# Patient Record
Sex: Male | Born: 1960
Health system: Southern US, Community
[De-identification: ages and names within clinical notes are randomized; demographics above are authoritative.]

## PROBLEM LIST (undated history)

## (undated) DIAGNOSIS — R011 Cardiac murmur, unspecified: Secondary | ICD-10-CM

## (undated) DIAGNOSIS — M2550 Pain in unspecified joint: Secondary | ICD-10-CM

## (undated) DIAGNOSIS — K219 Gastro-esophageal reflux disease without esophagitis: Secondary | ICD-10-CM

## (undated) DIAGNOSIS — F329 Major depressive disorder, single episode, unspecified: Secondary | ICD-10-CM

## (undated) DIAGNOSIS — E785 Hyperlipidemia, unspecified: Secondary | ICD-10-CM

## (undated) DIAGNOSIS — E119 Type 2 diabetes mellitus without complications: Secondary | ICD-10-CM

## (undated) DIAGNOSIS — F32A Depression, unspecified: Secondary | ICD-10-CM

## (undated) DIAGNOSIS — T7840XA Allergy, unspecified, initial encounter: Secondary | ICD-10-CM

## (undated) DIAGNOSIS — I10 Essential (primary) hypertension: Secondary | ICD-10-CM

## (undated) DIAGNOSIS — G4730 Sleep apnea, unspecified: Secondary | ICD-10-CM

## (undated) HISTORY — DX: Hyperlipidemia, unspecified: E78.5

## (undated) HISTORY — PX: WISDOM TOOTH EXTRACTION: SHX21

## (undated) HISTORY — DX: Major depressive disorder, single episode, unspecified: F32.9

## (undated) HISTORY — DX: Pain in unspecified joint: M25.50

## (undated) HISTORY — DX: Gastro-esophageal reflux disease without esophagitis: K21.9

## (undated) HISTORY — DX: Cardiac murmur, unspecified: R01.1

## (undated) HISTORY — DX: Allergy, unspecified, initial encounter: T78.40XA

## (undated) HISTORY — DX: Depression, unspecified: F32.A

## (undated) HISTORY — PX: OTHER SURGICAL HISTORY: SHX169

## (undated) SURGERY — LEFT HEART CATH
Anesthesia: Moderate Sedation

---

## 2008-06-24 ENCOUNTER — Other Ambulatory Visit: Payer: Self-pay

## 2008-06-24 ENCOUNTER — Emergency Department: Payer: Self-pay | Admitting: Emergency Medicine

## 2014-06-25 ENCOUNTER — Ambulatory Visit: Payer: Self-pay | Admitting: General Practice

## 2015-06-18 ENCOUNTER — Encounter: Payer: Self-pay | Admitting: *Deleted

## 2015-06-21 ENCOUNTER — Encounter
Admission: RE | Disposition: A | Discharge status: Home / Self Care | Payer: Self-pay | Source: Ambulatory Visit | Attending: Gastroenterology

## 2015-06-21 ENCOUNTER — Ambulatory Visit: Payer: BLUE CROSS/BLUE SHIELD | Admitting: Anesthesiology

## 2015-06-21 ENCOUNTER — Ambulatory Visit
Admission: RE | Admit: 2015-06-21 | Discharge: 2015-06-21 | Disposition: A | Discharge status: Home / Self Care | Payer: BLUE CROSS/BLUE SHIELD | Source: Ambulatory Visit | Attending: Gastroenterology | Admitting: Gastroenterology

## 2015-06-21 ENCOUNTER — Encounter: Payer: Self-pay | Admitting: *Deleted

## 2015-06-21 DIAGNOSIS — E119 Type 2 diabetes mellitus without complications: Secondary | ICD-10-CM | POA: Diagnosis not present

## 2015-06-21 DIAGNOSIS — Z538 Procedure and treatment not carried out for other reasons: Secondary | ICD-10-CM | POA: Diagnosis not present

## 2015-06-21 DIAGNOSIS — Z8371 Family history of colonic polyps: Secondary | ICD-10-CM | POA: Insufficient documentation

## 2015-06-21 DIAGNOSIS — Z79899 Other long term (current) drug therapy: Secondary | ICD-10-CM | POA: Insufficient documentation

## 2015-06-21 DIAGNOSIS — Z1211 Encounter for screening for malignant neoplasm of colon: Secondary | ICD-10-CM | POA: Insufficient documentation

## 2015-06-21 DIAGNOSIS — G4730 Sleep apnea, unspecified: Secondary | ICD-10-CM | POA: Diagnosis not present

## 2015-06-21 HISTORY — PX: COLONOSCOPY WITH PROPOFOL: SHX5780

## 2015-06-21 HISTORY — DX: Sleep apnea, unspecified: G47.30

## 2015-06-21 HISTORY — DX: Type 2 diabetes mellitus without complications: E11.9

## 2015-06-21 LAB — GLUCOSE, CAPILLARY: Glucose-Capillary: 143 mg/dL — ABNORMAL HIGH (ref 65–99)

## 2015-06-21 SURGERY — COLONOSCOPY WITH PROPOFOL
Anesthesia: General

## 2015-06-21 MED ORDER — FENTANYL CITRATE (PF) 100 MCG/2ML IJ SOLN
INTRAMUSCULAR | Status: DC | PRN
  Administered 2015-06-21: 50 ug via INTRAVENOUS

## 2015-06-21 MED ORDER — SODIUM CHLORIDE 0.9 % IV SOLN
INTRAVENOUS | Status: DC
  Administered 2015-06-21: 08:00:00 via INTRAVENOUS
  Administered 2015-06-21: 1000 mL via INTRAVENOUS

## 2015-06-21 MED ORDER — SODIUM CHLORIDE 0.9 % IV SOLN: INTRAVENOUS | Status: DC

## 2015-06-21 MED ORDER — PROPOFOL 10 MG/ML IV BOLUS
INTRAVENOUS | Status: DC | PRN
  Administered 2015-06-21 (×2): 50 mg via INTRAVENOUS

## 2015-06-21 MED ORDER — PROPOFOL INFUSION 10 MG/ML OPTIME
INTRAVENOUS | Status: DC | PRN
  Administered 2015-06-21: 180 ug/kg/min via INTRAVENOUS

## 2015-06-21 MED ORDER — MIDAZOLAM HCL 5 MG/5ML IJ SOLN
INTRAMUSCULAR | Status: DC | PRN
  Administered 2015-06-21: 1 mg via INTRAVENOUS

## 2015-06-21 MED ORDER — LIDOCAINE HCL (CARDIAC) 20 MG/ML IV SOLN
INTRAVENOUS | Status: DC | PRN
  Administered 2015-06-21: 30 mg via INTRAVENOUS

## 2015-06-21 NOTE — Anesthesia Preprocedure Evaluation (Signed)
Anesthesia Evaluation  Patient identified by MRN, date of birth, ID band Patient awake    Reviewed: Allergy & Precautions, H&P , NPO status , Patient's Chart, lab work & pertinent test results, reviewed documented beta blocker date and time   Airway Mallampati: III  TM Distance: >3 FB Neck ROM: full    Dental no notable dental hx.    Pulmonary neg pulmonary ROS, sleep apnea ,  breath sounds clear to auscultation  Pulmonary exam normal       Cardiovascular Exercise Tolerance: Good negative cardio ROS  Rhythm:regular Rate:Normal     Neuro/Psych negative neurological ROS  negative psych ROS   GI/Hepatic negative GI ROS, Neg liver ROS,   Endo/Other  negative endocrine ROSdiabetes  Renal/GU negative Renal ROS  negative genitourinary   Musculoskeletal   Abdominal   Peds  Hematology negative hematology ROS (+)   Anesthesia Other Findings   Reproductive/Obstetrics negative OB ROS                             Anesthesia Physical Anesthesia Plan  ASA: III  Anesthesia Plan: General   Post-op Pain Management:    Induction:   Airway Management Planned:   Additional Equipment:   Intra-op Plan:   Post-operative Plan:   Informed Consent: I have reviewed the patients History and Physical, chart, labs and discussed the procedure including the risks, benefits and alternatives for the proposed anesthesia with the patient or authorized representative who has indicated his/her understanding and acceptance.   Dental Advisory Given  Plan Discussed with: CRNA  Anesthesia Plan Comments:         Anesthesia Quick Evaluation

## 2015-06-21 NOTE — Addendum Note (Signed)
Addendum  created 06/21/15 0934 by Charna Busman, CRNA   Modules edited: Anesthesia Attestations, Notes Section   Notes Section:  Pend: [id number]

## 2015-06-21 NOTE — Anesthesia Postprocedure Evaluation (Signed)
  Anesthesia Post-op Note  Patient: Kevin Reid  Procedure(s) Performed: Procedure(s): COLONOSCOPY WITH PROPOFOL (N/A)  Anesthesia type:General  Patient location: PACU  Post pain: Pain level controlled  Post assessment: Post-op Vital signs reviewed, Patient's Cardiovascular Status Stable, Respiratory Function Stable, Patent Airway and No signs of Nausea or vomiting  Post vital signs: Reviewed and stable  Last Vitals:  Filed Vitals:   06/21/15 0904  BP:   Pulse: 45  Temp: 35.8 C  Resp: 16    Level of consciousness: awake, alert  and patient cooperative  Complications: No apparent anesthesia complications

## 2015-06-21 NOTE — Op Note (Signed)
[facility] Gastroenterology Patient Name: Kevin Reid Procedure Date: 06/21/2015 7:48 AM MRN: [id number] Account #: [id number] Date of Birth: Oct 17, 1961 Admit Type: Outpatient Age: 54 Room: [facility] ENDO ROOM 2 Gender: Male Note Status: Finalized Procedure:         Colonoscopy Indications:       Family history of colonic polyps in a first-degree relative Providers:         Christena Deem, MD Medicines:         Monitored Anesthesia Care Complications:     No immediate complications. Procedure:         Pre-Anesthesia Assessment:                    - ASA Grade Assessment: III - A patient with severe                     systemic disease.                    After obtaining informed consent, the colonoscope was                     passed under direct vision. Throughout the procedure, the                     patient's blood pressure, pulse, and oxygen saturations                     were monitored continuously. The Olympus PCF-160AL                     colonoscope (S#. [id number]) was introduced through the anus                     with the intention of advancing to the cecum. The scope                     was advanced to the sigmoid colon before the procedure was                     aborted. Medications were given. The patient tolerated the                     procedure well. The quality of the bowel preparation was                     poor. Findings:      aborted due in mid to upper sigmoid to poor prep. Impression:        - Preparation of the colon was poor.                    - No specimens collected.                    - The procedure was aborted due to poor bowel prep with                     stool present. Recommendation:    - Discharge patient to home.                    - Put patient on a clear liquid diet starting today.                    - will need repeat prep  and rescheduling. Procedure Code(s): --- Professional ---                    [phone],  Sigmoidoscopy, flexible; diagnostic, including                     collection of specimen(s) by brushing or washing, when                     performed (separate procedure) Diagnosis Code(s): --- Professional ---                    V18.51, Family history of colonic polyps                    V64.3, Procedure not carried out for other reasons CPT copyright 2014 American [employer]. All rights reserved. The codes documented in this report are preliminary and upon coder review may  be revised to meet current compliance requirements. Christena Deem, MD 06/21/2015 8:52:34 AM This report has been signed electronically. Number of Addenda: 0 Note Initiated On: 06/21/2015 7:48 AM Total Procedure Duration: 0 hours 6 minutes 54 seconds       [facility]

## 2015-06-21 NOTE — Transfer of Care (Signed)
Immediate Anesthesia Transfer of Care Note  Patient: Kevin GreeningJeffrey Reid  Procedure(s) Performed: Procedure(s): COLONOSCOPY WITH PROPOFOL (N/A)  Patient Location: PACU and Endoscopy Unit  Anesthesia Type:General  Level of Consciousness: awake, oriented and patient cooperative  Airway & Oxygen Therapy: Patient Spontanous Breathing and Patient connected to nasal cannula oxygen  Post-op Assessment: Report given to RN  Post vital signs: Reviewed and stable  Last Vitals:  Filed Vitals:   06/21/15 0904  BP:   Pulse: 45  Temp: 35.8 C  Resp: 16    Complications: No apparent anesthesia complications

## 2015-06-21 NOTE — Anesthesia Postprocedure Evaluation (Signed)
  Anesthesia Post-op Note  Patient: Kevin Reid  Procedure(s) Performed: Procedure(s): COLONOSCOPY WITH PROPOFOL (N/A)  Anesthesia type:General  Patient location: PACU  Post pain: Pain level controlled  Post assessment: Post-op Vital signs reviewed, Patient's Cardiovascular Status Stable, Respiratory Function Stable, Patent Airway and No signs of Nausea or vomiting  Post vital signs: Reviewed and stable  Last Vitals:  Filed Vitals:   06/21/15 0910  BP: 138/88  Pulse: 44  Temp:   Resp: 11    Level of consciousness: awake, alert  and patient cooperative  Complications: No apparent anesthesia complications

## 2015-06-21 NOTE — H&P (Signed)
Outpatient short stay form Pre-procedure 06/21/2015 8:12 AM Christena Deem MD  Primary Physician: Dr. Jabier Mutton  Reason for visit:  Colonoscopy  History of present illness:  Patient is a 54 year old Caucasian male who is presenting today for a screening colonoscopy. This is his first colonoscopy. He does have a family history of colon polyps and a primary relative. He tolerated his prep well. He is not currently taking any aspirin or the coagulates.    Current facility-administered medications:  .  0.9 %  sodium chloride infusion, , Intravenous, Continuous, Christena Deem, MD, Last Rate: 50 mL/hr at 06/21/15 0753, 1,000 mL at 06/21/15 0753 .  0.9 %  sodium chloride infusion, , Intravenous, Continuous, Christena Deem, MD  Prescriptions prior to admission  Medication Sig Dispense Refill Last Dose  . amLODipine (NORVASC) 10 MG tablet Take 10 mg by mouth daily.  3   . bisoprolol-hydrochlorothiazide (ZIAC) 5-6.25 MG per tablet Take 2 tablets by mouth daily.  3   . fenofibrate 160 MG tablet Take 160 mg by mouth daily.  11   . metFORMIN (GLUCOPHAGE) 1000 MG tablet Take 1,000 mg by mouth 2 (two) times daily.  1   . ONGLYZA 5 MG TABS tablet Take 5 mg by mouth daily.  99   . PARoxetine (PAXIL) 20 MG tablet TAKE 3 TABLETS EVERY DAY  12      No Known Allergies   Past Medical History  Diagnosis Date  . Diabetes mellitus without complication   . Sleep apnea     Review of systems:      Physical Exam    Heart and lungs: Regular rate and rhythm without rub or gallop, lungs are bilaterally clear    HEENT: Normocephalic atraumatic eyes are anicteric    Other:     Pertinant exam for procedure: Soft nontender nondistended bowel sounds positive normoactive    Planned proceedures: Colonoscopy and indicated procedures I have discussed the risks benefits and complications of procedures to include not limited to bleeding, infection, perforation and the risk of sedation and the  patient wishes to proceed.    Christena Deem, MD Gastroenterology 06/21/2015  8:12 AM

## 2015-06-21 NOTE — Anesthesia Postprocedure Evaluation (Signed)
  Anesthesia Post-op Note  Patient: Kevin Reid  Procedure(s) Performed: Procedure(s): COLONOSCOPY WITH PROPOFOL (N/A)  Anesthesia type:General  Patient location: PACU  Post pain: Pain level controlled  Post assessment: Post-op Vital signs reviewed, Patient's Cardiovascular Status Stable, Respiratory Function Stable, Patent Airway and No signs of Nausea or vomiting  Post vital signs: Reviewed and stable  Last Vitals:  Filed Vitals:   06/21/15 0910  BP: 138/88  Pulse: 44  Temp:   Resp: 11    Level of consciousness: awake, alert  and patient cooperative  Complications: No apparent anesthesia complications  

## 2015-07-20 ENCOUNTER — Ambulatory Visit: Payer: BLUE CROSS/BLUE SHIELD | Admitting: Anesthesiology

## 2015-07-20 ENCOUNTER — Encounter
Admission: RE | Disposition: A | Discharge status: Home / Self Care | Payer: Self-pay | Source: Ambulatory Visit | Attending: Gastroenterology

## 2015-07-20 ENCOUNTER — Ambulatory Visit
Admission: RE | Admit: 2015-07-20 | Discharge: 2015-07-20 | Disposition: A | Discharge status: Home / Self Care | Payer: BLUE CROSS/BLUE SHIELD | Source: Ambulatory Visit | Attending: Gastroenterology | Admitting: Gastroenterology

## 2015-07-20 ENCOUNTER — Encounter: Payer: Self-pay | Admitting: *Deleted

## 2015-07-20 DIAGNOSIS — Z8371 Family history of colonic polyps: Secondary | ICD-10-CM | POA: Diagnosis not present

## 2015-07-20 DIAGNOSIS — G4730 Sleep apnea, unspecified: Secondary | ICD-10-CM | POA: Diagnosis not present

## 2015-07-20 DIAGNOSIS — Z1211 Encounter for screening for malignant neoplasm of colon: Secondary | ICD-10-CM | POA: Insufficient documentation

## 2015-07-20 DIAGNOSIS — E119 Type 2 diabetes mellitus without complications: Secondary | ICD-10-CM | POA: Diagnosis not present

## 2015-07-20 HISTORY — PX: COLONOSCOPY WITH PROPOFOL: SHX5780

## 2015-07-20 LAB — GLUCOSE, CAPILLARY: GLUCOSE-CAPILLARY: 142 mg/dL — AB (ref 65–99)

## 2015-07-20 SURGERY — COLONOSCOPY WITH PROPOFOL
Anesthesia: General

## 2015-07-20 MED ORDER — SODIUM CHLORIDE 0.9 % IV SOLN: INTRAVENOUS | Status: DC

## 2015-07-20 MED ORDER — GLYCOPYRROLATE 0.2 MG/ML IJ SOLN
INTRAMUSCULAR | Status: DC | PRN
  Administered 2015-07-20: 0.2 mg via INTRAVENOUS

## 2015-07-20 MED ORDER — LACTATED RINGERS IV SOLN
INTRAVENOUS | Status: DC | PRN
  Administered 2015-07-20: 15:00:00 via INTRAVENOUS

## 2015-07-20 MED ORDER — PROPOFOL 10 MG/ML IV BOLUS
INTRAVENOUS | Status: DC | PRN
  Administered 2015-07-20: 430 mg via INTRAVENOUS

## 2015-07-20 MED ORDER — SODIUM CHLORIDE 0.9 % IV SOLN
INTRAVENOUS | Status: DC
  Administered 2015-07-20: 14:00:00 via INTRAVENOUS

## 2015-07-20 NOTE — Anesthesia Preprocedure Evaluation (Addendum)
Anesthesia Evaluation  Patient identified by MRN, date of birth, ID band Patient awake    Reviewed: Allergy & Precautions, NPO status , Patient's Chart, lab work & pertinent test results, reviewed documented beta blocker date and time   Airway Mallampati: II  TM Distance: >3 FB     Dental  (+) Chipped   Pulmonary sleep apnea ,          Cardiovascular     Neuro/Psych    GI/Hepatic   Endo/Other  diabetes, Type 2  Renal/GU      Musculoskeletal   Abdominal   Peds  Hematology   Anesthesia Other Findings He will use CPAP tonite. There will be someone with him tonite.  Reproductive/Obstetrics                            Anesthesia Physical Anesthesia Plan  ASA: II  Anesthesia Plan: General   Post-op Pain Management:    Induction: Intravenous  Airway Management Planned: Nasal Cannula  Additional Equipment:   Intra-op Plan:   Post-operative Plan:   Informed Consent: I have reviewed the patients History and Physical, chart, labs and discussed the procedure including the risks, benefits and alternatives for the proposed anesthesia with the patient or authorized representative who has indicated his/her understanding and acceptance.     Plan Discussed with: CRNA  Anesthesia Plan Comments:         Anesthesia Quick Evaluation

## 2015-07-20 NOTE — Op Note (Signed)
[facility] Gastroenterology Patient Name: Kevin Reid Procedure Date: 07/20/2015 2:24 PM MRN: [id number] Account #: [id number] Date of Birth: 1961-12-24 Admit Type: Outpatient Age: 54 Room: [facility] ENDO ROOM 4 Gender: Male Note Status: Finalized Procedure:         Colonoscopy Indications:       Colon cancer screening in patient at increased risk:                     Family history of colon polyps Providers:         Christena Deem, MD Referring MD:      Jabier Mutton, MD (Referring MD) Medicines:         Monitored Anesthesia Care Complications:     No immediate complications. Procedure:         Pre-Anesthesia Assessment:                    - ASA Grade Assessment: III - A patient with severe                     systemic disease.                    After obtaining informed consent, the colonoscope was                     passed under direct vision. Throughout the procedure, the                     patient's blood pressure, pulse, and oxygen saturations                     were monitored continuously. The Colonoscope was                     introduced through the anus and advanced to the the cecum,                     identified by appendiceal orifice and ileocecal valve. The                     colonoscopy was performed without difficulty. The patient                     tolerated the procedure well. The quality of the bowel                     preparation was good. Findings:      The colon (entire examined portion) appeared normal. Very mild melanosis       coli throughout.      The retroflexed view of the distal rectum and anal verge was normal and       showed no anal or rectal abnormalities.      The digital rectal exam was normal. Impression:        - The entire examined colon is normal.                    - The distal rectum and anal verge are normal on                     retroflexion view.                    - No specimens  collected. Recommendation:    - Repeat  colonoscopy in 5 years for screening purposes. Procedure Code(s): --- Professional ---                    [phone], Colonoscopy, flexible; diagnostic, including                     collection of specimen(s) by brushing or washing, when                     performed (separate procedure) Diagnosis Code(s): --- Professional ---                    V18.51, Family history of colonic polyps CPT copyright 2014 American Medical Association. All rights reserved. The codes documented in this report are preliminary and upon coder review may  be revised to meet current compliance requirements. Christena Deem, MD 07/20/2015 3:06:42 PM This report has been signed electronically. Number of Addenda: 0 Note Initiated On: 07/20/2015 2:24 PM Scope Withdrawal Time: 0 hours 9 minutes 25 seconds  Total Procedure Duration: 0 hours 19 minutes 16 seconds       [facility]

## 2015-07-20 NOTE — Transfer of Care (Signed)
Immediate Anesthesia Transfer of Care Note  Patient: Kevin Reid  Procedure(s) Performed: Procedure(s): COLONOSCOPY WITH PROPOFOL (N/A)  Patient Location: PACU  Anesthesia Type:General  Level of Consciousness: awake, alert  and oriented  Airway & Oxygen Therapy: Patient Spontanous Breathing and Patient connected to nasal cannula oxygen  Post-op Assessment: Report given to RN and Post -op Vital signs reviewed and stable  Post vital signs: Reviewed and stable  Last Vitals:  Filed Vitals:   07/20/15 1338  BP: 138/86  Pulse: 45  Temp: 36.6 C  Resp: 18    Complications: No apparent anesthesia complications

## 2015-07-20 NOTE — H&P (Addendum)
Outpatient short stay form Pre-procedure 07/20/2015 2:34 PM Kevin Deem MD  Primary Physician: Dr. Jabier Mutton  Reason for visit:  Colonoscopy  History of present illness:  Patient is a 54 year old male who is presenting today for screening colonoscopy. This would be a high risk screening since his is a family history of colon polyps in his father. He tolerated his prep well. The last time he took his 81 mg aspirin was 2 days ago. He has not taken any recent things like Goody powders etc. He does not take any anticoagulates drugs.    Current facility-administered medications:  .  0.9 %  sodium chloride infusion, , Intravenous, Continuous, Kevin Deem, MD, Last Rate: 50 mL/hr at 07/20/15 1353 .  0.9 %  sodium chloride infusion, , Intravenous, Continuous, Kevin Deem, MD  Prescriptions prior to admission  Medication Sig Dispense Refill Last Dose  . amLODipine (NORVASC) 10 MG tablet Take 10 mg by mouth daily.  3   . bisoprolol-hydrochlorothiazide (ZIAC) 5-6.25 MG per tablet Take 2 tablets by mouth daily.  3   . fenofibrate 160 MG tablet Take 160 mg by mouth daily.  11   . metFORMIN (GLUCOPHAGE) 1000 MG tablet Take 1,000 mg by mouth 2 (two) times daily.  1   . ONGLYZA 5 MG TABS tablet Take 5 mg by mouth daily.  99   . PARoxetine (PAXIL) 20 MG tablet TAKE 3 TABLETS EVERY DAY  12      No Known Allergies   Past Medical History  Diagnosis Date  . Diabetes mellitus without complication   . Sleep apnea     Review of systems:      Physical Exam    Heart and lungs: Regular rate and rhythm without rub murmur gallop, lungs are bilaterally clear    HEENT: Norm cephalic atraumatic eyes are anicteric    Other:     Pertinant exam for procedure: Protuberant soft nontender nondistended bowel sounds positive normoactive    Planned proceedures: Colonoscopy and indicated procedures.    Kevin Deem, MD Gastroenterology 07/20/2015  2:34 PM     I have  discussed the risks benefits and complications of procedures to include not limited to bleeding, infection, perforation and the risk of sedation and the patient wishes to proceed.

## 2015-07-20 NOTE — Anesthesia Postprocedure Evaluation (Signed)
  Anesthesia Post-op Note  Patient: Kevin Reid  Procedure(s) Performed: Procedure(s): COLONOSCOPY WITH PROPOFOL (N/A)  Anesthesia type:General  Patient location: PACU  Post pain: Pain level controlled  Post assessment: Post-op Vital signs reviewed, Patient's Cardiovascular Status Stable, Respiratory Function Stable, Patent Airway and No signs of Nausea or vomiting  Post vital signs: Reviewed and stable  Last Vitals:  Filed Vitals:   07/20/15 1537  BP: 150/90  Pulse: 51  Temp:   Resp: 20    Level of consciousness: awake, alert  and patient cooperative  Complications: No apparent anesthesia complications

## 2015-07-23 ENCOUNTER — Encounter: Payer: Self-pay | Admitting: Gastroenterology

## 2015-08-17 ENCOUNTER — Other Ambulatory Visit: Payer: Self-pay | Admitting: Physician Assistant

## 2015-11-15 ENCOUNTER — Other Ambulatory Visit: Payer: Self-pay | Admitting: Physician Assistant

## 2016-02-24 ENCOUNTER — Other Ambulatory Visit: Payer: Self-pay | Admitting: Physician Assistant

## 2016-03-01 ENCOUNTER — Other Ambulatory Visit: Payer: Self-pay | Admitting: Physician Assistant

## 2016-10-04 ENCOUNTER — Other Ambulatory Visit: Payer: Self-pay | Admitting: Physician Assistant

## 2016-10-04 NOTE — Telephone Encounter (Signed)
May not be your patient.

## 2016-10-13 ENCOUNTER — Other Ambulatory Visit: Payer: Self-pay | Admitting: Physician Assistant

## 2016-10-18 NOTE — Addendum Note (Signed)
Addendum  created 10/18/16 1239 by Charna Busmanhomas Tavita Eastham, CRNA   Delete clinical note

## 2016-12-23 ENCOUNTER — Other Ambulatory Visit: Payer: Self-pay | Admitting: Physician Assistant

## 2017-08-14 DIAGNOSIS — E113393 Type 2 diabetes mellitus with moderate nonproliferative diabetic retinopathy without macular edema, bilateral: Secondary | ICD-10-CM | POA: Diagnosis not present

## 2017-11-23 DIAGNOSIS — F422 Mixed obsessional thoughts and acts: Secondary | ICD-10-CM | POA: Diagnosis not present

## 2018-03-04 DIAGNOSIS — F4322 Adjustment disorder with anxiety: Secondary | ICD-10-CM | POA: Diagnosis not present

## 2018-04-09 DIAGNOSIS — F4322 Adjustment disorder with anxiety: Secondary | ICD-10-CM | POA: Diagnosis not present

## 2018-05-09 LAB — BASIC METABOLIC PANEL
BUN: 18 (ref 4–21)
Potassium: 4.1 (ref 3.4–5.3)
SODIUM: 140 (ref 137–147)

## 2018-05-09 LAB — LIPID PANEL
Cholesterol: 161 (ref 0–200)
HDL: 28 — AB (ref 35–70)
LDL Cholesterol: 76
LDl/HDL Ratio: 5.8
Triglycerides: 286 — AB (ref 40–160)

## 2018-05-09 LAB — TSH: TSH: 1.7 (ref 0.41–5.90)

## 2018-05-09 LAB — HEMOGLOBIN A1C: Hemoglobin A1C: 6.5

## 2018-07-07 ENCOUNTER — Other Ambulatory Visit: Payer: Self-pay

## 2018-07-07 ENCOUNTER — Emergency Department
Admission: EM | Admit: 2018-07-07 | Discharge: 2018-07-07 | Disposition: A | Discharge status: Home / Self Care | Payer: BLUE CROSS/BLUE SHIELD | Attending: Emergency Medicine | Admitting: Emergency Medicine

## 2018-07-07 ENCOUNTER — Emergency Department: Payer: BLUE CROSS/BLUE SHIELD

## 2018-07-07 DIAGNOSIS — I10 Essential (primary) hypertension: Secondary | ICD-10-CM | POA: Diagnosis not present

## 2018-07-07 DIAGNOSIS — E119 Type 2 diabetes mellitus without complications: Secondary | ICD-10-CM | POA: Diagnosis not present

## 2018-07-07 DIAGNOSIS — Z79899 Other long term (current) drug therapy: Secondary | ICD-10-CM | POA: Insufficient documentation

## 2018-07-07 DIAGNOSIS — R079 Chest pain, unspecified: Secondary | ICD-10-CM | POA: Diagnosis present

## 2018-07-07 DIAGNOSIS — R1013 Epigastric pain: Secondary | ICD-10-CM | POA: Diagnosis not present

## 2018-07-07 DIAGNOSIS — R0602 Shortness of breath: Secondary | ICD-10-CM | POA: Diagnosis not present

## 2018-07-07 DIAGNOSIS — Z7984 Long term (current) use of oral hypoglycemic drugs: Secondary | ICD-10-CM | POA: Diagnosis not present

## 2018-07-07 DIAGNOSIS — R0789 Other chest pain: Secondary | ICD-10-CM | POA: Diagnosis not present

## 2018-07-07 HISTORY — DX: Essential (primary) hypertension: I10

## 2018-07-07 LAB — CBC
HEMATOCRIT: 43 % (ref 40.0–52.0)
Hemoglobin: 15.4 g/dL (ref 13.0–18.0)
MCH: 31.3 pg (ref 26.0–34.0)
MCHC: 35.8 g/dL (ref 32.0–36.0)
MCV: 87.3 fL (ref 80.0–100.0)
PLATELETS: 172 10*3/uL (ref 150–440)
RBC: 4.92 MIL/uL (ref 4.40–5.90)
RDW: 13.5 % (ref 11.5–14.5)
WBC: 11.9 10*3/uL — ABNORMAL HIGH (ref 3.8–10.6)

## 2018-07-07 LAB — BASIC METABOLIC PANEL
Anion gap: 11 (ref 5–15)
BUN: 22 mg/dL — AB (ref 6–20)
CO2: 27 mmol/L (ref 22–32)
CREATININE: 1.11 mg/dL (ref 0.61–1.24)
Calcium: 9.2 mg/dL (ref 8.9–10.3)
Chloride: 100 mmol/L (ref 98–111)
GLUCOSE: 166 mg/dL — AB (ref 70–99)
Potassium: 3.5 mmol/L (ref 3.5–5.1)
Sodium: 138 mmol/L (ref 135–145)

## 2018-07-07 LAB — HEPATIC FUNCTION PANEL
ALT: 28 U/L (ref 0–44)
AST: 23 U/L (ref 15–41)
Albumin: 4.4 g/dL (ref 3.5–5.0)
Alkaline Phosphatase: 43 U/L (ref 38–126)
BILIRUBIN TOTAL: 0.7 mg/dL (ref 0.3–1.2)
Bilirubin, Direct: <0.1 mg/dL (ref 0.0–0.2)
Total Protein: 7 g/dL (ref 6.5–8.1)

## 2018-07-07 LAB — LIPASE, BLOOD: LIPASE: 26 U/L (ref 11–51)

## 2018-07-07 LAB — TROPONIN I
Troponin I: <0.03 ng/mL (ref <0.03)
Troponin I: <0.03 ng/mL (ref <0.03)

## 2018-07-07 NOTE — ED Notes (Signed)
First Nurse Note: Pt c/o chest discomfort and fatigue. Pt is in NAD at this time.

## 2018-07-07 NOTE — ED Triage Notes (Signed)
Pt arrives to ED alert, oriented, ambulatory. States central CP since 3:30 this AM with diarrhea. Denies any other symptoms. Does not radiate.

## 2018-07-07 NOTE — ED Provider Notes (Addendum)
[facility] Emergency Department Provider Note  ____________________________________________   I have reviewed the triage vital signs and the nursing notes. Where available I have reviewed prior notes and, if possible and indicated, outside hospital notes.    HISTORY  Chief Complaint Chest Pain    HPI Kevin Reid is a 57 y.o. male with a history of diabetes mellitus hypertension, also drinks 10-12 beers on the weekend, and a time.  History of tobacco abuse, 1 pack/month approximately.  Presents today complaining of having had an epigastric and lower chest discomfort while he was sleeping last night.  He was sound asleep and he woke up felt this discomfort which is like a tightness, it lasted for a few minutes, felt he had to burp, then he had diarrhea.  Nonbloody nonbilious.  After the diarrhea he felt better went back to bed.  He did have several beers yesterday, and he had very spicy [place] food.  He denies any radiation of the discomfort he denies any actual chest pain he just felt a little bit tight.  He denies any exertional symptoms.  Patient goes up and down stairs regularly at work without any dyspnea or chest pain.  He states he had something similar like this means lying down several months ago in similar situation.  He denies any ongoing symptoms since that time.  This happened at 330 he is been without discomfort since then.  He does not have any recurrent epigastric or abdominal pain.  He has no food related pain.  He does not regularly take antiacid's.  He states that the food that he had was "very spicy" and he did wash down with "several beers" he has no personal or family history of PE, DVT, or ACS.  His parents are both alive and well and his father has prostate disorder. States he had a negative colonoscopy but not had an endoscopy. He had the same episode while sleeping several months ago, EMS came he had a normal EKG and he was not transported at  that time.  He has had no intervening symptoms.  Again he is able to exert himself with no difficulty  Past Medical History:  Diagnosis Date  . Diabetes mellitus without complication (HCC)   . Hypertension   . Sleep apnea     There are no active problems to display for this patient.   Past Surgical History:  Procedure Laterality Date  . carpel tunnel    . COLONOSCOPY WITH PROPOFOL N/A 06/21/2015   Procedure: COLONOSCOPY WITH PROPOFOL;  Surgeon: Christena Deem, MD;  Location: [facility] ENDOSCOPY;  Service: Endoscopy;  Laterality: N/A;  . COLONOSCOPY WITH PROPOFOL N/A 07/20/2015   Procedure: COLONOSCOPY WITH PROPOFOL;  Surgeon: Christena Deem, MD;  Location: [facility] ENDOSCOPY;  Service: Endoscopy;  Laterality: N/A;    Prior to Admission medications   Medication Sig Start Date End Date Taking? Authorizing Provider  amLODipine (NORVASC) 10 MG tablet Take 10 mg by mouth daily. 05/24/15   [provider]  bisoprolol-hydrochlorothiazide (ZIAC) 5-6.25 MG per tablet Take 2 tablets by mouth daily. 06/09/15   [provider]  fenofibrate 160 MG tablet Take 160 mg by mouth daily. 05/29/15   [provider]  metFORMIN (GLUCOPHAGE) 1000 MG tablet Take 1,000 mg by mouth 2 (two) times daily. 04/06/15   [provider]  ONGLYZA 5 MG TABS tablet Take 5 mg by mouth daily. 05/03/15   [provider]  PARoxetine (PAXIL) 20 MG tablet TAKE 3 TABLETS  EVERY DAY 03/11/15   [provider]    Allergies Patient has no known allergies.  History reviewed. No pertinent family history.  Social History Social History   Tobacco Use  . Smoking status: Never Smoker  Substance Use Topics  . Alcohol use: Yes  . Drug use: No    Review of Systems Constitutional: No fever/chills Eyes: No visual changes. ENT: No sore throat. No stiff neck no neck pain Cardiovascular: See HPI regarding chest pain. Respiratory: Denies shortness of breath. Gastrointestinal:   no  vomiting.  No diarrhea.  No constipation. Genitourinary: Negative for dysuria. Musculoskeletal: Negative lower extremity swelling Skin: Negative for rash. Neurological: Negative for severe headaches, focal weakness or numbness.   ____________________________________________   PHYSICAL EXAM:  VITAL SIGNS: ED Triage Vitals  Enc Vitals Group     BP 07/07/18 0921 125/67     Pulse Rate 07/07/18 0921 (!) 53     Resp 07/07/18 0921 16     Temp 07/07/18 0921 98.2 F (36.8 C)     Temp Source 07/07/18 0921 Oral     SpO2 07/07/18 0921 95 %     Weight 07/07/18 0915 240 lb (108.9 kg)     Height 07/07/18 0915 6' (1.829 m)     Head Circumference --      Peak Flow --      Pain Score 07/07/18 0915 1     Pain Loc --      Pain Edu? --      Excl. in GC? --     Constitutional: Alert and oriented. Well appearing and in no acute distress. Eyes: Conjunctivae are normal Head: Atraumatic HEENT: No congestion/rhinnorhea. Mucous membranes are moist.  Oropharynx non-erythematous Neck:   Nontender with no meningismus, no masses, no stridor Cardiovascular: Normal rate, regular rhythm. Grossly normal heart sounds.  Good peripheral circulation. Respiratory: Normal respiratory effort.  No retractions. Lungs CTAB. Abdominal: Soft and nontender. No distention. No guarding no rebound Back:  There is no focal tenderness or step off.  there is no midline tenderness there are no lesions noted. there is no CVA tenderness Musculoskeletal: No lower extremity tenderness, no upper extremity tenderness. No joint effusions, no DVT signs strong distal pulses no edema Neurologic:  Normal speech and language. No gross focal neurologic deficits are appreciated.  Skin:  Skin is warm, dry and intact. No rash noted. Psychiatric: Mood and affect are normal. Speech and behavior are normal.  ____________________________________________   LABS (all labs ordered are listed, but only abnormal results are displayed)  Labs  Reviewed  BASIC METABOLIC PANEL - Abnormal; Notable for the following components:      Result Value   Glucose, Bld 166 (*)    BUN 22 (*)    All other components within normal limits  CBC - Abnormal; Notable for the following components:   WBC 11.9 (*)    All other components within normal limits  TROPONIN I  HEPATIC FUNCTION PANEL  LIPASE, BLOOD  TROPONIN I    Pertinent labs  results that were available during my care of the patient were reviewed by me and considered in my medical decision making (see chart for details). ____________________________________________  EKG  I personally interpreted any EKGs ordered by me or triage Sinus bradycardia rate 56, no acute ST elevation or depression, LAD noted, no acute ischemia. ____________________________________________  RADIOLOGY  Pertinent labs & imaging results that were available during my care of the patient were reviewed by me and considered in my  medical decision making (see chart for details). If possible, patient and/or family made aware of any abnormal findings.  Dg Chest 2 View  Result Date: 07/07/2018 CLINICAL DATA:  57 year old male with history of central chest pressures since this morning. No associated shortness of breath. EXAM: CHEST - 2 VIEW COMPARISON:  Chest x-ray 06/24/2018. FINDINGS: Lung volumes are normal. No consolidative airspace disease. No pleural effusions. No pneumothorax. No pulmonary nodule or mass noted. Pulmonary vasculature and the cardiomediastinal silhouette are within normal limits. IMPRESSION: No radiographic evidence of acute cardiopulmonary disease. Electronically Signed   By: Trudie Reed M.D.   On: 07/07/2018 10:03   ____________________________________________    PROCEDURES  Procedure(s) performed: None  Procedures  Critical Care performed: None  ____________________________________________   INITIAL IMPRESSION / ASSESSMENT AND PLAN / ED COURSE  Pertinent labs & imaging results  that were available during my care of the patient were reviewed by me and considered in my medical decision making (see chart for details).  Patient here with very atypical story of epigastric/lower chest discomfort after spicy food and beer accompanied by single episode of nonbloody nonbilious diarrhea.  This seems to be mostly GI in character, we have sent a second troponin, liver function tests are also pending as well as a lipase.  Abdomen is completely benign.  Differential for this includes myocarditis endocarditis pericarditis pneumonia pneumothorax PE ACS dissection AAA among other issues including gallbladder issues primary GI issues ulcer etc.  However at this time, he is completely asymptomatic.  Given that he does have a history of diabetes and high blood pressure we will send a second troponin although with a negative troponin after 8 hours I am very reassured.  Patient and I discussed admission to the hospital he is adamant that he does not want to stay.  He understands that there is a limit to our ability to monitor him if he goes home and given his risk factors for ACS, possibly the safest thing to do would be admitted but he is adamant that he would like to go home.  All this was extensively explained to him and his wife.  Given that he refuses to stay, and that his blood work is thus far reassuring, if his second troponin is negative, and given this very atypical story of abdominal pain and diarrhea completely resolved after having had spicy skin food and beer with a completely normal abdominal exam and reassuring work-up, we will see about trying to get him into a cardiologist as a precaution as well as GI.  Patient is very much amenable to this plan and willing to stay.  Return precautions and follow-up of already been discussed in the event that he goes home as he plans very much to do  ----------------------------------------- 1:38 PM on  07/07/2018 -----------------------------------------  Remains asymptomatic here, again after having 6 or 7 beers spicy [place] food he had a discomforting sensation in his upper abdomen lower chest followed by diarrhea feels better now.  Refuses admission.  At this time, there does not appear to be clinical evidence to support the diagnosis of pulmonary embolus, dissection, myocarditis, endocarditis, pericarditis, pericardial tamponade, acute coronary syndrome, pneumothorax, pneumonia, or any other acute intrathoracic pathology that will require admission or acute intervention. Nor is there evidence of any significant intra-abdominal pathology causing this discomfort.  Considering the patient's symptoms, medical history, and physical examination today, I have low suspicion for cholecystitis or biliary pathology, pancreatitis, perforation or bowel obstruction, hernia, intra-abdominal abscess, AAA or dissection,  volvulus or intussusception, mesenteric ischemia, ischemic gut, pyelonephritis or appendicitis.  That he refuses admission, we will discharge with close outpatient follow-up.  ----------------------------------------- 1:41 PM on 07/07/2018 -----------------------------------------  5 minutes spent on smoking cessation discussion    ____________________________________________   FINAL CLINICAL IMPRESSION(S) / ED DIAGNOSES  Final diagnoses:  None      This chart was dictated using voice recognition software.  Despite best efforts to proofread,  errors can occur which can change meaning.      Jeanmarie PlantMcShane, James A, MD 07/07/18 1234    Jeanmarie PlantMcShane, James A, MD 07/07/18 1339    Jeanmarie PlantMcShane, James A, MD 07/07/18 1341

## 2018-07-07 NOTE — ED Notes (Signed)
Pt states that at 330am he started with chest tightness, shortness of breath and diarrhea (x1 loose stool) Denies pain at this time - reports feeling of "muscle spasms in middle of chest" - denies shortness of breath - denies radiation of pain - denies N/V

## 2018-07-07 NOTE — Discharge Instructions (Addendum)
At this time you  would prefer not to be admitted to the hospital, which is your decision but does limit our ability to continue to monitor you.  For this reason we asked to be very vigilant about your health.  If you have any chest pain, shortness of breath, nausea, vomiting, you feel weak when you walk around have significant abdominal pain, bleeding, uncontrolled diarrhea, black stool, or any other concerns please return to the emergency room.  Do not smoke tobacco anymore.  Follow closely with GI medicine and cardiology as well as PCP.

## 2018-10-28 DIAGNOSIS — D235 Other benign neoplasm of skin of trunk: Secondary | ICD-10-CM | POA: Diagnosis not present

## 2018-10-28 DIAGNOSIS — L814 Other melanin hyperpigmentation: Secondary | ICD-10-CM | POA: Diagnosis not present

## 2018-10-28 DIAGNOSIS — D225 Melanocytic nevi of trunk: Secondary | ICD-10-CM | POA: Diagnosis not present

## 2018-11-19 ENCOUNTER — Encounter: Payer: Self-pay | Admitting: Family Medicine

## 2018-11-19 ENCOUNTER — Ambulatory Visit (INDEPENDENT_AMBULATORY_CARE_PROVIDER_SITE_OTHER): Payer: BLUE CROSS/BLUE SHIELD | Admitting: Family Medicine

## 2018-11-19 VITALS — BP 128/84 | HR 54 | Temp 98.3°F | Resp 10 | Ht 70.5 in | Wt 240.2 lb

## 2018-11-19 DIAGNOSIS — G4730 Sleep apnea, unspecified: Secondary | ICD-10-CM

## 2018-11-19 DIAGNOSIS — E1169 Type 2 diabetes mellitus with other specified complication: Secondary | ICD-10-CM | POA: Diagnosis not present

## 2018-11-19 DIAGNOSIS — I10 Essential (primary) hypertension: Secondary | ICD-10-CM

## 2018-11-19 DIAGNOSIS — E785 Hyperlipidemia, unspecified: Secondary | ICD-10-CM | POA: Insufficient documentation

## 2018-11-19 DIAGNOSIS — E119 Type 2 diabetes mellitus without complications: Secondary | ICD-10-CM | POA: Insufficient documentation

## 2018-11-19 NOTE — Assessment & Plan Note (Signed)
Per pt did not tolerate one statin. On Fenofibrate. Will get outside records and repeat lipids as needed

## 2018-11-19 NOTE — Assessment & Plan Note (Signed)
Pt will let us know what supplies and company to send request to

## 2018-11-19 NOTE — Assessment & Plan Note (Signed)
Stable on current medications. Will get outside records

## 2018-11-19 NOTE — Progress Notes (Signed)
Subjective:     Kevin Reid is a 57 y.o. male presenting for Establish Care (previous PCP provider with city of [place]. No concerns today. Had physical for 2019 and will need one for 2020) and Sleep Apnea (patient is on CPAP and this was started by Dr Dorothey Baseman years ago. he is having hard time getting response back from sleep med and wants to switch to the company his wife uses but they need an order from PCP. )     HPI  #Diabetes - last hgb a1c was in May 6.5% - on oral medications - does not check CBG - tries to watch what he eats  #HTN -no sob, ha, vision changes - taking medication w/o issues - endorses some chest discomfort in the setting of drinking caffeine  #Sleep apnea - has a CPAP - doing well, cannot sleep without his CPAP - needs new head gear - used to get supplies from a supply center which stopped selling them - wife has a CPAP and he could get supplies from that company  #Depression - feels like he is in a good place currently - doesn't think of it as a sickness - came off for about 1 year at one point in time     Review of Systems  See HPI   Social History   Tobacco Use  Smoking Status Former Smoker  . Packs/day: 1.00  . Years: 8.00  . Pack years: 8.00  . Types: Cigarettes  . Last attempt to quit: 04/24/2018  . Years since quitting: 0.5  Smokeless Tobacco Former Neurosurgeon  . Types: Chew  . Quit date: 11/19/2013  Tobacco Comment   occasionally has a cigarette with a drink, last time was around May 2019        Objective:    BP Readings from Last 3 Encounters:  11/19/18 128/84  07/07/18 134/80  07/20/15 (!) 144/92   Wt Readings from Last 3 Encounters:  11/19/18 240 lb 4 oz (109 kg)  07/07/18 240 lb (108.9 kg)  07/20/15 245 lb (111.1 kg)    BP 128/84   Pulse (!) 54   Temp 98.3 F (36.8 C)   Resp 10   Ht 5' 10.5" (1.791 m)   Wt 240 lb 4 oz (109 kg)   SpO2 95%   BMI 33.99 kg/m    Physical Exam  Constitutional: He  appears well-developed and well-nourished. No distress.  HENT:  Right Ear: External ear normal.  Left Ear: External ear normal.  Eyes: Conjunctivae and EOM are normal.  Neck: Neck supple.  Cardiovascular: Normal rate, regular rhythm and normal heart sounds.  No murmur heard. Pulmonary/Chest: Effort normal and breath sounds normal. No stridor. No respiratory distress.  Neurological: He is alert.  Skin: Skin is warm and dry. Capillary refill takes less than 2 seconds. He is not diaphoretic.  Psychiatric: He has a normal mood and affect.          Assessment & Plan:   Problem List Items Addressed This Visit      Cardiovascular and Mediastinum   Hypertension    BP at goal today      Relevant Medications   aspirin EC 81 MG tablet     Respiratory   Sleep apnea - Primary    Pt will let us know what supplies and company to send request to        Endocrine   Diabetes mellitus, type 2 (HCC)    Stable on current medications. Will  get outside records      Relevant Medications   Semaglutide, 1 MG/DOSE, (OZEMPIC, 1 MG/DOSE,) 2 MG/1.5ML SOPN   aspirin EC 81 MG tablet     Other   Hyperlipemia    Per pt did not tolerate one statin. On Fenofibrate. Will get outside records and repeat lipids as needed      Relevant Medications   aspirin EC 81 MG tablet       Return in about 2 months (around 01/19/2019).  Lynnda ChildJessica R , MD

## 2018-11-19 NOTE — Assessment & Plan Note (Signed)
BP at goal today.

## 2018-11-19 NOTE — Patient Instructions (Signed)
--   Call back with the name of the company you want them sent to and the details of the supplies that you  -- If the company has a form for approval ask them to send that as well

## 2018-11-26 ENCOUNTER — Telehealth: Payer: Self-pay | Admitting: Family Medicine

## 2018-11-26 ENCOUNTER — Other Ambulatory Visit: Payer: Self-pay | Admitting: Family Medicine

## 2018-11-26 NOTE — Telephone Encounter (Signed)
Patient advised.

## 2018-11-26 NOTE — Telephone Encounter (Signed)
See other message. Refill request came through from the pharmacy earlier today already.

## 2018-11-26 NOTE — Telephone Encounter (Signed)
Pt need refill Metformin    Sent to [facility] [place]

## 2018-11-26 NOTE — Telephone Encounter (Signed)
Please see refill request. This message originally was sent to Dr Sharen HonesGutierrez by mistake somehow. Thank you.

## 2018-12-05 ENCOUNTER — Other Ambulatory Visit: Payer: Self-pay | Admitting: Family Medicine

## 2018-12-05 NOTE — Telephone Encounter (Signed)
Please review request. Thank you.

## 2018-12-16 DIAGNOSIS — R112 Nausea with vomiting, unspecified: Secondary | ICD-10-CM | POA: Diagnosis not present

## 2018-12-16 DIAGNOSIS — A049 Bacterial intestinal infection, unspecified: Secondary | ICD-10-CM | POA: Diagnosis not present

## 2019-01-13 ENCOUNTER — Other Ambulatory Visit: Payer: Self-pay | Admitting: Family Medicine

## 2019-01-13 DIAGNOSIS — E1169 Type 2 diabetes mellitus with other specified complication: Secondary | ICD-10-CM

## 2019-01-13 DIAGNOSIS — Z1159 Encounter for screening for other viral diseases: Secondary | ICD-10-CM

## 2019-01-13 DIAGNOSIS — E785 Hyperlipidemia, unspecified: Secondary | ICD-10-CM

## 2019-01-13 DIAGNOSIS — I10 Essential (primary) hypertension: Secondary | ICD-10-CM

## 2019-01-15 ENCOUNTER — Other Ambulatory Visit (INDEPENDENT_AMBULATORY_CARE_PROVIDER_SITE_OTHER): Payer: BLUE CROSS/BLUE SHIELD

## 2019-01-15 DIAGNOSIS — E785 Hyperlipidemia, unspecified: Secondary | ICD-10-CM

## 2019-01-15 DIAGNOSIS — Z1159 Encounter for screening for other viral diseases: Secondary | ICD-10-CM

## 2019-01-15 DIAGNOSIS — E1169 Type 2 diabetes mellitus with other specified complication: Secondary | ICD-10-CM | POA: Diagnosis not present

## 2019-01-15 DIAGNOSIS — I10 Essential (primary) hypertension: Secondary | ICD-10-CM | POA: Diagnosis not present

## 2019-01-15 LAB — COMPREHENSIVE METABOLIC PANEL
ALBUMIN: 4.6 g/dL (ref 3.5–5.2)
ALT: 35 U/L (ref 0–53)
AST: 22 U/L (ref 0–37)
Alkaline Phosphatase: 47 U/L (ref 39–117)
BUN: 18 mg/dL (ref 6–23)
CO2: 31 mEq/L (ref 19–32)
Calcium: 9.8 mg/dL (ref 8.4–10.5)
Chloride: 98 mEq/L (ref 96–112)
Creatinine, Ser: 1.04 mg/dL (ref 0.40–1.50)
GFR: 73.74 mL/min (ref >60.00)
Glucose, Bld: 155 mg/dL — ABNORMAL HIGH (ref 70–99)
Potassium: 3.8 mEq/L (ref 3.5–5.1)
Sodium: 136 mEq/L (ref 135–145)
Total Bilirubin: 0.6 mg/dL (ref 0.2–1.2)
Total Protein: 7 g/dL (ref 6.0–8.3)

## 2019-01-15 LAB — LDL CHOLESTEROL, DIRECT: LDL DIRECT: 96 mg/dL

## 2019-01-15 LAB — LIPID PANEL
Cholesterol: 167 mg/dL (ref 0–200)
HDL: 29.3 mg/dL — ABNORMAL LOW (ref >39.00)
NonHDL: 137.41
Total CHOL/HDL Ratio: 6
Triglycerides: 338 mg/dL — ABNORMAL HIGH (ref 0.0–149.0)
VLDL: 67.6 mg/dL — ABNORMAL HIGH (ref 0.0–40.0)

## 2019-01-15 LAB — HEMOGLOBIN A1C: HEMOGLOBIN A1C: 7.2 % — AB (ref 4.6–6.5)

## 2019-01-16 LAB — HEPATITIS C ANTIBODY
Hepatitis C Ab: NONREACTIVE
SIGNAL TO CUT-OFF: 0.02 (ref <1.00)

## 2019-01-22 ENCOUNTER — Encounter: Payer: BLUE CROSS/BLUE SHIELD | Admitting: Family Medicine

## 2019-01-22 DIAGNOSIS — Z0289 Encounter for other administrative examinations: Secondary | ICD-10-CM

## 2019-01-30 ENCOUNTER — Encounter: Payer: Self-pay | Admitting: Family Medicine

## 2019-01-30 ENCOUNTER — Ambulatory Visit (INDEPENDENT_AMBULATORY_CARE_PROVIDER_SITE_OTHER): Payer: BLUE CROSS/BLUE SHIELD | Admitting: Family Medicine

## 2019-01-30 VITALS — BP 140/92 | HR 52 | Temp 97.6°F | Ht 70.25 in | Wt 264.2 lb

## 2019-01-30 DIAGNOSIS — Z7982 Long term (current) use of aspirin: Secondary | ICD-10-CM | POA: Insufficient documentation

## 2019-01-30 DIAGNOSIS — I10 Essential (primary) hypertension: Secondary | ICD-10-CM

## 2019-01-30 DIAGNOSIS — Z23 Encounter for immunization: Secondary | ICD-10-CM

## 2019-01-30 DIAGNOSIS — E785 Hyperlipidemia, unspecified: Secondary | ICD-10-CM | POA: Diagnosis not present

## 2019-01-30 DIAGNOSIS — Z0000 Encounter for general adult medical examination without abnormal findings: Secondary | ICD-10-CM | POA: Diagnosis not present

## 2019-01-30 DIAGNOSIS — E1169 Type 2 diabetes mellitus with other specified complication: Secondary | ICD-10-CM

## 2019-01-30 MED ORDER — SIMVASTATIN 5 MG PO TABS: 5.0000 mg | ORAL_TABLET | Freq: Every day | ORAL | 1 refills | Status: DC

## 2019-01-30 NOTE — Progress Notes (Signed)
Annual Exam   Chief Complaint:  Chief Complaint  Patient presents with  . Annual Exam    History of Present Illness:  Annell GreeningJeffrey Borras is a 58 y.o. presents today for annual examination.     Nutrition/Lifestyle Diet: doing well, trying to low carbs/low sugar Exercise: walking treadmill, 45 min, 3 miles/hr He is single partner, contraception - none and partner with medical issues making pregnancy unlikely.   Social History   Tobacco Use  Smoking Status Former Smoker  . Packs/day: 1.00  . Years: 8.00  . Pack years: 8.00  . Types: Cigarettes  . Last attempt to quit: 04/24/2018  . Years since quitting: 0.7  Smokeless Tobacco Former NeurosurgeonUser  . Types: Chew  . Quit date: 11/19/2013  Tobacco Comment   occasionally has a cigarette with a drink, last time was around May 2019     Social History   Substance and Sexual Activity  Alcohol Use Yes   Comment: not regularly currently   Social History   Substance and Sexual Activity  Drug Use No     Safety The patient wears seatbelts: yes.     The patient feels safe at home and in their relationships: yes.  General Health Dentist in the last year: No - last time was 5-6 years Eye doctor: due to for appointment  Weight Wt Readings from Last 3 Encounters:  01/30/19 264 lb 4 oz (119.9 kg)  11/19/18 240 lb 4 oz (109 kg)  07/07/18 240 lb (108.9 kg)   Patient has high BMI  BMI Readings from Last 1 Encounters:  01/30/19 37.65 kg/m     Chronic disease screening Blood pressure monitoring:  BP Readings from Last 3 Encounters:  01/30/19 (!) 140/92  11/19/18 128/84  07/07/18 134/80    Lipid Monitoring: Indication for screening: age >35, obesity, diabetes, family hx, CV risk factors.  Lipid screening: Yes  Lab Results  Component Value Date   CHOL 167 01/15/2019   HDL 29.30 (L) 01/15/2019   LDLCALC 76 05/09/2018   LDLDIRECT 96.0 01/15/2019   TRIG 338.0 (H) 01/15/2019   CHOLHDL 6 01/15/2019     The 10-year ASCVD  risk score Denman George(Goff DC Jr., et al., 2013) is: 19.6%   Values used to calculate the score:     Age: [age] years     Sex: Male     Is Non-Hispanic African American: No     Diabetic: Yes     Tobacco smoker: No     Systolic Blood Pressure: 140 mmHg     Is BP treated: Yes     HDL Cholesterol: 29.3 mg/dL     Total Cholesterol: 167 mg/dL   Diabetes Screening: age 45>40, overweight, family hx, PCOS, hx of gestational diabetes, at risk ethnicity, elevated blood pressure >135/80.  Diabetes Screening screening: Yes  Lab Results  Component Value Date   HGBA1C 7.2 (H) 01/15/2019     Prostate Cancer Screening: Yes Age 57-69 yo Shared Decision Making Higher Risk: Older age, African American, Family Hx of Prostate Cancer - Yes Benefits: screening may prevent 1.3 deaths from prostate cancer over 13 years per 1000 men screened and prevent 3 metastatic cases per 1000 men screened. Not enough evidence to support more benefit for AA or FMH Harms: False Positive and psychological harms. 15% of men with false positive over a 2 to 4 year period > resulting in biopsy and complications such as pain, hematospermia, infections. Overdiagnosis - increases with age - found that 20-50% of prostate cancer  through screening may have never caused any issues. Harms of treatment include - erectile dysfunction, urinary incontinence, and bothersome bowel symptoms.   After discussion he does want to get a PSA checked today.   Inadequate evidence for screening <55 No mortality benefit for screening >70   Colon Cancer Screening Age 51-75 yo - benefits outweigh the risk. Adults 84-85 yo who have never been screened benefit.  Benefits: 134000 people in 2016 will be diagnosed and 49,000 will die - early detection helps Harms: Complications 2/2 to colonoscopy High Risk (Colonoscopy): genetic disorder (Lynch syndrome or familial adenomatous polyposis), personal hx of IBD, previous adenomatous polyp, or previous colorectal cancer,  FamHx start 10 years before the age at diagnosis, increased in males and black race  Options:  FIT - looks for hemoglobin (blood in the stool) - specific and fairly sensitive - must be done annually Cologuard - looks for DNA and blood - more sensitive - therefore can have more false positives, every 3 years Colonoscopy - every 10 years if normal - sedation, bowl prep, must have someone drive you  Shared decision making and the patient had decided to do -- already up to date on screening.    Immunization History  Administered Date(s) Administered  . Tdap 05/14/2018    Past Medical History:  Diagnosis Date  . Allergy   . Depression   . Diabetes mellitus without complication (HCC)   . Heart murmur   . Hyperlipidemia   . Hypertension   . Sleep apnea     Past Surgical History:  Procedure Laterality Date  . carpel tunnel    . COLONOSCOPY WITH PROPOFOL N/A 06/21/2015   Procedure: COLONOSCOPY WITH PROPOFOL;  Surgeon: Christena Deem, MD;  Location: [facility] ENDOSCOPY;  Service: Endoscopy;  Laterality: N/A;  . COLONOSCOPY WITH PROPOFOL N/A 07/20/2015   Procedure: COLONOSCOPY WITH PROPOFOL;  Surgeon: Christena Deem, MD;  Location: [facility] ENDOSCOPY;  Service: Endoscopy;  Laterality: N/A;  . WISDOM TOOTH EXTRACTION      Prior to Admission medications   Medication Sig Start Date End Date Taking? Authorizing Provider  amLODipine (NORVASC) 10 MG tablet Take 10 mg by mouth daily. 05/24/15   [provider]  aspirin EC 81 MG tablet Take 81 mg by mouth daily.    [provider]  bisoprolol-hydrochlorothiazide [facility]) 5-6.25 MG tablet TAKE TWO TABLETS BY MOUTH EVERY DAY 12/05/18   Lynnda Child, MD  fenofibrate 160 MG tablet Take 160 mg by mouth daily. 05/29/15   [provider]  levocetirizine (XYZAL) 5 MG tablet Take 5 mg by mouth every evening.    [provider]  metFORMIN (GLUCOPHAGE) 1000 MG tablet TAKE 1 TABLET BY MOUTH TWICE DAILY 11/26/18   Lynnda Child, MD  NON FORMULARY CPAP MACHINE USING EVERY NIGHT    [provider]  PARoxetine (PAXIL) 20 MG tablet TAKE 3 TABLETS BY MOUTH DAILY 12/05/18   Lynnda Child, MD  Semaglutide, 1 MG/DOSE, (OZEMPIC, 1 MG/DOSE,) 2 MG/1.5ML SOPN Inject 50 mg into the skin once a week.    [provider]    No Known Allergies   Social History   Socioeconomic History  . Marital status: Married    Spouse name: Sherri  . Number of children: Not on file  . Years of education: high school  . Highest education level: Not on file  Occupational History  . Not on file  Social Needs  . Financial resource strain: Not hard at all  .  Food insecurity:    Worry: Not on file    Inability: Not on file  . Transportation needs:    Medical: Not on file    Non-medical: Not on file  Tobacco Use  . Smoking status: Former Smoker    Packs/day: 1.00    Years: 8.00    Pack years: 8.00    Types: Cigarettes    Last attempt to quit: 04/24/2018    Years since quitting: 0.7  . Smokeless tobacco: Former Neurosurgeon    Types: Chew    Quit date: 11/19/2013  . Tobacco comment: occasionally has a cigarette with a drink, last time was around May 2019  Substance and Sexual Activity  . Alcohol use: Yes    Comment: not regularly currently  . Drug use: No  . Sexual activity: Yes    Birth control/protection: None  Lifestyle  . Physical activity:    Days per week: Not on file    Minutes per session: Not on file  . Stress: Not on file  Relationships  . Social connections:    Talks on phone: Not on file    Gets together: Not on file    Attends religious service: Not on file    Active member of club or organization: Not on file    Attends meetings of clubs or organizations: Not on file    Relationship status: Not on file  . Intimate partner violence:    Fear of current or ex partner: Not on file    Emotionally abused: Not on file    Physically abused: Not on file    Forced sexual activity: Not on file   Other Topics Concern  . Not on file  Social History Narrative   Married to [employer]   Enjoys - shooting for fun, does not hunt as much anymore   Exercise- nothing regularly, needs to exercise   Diet - trying to eat healthy, follow the diabetic diet (avoid carbs)   Support system: wife    Family History  Problem Relation Age of Onset  . Prostate cancer Father 37  . Cancer Maternal Grandmother   . Heart attack Maternal Grandfather   . Stroke Paternal Grandmother        old  . Stomach cancer Paternal Grandfather     Review of Systems  Constitutional: Negative for chills and fever.  HENT: Negative for congestion and sore throat.   Eyes: Negative for blurred vision.  Respiratory: Negative for cough and shortness of breath.   Cardiovascular: Negative for chest pain and leg swelling.  Gastrointestinal: Negative for abdominal pain, heartburn, nausea and vomiting.       Bloated feeling  Genitourinary: Negative.   Musculoskeletal: Negative.   Skin: Negative for rash.  Neurological: Negative.   Endo/Heme/Allergies: Negative.   Psychiatric/Behavioral: Negative.      Physical Exam BP (!) 140/92   Pulse (!) 52   Temp 97.6 F (36.4 C)   Ht 5' 10.25" (1.784 m)   Wt 264 lb 4 oz (119.9 kg)   SpO2 96%   BMI 37.65 kg/m    BP Readings from Last 3 Encounters:  01/30/19 (!) 140/92  11/19/18 128/84  07/07/18 134/80      Physical Exam Constitutional:      General: He is not in acute distress.    Appearance: He is well-developed. He is not diaphoretic.  HENT:     Head: Normocephalic and atraumatic.     Right Ear: Tympanic membrane and ear canal normal.  Left Ear: Tympanic membrane and ear canal normal.     Nose: Nose normal.     Mouth/Throat:     Pharynx: Uvula midline.  Eyes:     General: No scleral icterus.    Conjunctiva/sclera: Conjunctivae normal.     Pupils: Pupils are equal, round, and reactive to light.  Neck:     Musculoskeletal: Normal range of motion and  neck supple.  Cardiovascular:     Rate and Rhythm: Normal rate and regular rhythm.     Heart sounds: Normal heart sounds. No murmur.  Pulmonary:     Effort: Pulmonary effort is normal. No respiratory distress.     Breath sounds: Normal breath sounds. No wheezing.  Abdominal:     General: Bowel sounds are normal. There is no distension.     Palpations: Abdomen is soft. There is no mass.     Tenderness: There is no abdominal tenderness. There is no guarding.  Musculoskeletal: Normal range of motion.  Lymphadenopathy:     Cervical: No cervical adenopathy.  Skin:    General: Skin is warm and dry.     Capillary Refill: Capillary refill takes less than 2 seconds.  Neurological:     Mental Status: He is alert and oriented to person, place, and time.        Results: PHQ-9:    [facility]  PHQ-9 Total Score  6        Assessment: 58 y.o. male here for routine annual physical examination.  Plan: Problem List Items Addressed This Visit      Cardiovascular and Mediastinum   Hypertension    BP elevated today. Recheck in 3 months - has started watching diet and exercise      Relevant Medications   simvastatin (ZOCOR) 5 MG tablet     Endocrine   Diabetes mellitus, type 2 (HCC)   Relevant Medications   simvastatin (ZOCOR) 5 MG tablet   Other Relevant Orders   Ambulatory referral to Ophthalmology   Pneumococcal polysaccharide vaccine 23-valent greater than or equal to 2yo subcutaneous/IM     Other   Hyperlipemia   Relevant Medications   simvastatin (ZOCOR) 5 MG tablet   Long-term use of aspirin therapy    Other Visit Diagnoses    Encounter for annual health examination    -  Primary   Need for 23-polyvalent pneumococcal polysaccharide vaccine       Relevant Orders   Pneumococcal polysaccharide vaccine 23-valent greater than or equal to 2yo subcutaneous/IM      Screening: -- Blood pressure screen elevated:  continued to monitor. -- cholesterol screening: elevated and will try another statin medication -- Weight screening: obese: discussed management options, including lifestyle, dietary, and exercise. -- Diabetes Screening: already has diabetes -- Nutrition: normal  The 10-year ASCVD risk score Denman George(Goff DC Jr., et al., 2013) is: 19.6%   Values used to calculate the score:     Age: [age] years     Sex: Male     Is Non-Hispanic African American: No     Diabetic: Yes     Tobacco smoker: No     Systolic Blood Pressure: 140 mmHg     Is BP treated: Yes     HDL Cholesterol: 29.3 mg/dL     Total Cholesterol: 167 mg/dL  -- ASA 81 mg discussed if CVD risk >10% age 57-59 and willing to take for 10 years -- Statin therapy for Age 57-75 with CVD  risk >7.5%  Psych -- Depression screening (PHQ-9): mildly elevated, not hindering life.   Safety -- tobacco screening: not using -- alcohol screening:low risk -- no evidence of domestic violence or intimate partner violence. -- STD screening: gonorrhea/chlamydia NAAT not collected per patient request.  Cancer Screening -- Prostate requested, will check with next lab draw -- Colon up to date -- Lung not indicated   Immunizations -- flu vaccine up to date -- TDAP q10 years up to date -- PPSV-23 (19-64 with chronic disease or smoking) will give today   Lynnda Child

## 2019-01-30 NOTE — Patient Instructions (Addendum)
#  Diabetes - keep doing the exercise and following the diet - return in 3 months - get an eye exam - we will do a foot exam at your next visit  #Stomach issues - Start drinking more water 64 oz a day - Try adding Metamucil daily or Colace (stool softener   We can check blood pressure at that visit too  And try the statin medicine - let me know if you get those symptoms  Do not take fenofibrate while you are taking the statin medicine

## 2019-01-30 NOTE — Assessment & Plan Note (Signed)
BP elevated today. Recheck in 3 months - has started watching diet and exercise

## 2019-02-10 ENCOUNTER — Other Ambulatory Visit: Payer: Self-pay

## 2019-02-10 MED ORDER — SEMAGLUTIDE (1 MG/DOSE) 2 MG/1.5ML ~~LOC~~ SOPN: 0.5000 mg | PEN_INJECTOR | SUBCUTANEOUS | 3 refills | Status: DC

## 2019-02-10 NOTE — Telephone Encounter (Signed)
Refill request from [facility] pharmacy received for Ozempic. We have not filled this medication for the patient yet.

## 2019-02-10 NOTE — Telephone Encounter (Signed)
It is suppose to be 0.5 mg every week per fax. I think computer changed the dose up somehow. Sorry. Please review.

## 2019-02-10 NOTE — Telephone Encounter (Signed)
I am receiving an alert that the dose may be incorrect. Please confirm prior dose with pharmacy or with patient.   Guideline dosing is 1 mg/week. His dose is currently 50 mg/week.   Lynnda Child

## 2019-02-28 ENCOUNTER — Other Ambulatory Visit: Payer: Self-pay

## 2019-02-28 MED ORDER — METFORMIN HCL 1000 MG PO TABS: 1000.0000 mg | ORAL_TABLET | Freq: Two times a day (BID) | ORAL | 1 refills | Status: DC

## 2019-02-28 MED ORDER — AMLODIPINE BESYLATE 10 MG PO TABS: 10.0000 mg | ORAL_TABLET | Freq: Every day | ORAL | 1 refills | Status: DC

## 2019-03-06 ENCOUNTER — Other Ambulatory Visit: Payer: Self-pay

## 2019-03-06 DIAGNOSIS — E785 Hyperlipidemia, unspecified: Secondary | ICD-10-CM

## 2019-03-06 MED ORDER — SIMVASTATIN 5 MG PO TABS: 5.0000 mg | ORAL_TABLET | Freq: Every day | ORAL | 3 refills | Status: DC

## 2019-04-14 ENCOUNTER — Encounter: Payer: Self-pay | Admitting: Family Medicine

## 2019-04-14 DIAGNOSIS — G4730 Sleep apnea, unspecified: Secondary | ICD-10-CM

## 2019-04-16 NOTE — Telephone Encounter (Signed)
DME order signed. Will route to team to work on coordinating getting the supplies to patient

## 2019-04-24 ENCOUNTER — Encounter: Payer: Self-pay | Admitting: Family Medicine

## 2019-04-30 ENCOUNTER — Other Ambulatory Visit: Payer: Self-pay

## 2019-04-30 DIAGNOSIS — E1169 Type 2 diabetes mellitus with other specified complication: Secondary | ICD-10-CM

## 2019-04-30 MED ORDER — SEMAGLUTIDE (1 MG/DOSE) 2 MG/1.5ML ~~LOC~~ SOPN: 0.5000 mg | PEN_INJECTOR | SUBCUTANEOUS | 3 refills | Status: DC

## 2019-04-30 NOTE — Telephone Encounter (Signed)
He is due for BP follow-up as it was elevated at last visit.   Can see if he will do virtual visit.

## 2019-04-30 NOTE — Telephone Encounter (Signed)
Refill request sent in from [facility] pharmacy for Ozempic. Last office visit note stated patient needs 3 months follow up which would be about now. Dr. Selena Batten ok to set up virtual appointment and does he need to come for any labs?

## 2019-05-01 NOTE — Telephone Encounter (Signed)
Appointment scheduled.

## 2019-05-07 DIAGNOSIS — G4733 Obstructive sleep apnea (adult) (pediatric): Secondary | ICD-10-CM | POA: Diagnosis not present

## 2019-05-08 ENCOUNTER — Ambulatory Visit: Payer: BLUE CROSS/BLUE SHIELD | Admitting: Family Medicine

## 2019-05-15 ENCOUNTER — Encounter: Payer: Self-pay | Admitting: Family Medicine

## 2019-05-15 ENCOUNTER — Ambulatory Visit (INDEPENDENT_AMBULATORY_CARE_PROVIDER_SITE_OTHER): Payer: BLUE CROSS/BLUE SHIELD | Admitting: Family Medicine

## 2019-05-15 VITALS — BP 152/91 | HR 58 | Ht 70.25 in | Wt 245.0 lb

## 2019-05-15 DIAGNOSIS — F411 Generalized anxiety disorder: Secondary | ICD-10-CM | POA: Insufficient documentation

## 2019-05-15 DIAGNOSIS — I10 Essential (primary) hypertension: Secondary | ICD-10-CM

## 2019-05-15 DIAGNOSIS — G4730 Sleep apnea, unspecified: Secondary | ICD-10-CM

## 2019-05-15 DIAGNOSIS — E1169 Type 2 diabetes mellitus with other specified complication: Secondary | ICD-10-CM | POA: Diagnosis not present

## 2019-05-15 NOTE — Assessment & Plan Note (Signed)
BP still elevated. Pt not sure if home cuff is accurate. Taking 3 medications already. Discussed return for BP comparison and to continue home monitoring. Encouraged weight loss and he will start to walk daily. If elevated will plan to start lisinopril

## 2019-05-15 NOTE — Patient Instructions (Addendum)
   Your blood pressure high.   High blood pressure increases your risk for heart attack and stroke.    Please check your blood pressure 2-4 times a week.   To check your blood pressure 1) Sit in a quiet and relaxed place for 5 minutes 2) Make sure your feet are flat on the ground 3) Consider checking first thing in the morning   Normal blood pressure is less than 140/90 Ideally you blood pressure should be around 120/80  Other ways you can reduce your blood pressure:  1) Regular exercise -- Try to get 150 minutes (30 minutes, 5 days a week) of moderate to vigorous aerobic excercise -- Examples: brisk walking (2.5 miles per hour), water aerobics, dancing, gardening, tennis, biking slower than 10 miles per hour 2) DASH Diet - low fat meats, more fresh fruits and vegetables, whole grains, low salt 3) Loose 5-10% of your body weight  [email or web address]

## 2019-05-15 NOTE — Assessment & Plan Note (Signed)
Stable on paxil.

## 2019-05-15 NOTE — Progress Notes (Signed)
I connected with Kevin Reid on 05/15/19 at  2:40 PM EDT by video and verified that I am speaking with the correct person using two identifiers.   I discussed the limitations, risks, security and privacy concerns of performing an evaluation and management service by video and the availability of in person appointments. I also discussed with the patient that there may be a patient responsible charge related to this service. The patient expressed understanding and agreed to proceed.  Patient location: Home [facility] Participants: Kevin ChildJessica R Reid and Kevin GensJeffrey Reid   Subjective:     Kevin GreeningJeffrey Reid is a 58 y.o. male presenting for Hypertension (follow up. Taking his medications daily.)     HPI   #HTN - taking amlodipine and ziac daily - has not been checking regularly at home   #DM - would like to get his HgbA1c checked - doing pretty good with the diet, but not as good as before - the pandemic has impacted his routine - weight has been stable - around 240 lbs - exercise: doing a lot of outdoor activity - knows he needs to get into more a routine - yard work  #sleep apnea - got the supplies - using his CPAP every night   Review of Systems  Eyes: Negative for visual disturbance.  Respiratory: Negative for shortness of breath.   Cardiovascular: Negative for chest pain.  Neurological: Negative for headaches.    01/30/2019: Clinic - annual, bp elevated with plan to work on diet/exercise and follow-up  Social History   Tobacco Use  Smoking Status Former Smoker  . Packs/day: 1.00  . Years: 8.00  . Pack years: 8.00  . Types: Cigarettes  . Last attempt to quit: 04/24/2018  . Years since quitting: 1.0  Smokeless Tobacco Former NeurosurgeonUser  . Types: Chew  . Quit date: 11/19/2013        Objective:   BP Readings from Last 3 Encounters:  05/15/19 (!) 152/91  01/30/19 (!) 140/92  11/19/18 128/84   Wt Readings from Last 3 Encounters:   05/15/19 245 lb (111.1 kg)  01/30/19 264 lb 4 oz (119.9 kg)  11/19/18 240 lb 4 oz (109 kg)   BP (!) 152/91 Comment: per patient on 05/14/2019  Pulse (!) 58 Comment: per patient as of 05/14/2019  Ht 5' 10.25" (1.784 m)   Wt 245 lb (111.1 kg) Comment: per patient  BMI 34.90 kg/m    Telephone visit:  Speaking in complete sentences No respiratory distress Good mood        Assessment & Plan:   Problem List Items Addressed This Visit      Cardiovascular and Mediastinum   Hypertension - Primary    BP still elevated. Pt not sure if home cuff is accurate. Taking 3 medications already. Discussed return for BP comparison and to continue home monitoring. Encouraged weight loss and he will start to walk daily. If elevated will plan to start lisinopril        Respiratory   Sleep apnea    Continues to use CPAP.         Endocrine   Diabetes mellitus, type 2 (HCC)    Discussed diet. Taking metformin w/o issues. Will check HgbA1c to see how he is doing. Last was 7.2      Relevant Orders   Hemoglobin A1c     Other   Generalized anxiety disorder    Stable on paxil        Interactive  audio and video telecommunications were attempted between this provider and patient, however failed, due to patient having technical difficulties OR patient did not have access to video capability.  We continued and completed visit with audio only.     Return if symptoms worsen or fail to improve.  Kevin Child, MD

## 2019-05-15 NOTE — Assessment & Plan Note (Signed)
Continues to use CPAP. °

## 2019-05-15 NOTE — Assessment & Plan Note (Signed)
Discussed diet. Taking metformin w/o issues. Will check HgbA1c to see how he is doing. Last was 7.2

## 2019-05-16 ENCOUNTER — Telehealth: Payer: Self-pay

## 2019-05-16 NOTE — Telephone Encounter (Signed)
Left message for patient to call back, need to set up lab appointment and time for patient to come by for b/p check and compare his b/p cuff with ours.

## 2019-05-21 NOTE — Telephone Encounter (Signed)
Left message for patient to call back  

## 2019-05-26 ENCOUNTER — Encounter: Payer: Self-pay | Admitting: *Deleted

## 2019-05-26 NOTE — Telephone Encounter (Signed)
Appointments made

## 2019-05-26 NOTE — Telephone Encounter (Signed)
I also sent MyChart message asking patient to call office to schedule lab appointment and BP check to compare his meter to ours.

## 2019-06-05 ENCOUNTER — Other Ambulatory Visit (INDEPENDENT_AMBULATORY_CARE_PROVIDER_SITE_OTHER): Payer: BC Managed Care – PPO

## 2019-06-05 ENCOUNTER — Ambulatory Visit (INDEPENDENT_AMBULATORY_CARE_PROVIDER_SITE_OTHER): Payer: BC Managed Care – PPO | Admitting: *Deleted

## 2019-06-05 VITALS — BP 132/84 | HR 57 | Temp 96.7°F

## 2019-06-05 DIAGNOSIS — E1169 Type 2 diabetes mellitus with other specified complication: Secondary | ICD-10-CM | POA: Diagnosis not present

## 2019-06-05 DIAGNOSIS — I10 Essential (primary) hypertension: Secondary | ICD-10-CM | POA: Diagnosis not present

## 2019-06-05 NOTE — Progress Notes (Signed)
Per Dr. Einar Pheasant encounter order on 06/05/2019, patient presents today for a nurse visit blood pressure check for ongoing follow up and management.  Vital Sign Readings today:BP: 132/84, Pulse: 57, O2: 95, Temp: 96.7. Patient is to keep taking his BP meds since they are working per Dr. Einar Pheasant

## 2019-06-06 LAB — HEMOGLOBIN A1C: Hgb A1c MFr Bld: 6.3 % (ref 4.6–6.5)

## 2019-07-31 ENCOUNTER — Ambulatory Visit (INDEPENDENT_AMBULATORY_CARE_PROVIDER_SITE_OTHER): Payer: 59 | Admitting: Internal Medicine

## 2019-07-31 ENCOUNTER — Encounter: Payer: Self-pay | Admitting: Internal Medicine

## 2019-07-31 ENCOUNTER — Ambulatory Visit: Payer: Self-pay | Admitting: Family Medicine

## 2019-07-31 ENCOUNTER — Other Ambulatory Visit: Payer: Self-pay

## 2019-07-31 VITALS — BP 136/96 | HR 55 | Temp 98.0°F | Wt 237.0 lb

## 2019-07-31 DIAGNOSIS — R002 Palpitations: Secondary | ICD-10-CM

## 2019-07-31 DIAGNOSIS — R42 Dizziness and giddiness: Secondary | ICD-10-CM

## 2019-07-31 NOTE — Progress Notes (Signed)
Subjective:    Patient ID: Kevin Reid, male    DOB: [birth year], 58 y.o.   MRN: [id number]  HPI  Pt presents to the clinic today with c/o palpitations. This started about 1 week ago. He reports the palpitations only lasted a few minutes. He had some associated fatigue, dizziness, but denies sweating, visual changes, nausea, chest pain, SOB or weakness. He describes the dizziness as imbalance. He has a history of anxiety, managed on Paxil. He does feel like his symptoms are well controlled. He is taking Bisoprolol for HTN. He consumes 2 liters of caffeine daily.  Review of Systems  Past Medical History:  Diagnosis Date  . Allergy   . Depression   . Diabetes mellitus without complication (HCC)   . Heart murmur   . Hyperlipidemia   . Hypertension   . Sleep apnea     Current Outpatient Medications  Medication Sig Dispense Refill  . amLODipine (NORVASC) 10 MG tablet Take 1 tablet (10 mg total) by mouth daily. 90 tablet 1  . aspirin EC 81 MG tablet Take 81 mg by mouth daily.    . bisoprolol-hydrochlorothiazide (ZIAC) 5-6.25 MG tablet TAKE TWO TABLETS BY MOUTH EVERY DAY 60 tablet 11  . fenofibrate 160 MG tablet Take 160 mg by mouth daily.  11  . levocetirizine (XYZAL) 5 MG tablet Take 5 mg by mouth every evening.    . metFORMIN (GLUCOPHAGE) 1000 MG tablet Take 1 tablet (1,000 mg total) by mouth 2 (two) times daily. 180 tablet 1  . NON FORMULARY CPAP MACHINE USING EVERY NIGHT setting at 10    . PARoxetine (PAXIL) 20 MG tablet TAKE 3 TABLETS BY MOUTH DAILY 90 tablet 11  . Semaglutide, 1 MG/DOSE, (OZEMPIC, 1 MG/DOSE,) 2 MG/1.5ML SOPN Inject 0.5 mg into the skin once a week. 1.5 mL 3  . simvastatin (ZOCOR) 5 MG tablet Take 1 tablet (5 mg total) by mouth daily. 90 tablet 3   No current facility-administered medications for this visit.     No Known Allergies  Family History  Problem Relation Age of Onset  . Prostate cancer Father [id number]  . Cancer Maternal Grandmother   . Heart attack  Maternal Grandfather   . Stroke Paternal Grandmother        old  . Stomach cancer Paternal Grandfather     Social History   Socioeconomic History  . Marital status: Married    Spouse name: Sherri  . Number of children: Not on file  . Years of education: high school  . Highest education level: Not on file  Occupational History  . Not on file  Social Needs  . Financial resource strain: Not hard at all  . Food insecurity    Worry: Not on file    Inability: Not on file  . Transportation needs    Medical: Not on file    Non-medical: Not on file  Tobacco Use  . Smoking status: Former Smoker    Packs/day: 1.00    Years: 8.00    Pack years: 8.00    Types: Cigarettes    Quit date: 04/24/2018    Years since quitting: 1.2  . Smokeless tobacco: Former NeurosurgeonUser    Types: Chew    Quit date: 11/19/2013  Substance and Sexual Activity  . Alcohol use: Yes    Comment: not regularly currently  . Drug use: No  . Sexual activity: Yes    Birth control/protection: None  Lifestyle  . Physical activity    Days  per week: Not on file    Minutes per session: Not on file  . Stress: Not on file  Relationships  . Social Herbalist on phone: Not on file    Gets together: Not on file    Attends religious service: Not on file    Active member of club or organization: Not on file    Attends meetings of clubs or organizations: Not on file    Relationship status: Not on file  . Intimate partner violence    Fear of current or ex partner: Not on file    Emotionally abused: Not on file    Physically abused: Not on file    Forced sexual activity: Not on file  Other Topics Concern  . Not on file  Social History Narrative   Married to [employer]   Enjoys - shooting for fun, does not hunt as much anymore   Exercise- nothing regularly, needs to exercise   Diet - trying to eat healthy, follow the diabetic diet (avoid carbs)   Support system: wife     Constitutional: Pt reports fatigue. Denies  fever, malaise, headache or abrupt weight changes.  Respiratory: Denies difficulty breathing, shortness of breath, cough or sputum production.   Cardiovascular: Pt reports palpitations. Denies chest pain, chest tightness, or swelling in the hands or feet.  Neurological: Pt reports dizziness. Denies difficulty with memory, difficulty with speech or problems with balance and coordination.  Psych: Pt has a history of anxiety. Denies depression, SI/HI.  No other specific complaints in a complete review of systems (except as listed in HPI above).     Objective:   Physical Exam  BP (!) 136/96   Pulse (!) 55   Temp 98 F (36.7 C) (Temporal)   Wt 237 lb (107.5 kg)   SpO2 98%   BMI 33.76 kg/m  Wt Readings from Last 3 Encounters:  07/31/19 237 lb (107.5 kg)  05/15/19 245 lb (111.1 kg)  01/30/19 264 lb 4 oz (119.9 kg)    General: Appears his stated age, obese, in NAD. Neck:  Neck supple, trachea midline. No masses, lumps or thyromegaly present.  Cardiovascular: Bradycardic with normal rhythm. S1,S2 noted.  No murmur, rubs or gallops noted. No JVD or BLE edema. Pulmonary/Chest: Normal effort and positive vesicular breath sounds. No respiratory distress. No wheezes, rales or ronchi noted.  Neurological: Alert and oriented. Coordination normal.  Psychiatric: Mood and affect normal. Behavior is normal. Judgment and thought content normal.    BMET    Component Value Date/Time   NA 136 01/15/2019 0750   NA 140 05/09/2018   K 3.8 01/15/2019 0750   CL 98 01/15/2019 0750   CO2 31 01/15/2019 0750   GLUCOSE 155 (H) 01/15/2019 0750   BUN 18 01/15/2019 0750   BUN 18 05/09/2018   CREATININE 1.04 01/15/2019 0750   CALCIUM 9.8 01/15/2019 0750   GFRNONAA >60 07/07/2018 0917   GFRAA >60 07/07/2018 0917    Lipid Panel     Component Value Date/Time   CHOL 167 01/15/2019 0750   TRIG 338.0 (H) 01/15/2019 0750   HDL 29.30 (L) 01/15/2019 0750   CHOLHDL 6 01/15/2019 0750   VLDL 67.6 (H)  01/15/2019 0750   LDLCALC 76 05/09/2018    CBC    Component Value Date/Time   WBC 11.9 (H) 07/07/2018 0917   RBC 4.92 07/07/2018 0917   HGB 15.4 07/07/2018 0917   HCT 43.0 07/07/2018 0917   PLT 172 07/07/2018 0917  MCV 87.3 07/07/2018 0917   MCH 31.3 07/07/2018 0917   MCHC 35.8 07/07/2018 0917   RDW 13.5 07/07/2018 0917    Hgb A1C Lab Results  Component Value Date   HGBA1C 6.3 06/05/2019            Assessment & Plan:   Palpitations, Dizziness:  Indication for ECG: palpitations and dizziness Interpretation of ECG: sinus brady Comparison of ECG: 06/2018, unchanged Advised him to cut way back on caffeine intake Anxiety well managed with Paxil, continue Will check BMET, Mg, TSH Referral to cardiology for possible Holtor monitor, he would also like to discuss stress testing Continue Bisoprolol for now  Will follow up after xrays, return precautions discussed Nicki Reaperegina Alizon Schmeling, NP

## 2019-07-31 NOTE — Patient Instructions (Signed)
Palpitations Palpitations are feelings that your heartbeat is not normal. Your heartbeat may feel like it is:  Uneven.  Faster than normal.  Fluttering.  Skipping a beat. This is usually not a serious problem. In some cases, you may need tests to rule out any serious problems. Follow these instructions at home: Pay attention to any changes in your condition. Take these actions to help manage your symptoms: Eating and drinking  Avoid: ? Coffee, tea, soft drinks, and energy drinks. ? Chocolate. ? Alcohol. ? Diet pills. Lifestyle   Try to lower your stress. These things can help you relax: ? Yoga. ? Deep breathing and meditation. ? Exercise. ? Using words and images to create positive thoughts (guided imagery). ? Using your mind to control things in your body (biofeedback).  Do not use drugs.  Get plenty of rest and sleep. Keep a regular bed time. General instructions   Take over-the-counter and prescription medicines only as told by your doctor.  Do not use any products that contain nicotine or tobacco, such as cigarettes and e-cigarettes. If you need help quitting, ask your doctor.  Keep all follow-up visits as told by your doctor. This is important. You may need more tests if palpitations do not go away or get worse. Contact a doctor if:  Your symptoms last more than 24 hours.  Your symptoms occur more often. Get help right away if you:  Have chest pain.  Feel short of breath.  Have a very bad headache.  Feel dizzy.  Pass out (faint). Summary  Palpitations are feelings that your heartbeat is uneven or faster than normal. It may feel like your heart is fluttering or skipping a beat.  Avoid food and drinks that may cause palpitations. These include caffeine, chocolate, and alcohol.  Try to lower your stress. Do not smoke or use drugs.  Get help right away if you faint or have chest pain, shortness of breath, a severe headache, or dizziness. This  information is not intended to replace advice given to you by your health care provider. Make sure you discuss any questions you have with your health care provider. Document Released: 09/19/2008 Document Revised: 01/23/2018 Document Reviewed: 01/23/2018 Elsevier Patient Education  2020 Elsevier Inc.  

## 2019-07-31 NOTE — Telephone Encounter (Signed)
Per wife, who is with pt, pt. Had " some dizziness earlier this week and then last night had some palpitations that lasted a few minutes." Denies shortness of breath or chest pain. No dizziness at present. Denies any new medicines. Wife unable to check pt.'s pulse. Request a visit with PCP today. Please advise. Contact number [phone].  Reason for Disposition . Age > 60 years (Exception: brief heart beat symptoms that went away and now feels well)  Answer Assessment - Initial Assessment Questions 1. DESCRIPTION: "Please describe your heart rate or heart beat that you are having" (e.g., fast/slow, regular/irregular, skipped or extra beats, "palpitations")     Palpitations 2. ONSET: "When did it start?" (Minutes, hours or days)      Last night 3. DURATION: "How long does it last" (e.g., seconds, minutes, hours)     Lasted a few minutes 4. PATTERN "Does it come and go, or has it been constant since it started?"  "Does it get worse with exertion?"   "Are you feeling it now?"     Comes and goes 5. TAP: "Using your hand, can you tap out what you are feeling on a chair or table in front of you, so that I can hear?" (Note: not all patients can do this)       No 6. HEART RATE: "Can you tell me your heart rate?" "How many beats in 15 seconds?"  (Note: not all patients can do this)       Unsure 7. RECURRENT SYMPTOM: "Have you ever had this before?" If so, ask: "When was the last time?" and "What happened that time?"      No 8. CAUSE: "What do you think is causing the palpitations?"     No 9. CARDIAC HISTORY: "Do you have any history of heart disease?" (e.g., heart attack, angina, bypass surgery, angioplasty, arrhythmia)      No 10. OTHER SYMPTOMS: "Do you have any other symptoms?" (e.g., dizziness, chest pain, sweating, difficulty breathing)       Dizziness earlier in the week 11. PREGNANCY: "Is there any chance you are pregnant?" "When was your last menstrual period?"       n/a  Protocols  used: HEART RATE AND HEARTBEAT QUESTIONS-A-AH

## 2019-07-31 NOTE — Telephone Encounter (Signed)
Appt made

## 2019-08-01 LAB — BASIC METABOLIC PANEL
BUN: 17 mg/dL (ref 6–23)
CO2: 29 mEq/L (ref 19–32)
Calcium: 9.6 mg/dL (ref 8.4–10.5)
Chloride: 98 mEq/L (ref 96–112)
Creatinine, Ser: 0.79 mg/dL (ref 0.40–1.50)
GFR: 101.07 mL/min (ref >60.00)
Glucose, Bld: 123 mg/dL — ABNORMAL HIGH (ref 70–99)
Potassium: 3.6 mEq/L (ref 3.5–5.1)
Sodium: 136 mEq/L (ref 135–145)

## 2019-08-01 LAB — TSH: TSH: 2.12 u[IU]/mL (ref 0.35–4.50)

## 2019-08-01 LAB — MAGNESIUM: Magnesium: 2 mg/dL (ref 1.5–2.5)

## 2019-08-29 ENCOUNTER — Other Ambulatory Visit: Payer: Self-pay

## 2019-08-29 MED ORDER — AMLODIPINE BESYLATE 10 MG PO TABS: 10.0000 mg | ORAL_TABLET | Freq: Every day | ORAL | 1 refills | Status: DC

## 2019-09-03 ENCOUNTER — Ambulatory Visit: Payer: 59 | Admitting: Cardiovascular Disease

## 2019-09-18 ENCOUNTER — Other Ambulatory Visit: Payer: Self-pay

## 2019-09-18 DIAGNOSIS — E1169 Type 2 diabetes mellitus with other specified complication: Secondary | ICD-10-CM

## 2019-09-18 NOTE — Telephone Encounter (Signed)
Refill request from pharmacy. Last filled on 04/30/2019 #1.5 ml, with 3 refills.  LOV 07/31/2019 with Webb Silversmith, NP for dizziness and palpitations. No future appointments with Dr Einar Pheasant.

## 2019-09-19 MED ORDER — OZEMPIC (1 MG/DOSE) 2 MG/1.5ML ~~LOC~~ SOPN: 0.5000 mg | PEN_INJECTOR | SUBCUTANEOUS | 2 refills | Status: DC

## 2019-09-19 NOTE — Telephone Encounter (Signed)
Noted.  A1C from June 2020 under excellent control. Continue semagluitde weekly, refill sent to pharmacy.

## 2019-09-26 ENCOUNTER — Ambulatory Visit: Payer: Self-pay

## 2019-09-26 DIAGNOSIS — Z23 Encounter for immunization: Secondary | ICD-10-CM

## 2019-10-14 ENCOUNTER — Other Ambulatory Visit: Payer: Self-pay

## 2019-10-14 MED ORDER — METFORMIN HCL 1000 MG PO TABS: 1000.0000 mg | ORAL_TABLET | Freq: Two times a day (BID) | ORAL | 1 refills | Status: DC

## 2019-11-12 ENCOUNTER — Other Ambulatory Visit: Payer: Self-pay | Admitting: Primary Care

## 2019-11-12 DIAGNOSIS — E1169 Type 2 diabetes mellitus with other specified complication: Secondary | ICD-10-CM

## 2019-11-13 NOTE — Telephone Encounter (Signed)
Last prescribed on 09/19/2019 by Allie Bossier . Last appointment on 07/31/2019 with [facility] for acute. No future appointment scheduled.

## 2019-11-25 ENCOUNTER — Other Ambulatory Visit: Payer: Self-pay

## 2019-11-25 MED ORDER — PAROXETINE HCL 20 MG PO TABS: 60.0000 mg | ORAL_TABLET | Freq: Every day | ORAL | 2 refills | Status: DC

## 2019-11-25 NOTE — Telephone Encounter (Signed)
Refill coming through for patient. Patient has not future appointments. When does he need to follow up?

## 2019-11-25 NOTE — Telephone Encounter (Signed)
Pt advised and appointment made

## 2019-11-25 NOTE — Telephone Encounter (Signed)
Depression medication refilled  Pt is overdue for HTN follow-up  Please have him schedule as able.

## 2019-12-01 ENCOUNTER — Ambulatory Visit: Payer: 59 | Admitting: Family Medicine

## 2019-12-09 ENCOUNTER — Ambulatory Visit (INDEPENDENT_AMBULATORY_CARE_PROVIDER_SITE_OTHER): Payer: 59 | Admitting: Family Medicine

## 2019-12-09 ENCOUNTER — Other Ambulatory Visit: Payer: Self-pay

## 2019-12-09 ENCOUNTER — Encounter: Payer: Self-pay | Admitting: Family Medicine

## 2019-12-09 VITALS — BP 132/84 | HR 53 | Temp 98.3°F | Resp 20 | Ht 70.25 in | Wt 244.5 lb

## 2019-12-09 DIAGNOSIS — M25512 Pain in left shoulder: Secondary | ICD-10-CM | POA: Diagnosis not present

## 2019-12-09 DIAGNOSIS — G8929 Other chronic pain: Secondary | ICD-10-CM

## 2019-12-09 DIAGNOSIS — I10 Essential (primary) hypertension: Secondary | ICD-10-CM

## 2019-12-09 DIAGNOSIS — R600 Localized edema: Secondary | ICD-10-CM

## 2019-12-09 DIAGNOSIS — E119 Type 2 diabetes mellitus without complications: Secondary | ICD-10-CM

## 2019-12-09 LAB — COMPREHENSIVE METABOLIC PANEL
ALT: 33 U/L (ref 0–53)
AST: 21 U/L (ref 0–37)
Albumin: 4.4 g/dL (ref 3.5–5.2)
Alkaline Phosphatase: 60 U/L (ref 39–117)
BUN: 15 mg/dL (ref 6–23)
CO2: 26 mEq/L (ref 19–32)
Calcium: 9.2 mg/dL (ref 8.4–10.5)
Chloride: 99 mEq/L (ref 96–112)
Creatinine, Ser: 0.84 mg/dL (ref 0.40–1.50)
GFR: 94.04 mL/min (ref >60.00)
Glucose, Bld: 159 mg/dL — ABNORMAL HIGH (ref 70–99)
Potassium: 3.3 mEq/L — ABNORMAL LOW (ref 3.5–5.1)
Sodium: 135 mEq/L (ref 135–145)
Total Bilirubin: 0.5 mg/dL (ref 0.2–1.2)
Total Protein: 6.9 g/dL (ref 6.0–8.3)

## 2019-12-09 LAB — HEMOGLOBIN A1C: Hgb A1c MFr Bld: 6.7 % — ABNORMAL HIGH (ref 4.6–6.5)

## 2019-12-09 LAB — BRAIN NATRIURETIC PEPTIDE: Pro B Natriuretic peptide (BNP): 45 pg/mL (ref 0.0–100.0)

## 2019-12-09 MED ORDER — LANCETS MISC: 1.0000 | Freq: Every day | 1 refills | Status: AC | PRN

## 2019-12-09 MED ORDER — LISINOPRIL 10 MG PO TABS: 10.0000 mg | ORAL_TABLET | Freq: Every day | ORAL | 3 refills | Status: DC

## 2019-12-09 NOTE — Assessment & Plan Note (Signed)
Reports not doing as well with diet. Will check given new onset swelling. Encouraged diet adherence.

## 2019-12-09 NOTE — Assessment & Plan Note (Signed)
Exam w/o clear etiology. Notes hx of overuse and long term pain. Will refer to PT for diagnosis and treatment.

## 2019-12-09 NOTE — Progress Notes (Signed)
Subjective:     Kevin Reid is a 58 y.o. male presenting for Leg Swelling (noticed some swelling and numbness sensation in the legs. Working more lately and different shifts from his normal.)     HPI   #Leg swelling - worse at night - improves after he goes to bed and elevates his legs - working nights currently - will get swollen towards the end of the shift - will get a numbness feeling - tight skin - tried to wear tight socks -- no compression stockings - swelling to the knee - Left worse than right  #Left shoulder pian - constant - chronic - numbness - pain x years   Review of Systems  Constitutional: Negative for chills and fever.  Respiratory: Negative for cough and shortness of breath.   Cardiovascular: Positive for leg swelling. Negative for chest pain and palpitations.  Neurological: Negative for headaches.     Social History   Tobacco Use  Smoking Status Former Smoker  . Packs/day: 1.00  . Years: 8.00  . Pack years: 8.00  . Types: Cigarettes  . Quit date: 04/24/2018  . Years since quitting: 1.6  Smokeless Tobacco Former Systems developer  . Types: Chew  . Quit date: 11/19/2013        Objective:    BP Readings from Last 3 Encounters:  12/09/19 132/84  07/31/19 (!) 136/96  06/05/19 132/84   Wt Readings from Last 3 Encounters:  12/09/19 244 lb 8 oz (110.9 kg)  07/31/19 237 lb (107.5 kg)  05/15/19 245 lb (111.1 kg)    BP 132/84   Pulse (!) 53   Temp 98.3 F (36.8 C)   Resp 20   Ht 5' 10.25" (1.784 m)   Wt 244 lb 8 oz (110.9 kg)   SpO2 98%   BMI 34.83 kg/m    Physical Exam Constitutional:      Appearance: Normal appearance. He is not ill-appearing or diaphoretic.  HENT:     Right Ear: External ear normal.     Left Ear: External ear normal.     Nose:     Comments: Wearing mask Eyes:     General: No scleral icterus.    Extraocular Movements: Extraocular movements intact.     Conjunctiva/sclera: Conjunctivae normal.  Cardiovascular:       Rate and Rhythm: Normal rate and regular rhythm.     Heart sounds: No murmur.  Pulmonary:     Effort: Pulmonary effort is normal. No respiratory distress.     Breath sounds: Normal breath sounds. No wheezing.  Musculoskeletal:     Cervical back: Neck supple.     Right lower leg: Edema (1+ to knee, 2+ ankle) present.     Left lower leg: Edema (1+ to knee, 2+ ankle) present.     Comments: Left Shoulder:  Inspection: no deformity Palpation: Anterior TTP, otherwise no posterior TTP ROM: normal, but with some discomfort with abduction at 160-180 degrees Strength: normal rotator cuff but with some pain Negative impingement testing  Skin:    General: Skin is warm and dry.  Neurological:     Mental Status: He is alert. Mental status is at baseline.  Psychiatric:        Mood and Affect: Mood normal.           Assessment & Plan:   Problem List Items Addressed This Visit      Cardiovascular and Mediastinum   Hypertension    BP at goal, but wonder if swelling is  side effect of amlodipine. With diabetes would benefit from ACE - switch to lisinopril. Recheck 4 weeks      Relevant Medications   lisinopril (ZESTRIL) 10 MG tablet   Other Relevant Orders   Comprehensive metabolic panel     Endocrine   Diabetes mellitus, type 2 (HCC) - Primary    Reports not doing as well with diet. Will check given new onset swelling. Encouraged diet adherence.       Relevant Medications   Lancets MISC   lisinopril (ZESTRIL) 10 MG tablet   Other Relevant Orders   Hemoglobin A1c     Other   Bilateral lower extremity edema    Etiology unclear - will stop amlodipine and screen for HF. Suspect positional due to resolution at night > compression stockings      Relevant Orders   Comprehensive metabolic panel   Brain natriuretic peptide   Chronic left shoulder pain    Exam w/o clear etiology. Notes hx of overuse and long term pain. Will refer to PT for diagnosis and treatment.        Relevant Orders   Ambulatory referral to Physical Therapy       Return in about 4 weeks (around 01/06/2020).  Lynnda Child, MD

## 2019-12-09 NOTE — Patient Instructions (Addendum)
#   Leg edema - Buy some compression socks to wear while working - try to elevate your legs as much as possible - try to walk at least 1 time every hour - elevate when home and resting  #Shoulder pain - referral to physical therapy  Blood work today - results in [place]  #Hypertension - stop amlodipine - start lisinopril - let me know if blood pressure regularly >150/95 - can increase lisinopril

## 2019-12-09 NOTE — Assessment & Plan Note (Signed)
Etiology unclear - will stop amlodipine and screen for HF. Suspect positional due to resolution at night > compression stockings

## 2019-12-09 NOTE — Assessment & Plan Note (Signed)
BP at goal, but wonder if swelling is side effect of amlodipine. With diabetes would benefit from ACE - switch to lisinopril. Recheck 4 weeks

## 2019-12-20 ENCOUNTER — Other Ambulatory Visit: Payer: Self-pay | Admitting: Family Medicine

## 2019-12-22 MED ORDER — BISOPROLOL-HYDROCHLOROTHIAZIDE 5-6.25 MG PO TABS: 2.0000 | ORAL_TABLET | Freq: Every day | ORAL | 0 refills | Status: DC

## 2019-12-22 NOTE — Addendum Note (Signed)
Addended by: Kris Mouton on: 12/22/2019 10:14 AM   Modules accepted: Orders

## 2020-01-06 DIAGNOSIS — G4731 Primary central sleep apnea: Secondary | ICD-10-CM | POA: Diagnosis not present

## 2020-01-06 DIAGNOSIS — G4733 Obstructive sleep apnea (adult) (pediatric): Secondary | ICD-10-CM | POA: Diagnosis not present

## 2020-01-07 ENCOUNTER — Ambulatory Visit: Payer: Managed Care, Other (non HMO) | Admitting: Family Medicine

## 2020-01-07 ENCOUNTER — Other Ambulatory Visit: Payer: Self-pay

## 2020-01-21 ENCOUNTER — Telehealth: Payer: Self-pay

## 2020-01-21 NOTE — Telephone Encounter (Signed)
Patient advised. Patient is working this week, symptoms are better so appointment was scheduled for 01/26/20. Patient aware to call us back sooner if any concerns come up before then

## 2020-01-21 NOTE — Telephone Encounter (Signed)
Pt left v/m; ptsaw Dr Selena Batten for CPX 01/30/19 and was given statin simvastin which did ok for awhile and now is bothering pt; starting 1 month ago pt said numbness & heaviness in chest on and off and h/a on and off;;no muscle pains. Pt had same symptoms as previous cholesterol med. And pt request to go back on previous cholesterol med which did not bother pt but was not bringing the cholesterol levels down. Pt request cb. [facility].no future appt scheduled. No CP or chest heaviness now. Pt stopped taking simvastatin 3 days and not having chest heaviness as often; no chest heaviness now.Please advise.  UC & ED precautions given and pt voiced understanding.

## 2020-01-21 NOTE — Telephone Encounter (Signed)
Would recommend office visit to discuss symptoms. Those are not typical side effects of simvastatin.   Ok that he stopped the medication.   Recommend follow-up appointment to discuss symptoms and medication alternatives in more detail. As well as recheck blood work  This should be an acute visit and not an annual.

## 2020-01-26 ENCOUNTER — Encounter: Payer: Self-pay | Admitting: Family Medicine

## 2020-01-26 ENCOUNTER — Other Ambulatory Visit: Payer: Self-pay

## 2020-01-26 ENCOUNTER — Ambulatory Visit (INDEPENDENT_AMBULATORY_CARE_PROVIDER_SITE_OTHER): Payer: Managed Care, Other (non HMO) | Admitting: Family Medicine

## 2020-01-26 VITALS — BP 152/100 | HR 61 | Temp 97.3°F | Ht 70.25 in | Wt 244.0 lb

## 2020-01-26 DIAGNOSIS — I10 Essential (primary) hypertension: Secondary | ICD-10-CM

## 2020-01-26 DIAGNOSIS — E119 Type 2 diabetes mellitus without complications: Secondary | ICD-10-CM

## 2020-01-26 DIAGNOSIS — R0789 Other chest pain: Secondary | ICD-10-CM

## 2020-01-26 DIAGNOSIS — E785 Hyperlipidemia, unspecified: Secondary | ICD-10-CM | POA: Diagnosis not present

## 2020-01-26 MED ORDER — FENOFIBRATE 145 MG PO TABS: 145.0000 mg | ORAL_TABLET | Freq: Every day | ORAL | 3 refills | Status: AC

## 2020-01-26 MED ORDER — LISINOPRIL 10 MG PO TABS: 10.0000 mg | ORAL_TABLET | Freq: Every day | ORAL | 3 refills | Status: DC

## 2020-01-26 NOTE — Progress Notes (Signed)
Subjective:     Kevin Reid is a 59 y.o. male presenting for Hyperlipidemia (stopped Simvastatin)     HPI   #HTN - still taking the ziac combination pill - not sure if he stopped the lisinopril or not - has been having headaches - is able to check bp at home - no change to diet or exercise - doing more walking  #Chest heaviness - feels like a chest cold - dull pressure like symptoms - no change with deep breath - comes and goes - symptoms will last up to 1 hour - will improve with relaxing and sitting a certain way - Triggers: breathing cold air - can be triggered by activity - typically improved with rest - symptoms occurring over the last 1-2 months - happening less often when he stopped a certain medication (thinks simvastatin but not sure)    Review of Systems  01/21/2020: Phone - numbness and chest heaviness on and off, improved with stopping simvastatin  Social History   Tobacco Use  Smoking Status Former Smoker  . Packs/day: 1.00  . Years: 8.00  . Pack years: 8.00  . Types: Cigarettes  . Quit date: 04/24/2018  . Years since quitting: 1.7  Smokeless Tobacco Former Neurosurgeon  . Types: Chew  . Quit date: 11/19/2013        Objective:    BP Readings from Last 3 Encounters:  01/26/20 (!) 152/100  12/09/19 132/84  07/31/19 (!) 136/96   Wt Readings from Last 3 Encounters:  01/26/20 244 lb (110.7 kg)  12/09/19 244 lb 8 oz (110.9 kg)  07/31/19 237 lb (107.5 kg)    BP (!) 152/100   Pulse 61   Temp (!) 97.3 F (36.3 C)   Ht 5' 10.25" (1.784 m)   Wt 244 lb (110.7 kg)   SpO2 97%   BMI 34.76 kg/m    Physical Exam Constitutional:      Appearance: Normal appearance. He is not ill-appearing or diaphoretic.  HENT:     Right Ear: External ear normal.     Left Ear: External ear normal.     Nose: Nose normal.  Eyes:     General: No scleral icterus.    Extraocular Movements: Extraocular movements intact.     Conjunctiva/sclera: Conjunctivae  normal.  Cardiovascular:     Rate and Rhythm: Normal rate and regular rhythm.     Heart sounds: No murmur.  Pulmonary:     Effort: Pulmonary effort is normal. No respiratory distress.     Breath sounds: Normal breath sounds. No wheezing.  Musculoskeletal:     Cervical back: Neck supple.  Skin:    General: Skin is warm and dry.  Neurological:     Mental Status: He is alert. Mental status is at baseline.  Psychiatric:        Mood and Affect: Mood normal.           Assessment & Plan:   Problem List Items Addressed This Visit      Cardiovascular and Mediastinum   Hypertension    BP elevated in setting of stopping lisinopril. Instructed to restart. If still elevated will plan to increase.       Relevant Medications   fenofibrate (TRICOR) 145 MG tablet   Other Relevant Orders   Ambulatory referral to Cardiology     Endocrine   Diabetes mellitus, type 2 (HCC) - Primary    Good control        Other   Hyperlipemia  Pt thought he stopped statin, but did not. Plan to continue and monitor symptoms.       Relevant Medications   fenofibrate (TRICOR) 145 MG tablet   Other Relevant Orders   Lipid panel   Ambulatory referral to Cardiology   Chest pressure    Endorses intermittent chest pressure. Advised cardiology referral. Given HTN, DM, HLD will make urgent for further work-up.           Return in about 4 weeks (around 02/23/2020).  Lesleigh Noe, MD

## 2020-01-26 NOTE — Assessment & Plan Note (Signed)
Good control

## 2020-01-26 NOTE — Assessment & Plan Note (Signed)
BP elevated in setting of stopping lisinopril. Instructed to restart. If still elevated will plan to increase.

## 2020-01-26 NOTE — Assessment & Plan Note (Signed)
Endorses intermittent chest pressure. Advised cardiology referral. Given HTN, DM, HLD will make urgent for further work-up.

## 2020-01-26 NOTE — Assessment & Plan Note (Signed)
Pt thought he stopped statin, but did not. Plan to continue and monitor symptoms.

## 2020-01-26 NOTE — Patient Instructions (Addendum)
#  Cholesterol - Ok to continue to stop Simvastatin - let me know if you want to restart Fenofibrate  #Hypertension - If you stopped lisinopril > restart - Check blood pressure 2-3 times a week and call back next week with numbers  If you did not stop lisinopril > let me know and I will send in a higher dose - Check blood pressure 2-3 times a week and call back

## 2020-01-27 ENCOUNTER — Encounter: Payer: Self-pay | Admitting: Family Medicine

## 2020-01-27 DIAGNOSIS — E785 Hyperlipidemia, unspecified: Secondary | ICD-10-CM

## 2020-01-27 LAB — LIPID PANEL
Cholesterol: 161 mg/dL (ref 0–200)
HDL: 29.3 mg/dL — ABNORMAL LOW (ref >39.00)
Total CHOL/HDL Ratio: 5
Triglycerides: 562 mg/dL — ABNORMAL HIGH (ref 0.0–149.0)

## 2020-01-27 LAB — LDL CHOLESTEROL, DIRECT: Direct LDL: 69 mg/dL

## 2020-01-28 MED ORDER — SIMVASTATIN 20 MG PO TABS: 20.0000 mg | ORAL_TABLET | Freq: Every day | ORAL | 3 refills | Status: DC

## 2020-01-29 ENCOUNTER — Ambulatory Visit: Payer: Managed Care, Other (non HMO) | Admitting: Cardiology

## 2020-02-03 DIAGNOSIS — Z20828 Contact with and (suspected) exposure to other viral communicable diseases: Secondary | ICD-10-CM | POA: Diagnosis not present

## 2020-02-04 ENCOUNTER — Telehealth: Payer: Self-pay | Admitting: *Deleted

## 2020-02-04 DIAGNOSIS — E119 Type 2 diabetes mellitus without complications: Secondary | ICD-10-CM

## 2020-02-04 NOTE — Telephone Encounter (Signed)
Patient called wanting to let Dr. Selena Batten know that he can not take the statin drug that he was recently put on. Patient stated that the statin drugs have always caused him to have chest pressure. Patient denies any chest pain. Patient stated that he would like to go back on Tricor. Patient stated he was to call in with the name of his test strips which is Contour Next test strips. Patient stated that he needs a refill on hte strips. ER precautions were given to patient and he verbalized understanding. [facility]

## 2020-02-05 MED ORDER — CONTOUR NEXT TEST VI STRP: ORAL_STRIP | 12 refills | Status: AC

## 2020-02-05 MED ORDER — CONTOUR NEXT TEST VI STRP: ORAL_STRIP | 12 refills | Status: DC

## 2020-02-05 NOTE — Telephone Encounter (Signed)
Removed Simvastatin from his list.   Previously ordered Tricor on 01/26/2020 > should be at pharmacy  Refill of test strips.   Has he been able to reschedule his Cardiology visit? Given his chest pressure I want him to be seen as soon as possible.

## 2020-02-05 NOTE — Addendum Note (Signed)
Addended by: Consuella Lose on: 02/05/2020 11:45 AM   Modules accepted: Orders

## 2020-02-05 NOTE — Telephone Encounter (Signed)
Patient advised. Also re sent test strips due to it needs to have directions on the RX.  Patient did not reschedule cardiology appointment due to feeling of chest pressure went away after stopping statin. Advised patient Dr Selena Batten wanted him to still get a check up to make sure nothing else was contributing to his symptoms. Patient verbalized understanding and will call to schedule.

## 2020-02-17 ENCOUNTER — Ambulatory Visit (INDEPENDENT_AMBULATORY_CARE_PROVIDER_SITE_OTHER): Payer: 59 | Admitting: Family Medicine

## 2020-02-17 ENCOUNTER — Encounter (INDEPENDENT_AMBULATORY_CARE_PROVIDER_SITE_OTHER): Payer: Self-pay | Admitting: Family Medicine

## 2020-02-17 ENCOUNTER — Other Ambulatory Visit: Payer: Self-pay

## 2020-02-17 VITALS — BP 173/93 | HR 47 | Temp 98.5°F | Ht 70.0 in | Wt 237.0 lb

## 2020-02-17 DIAGNOSIS — E669 Obesity, unspecified: Secondary | ICD-10-CM

## 2020-02-17 DIAGNOSIS — E876 Hypokalemia: Secondary | ICD-10-CM

## 2020-02-17 DIAGNOSIS — Z794 Long term (current) use of insulin: Secondary | ICD-10-CM | POA: Diagnosis not present

## 2020-02-17 DIAGNOSIS — Z1331 Encounter for screening for depression: Secondary | ICD-10-CM

## 2020-02-17 DIAGNOSIS — Z9189 Other specified personal risk factors, not elsewhere classified: Secondary | ICD-10-CM

## 2020-02-17 DIAGNOSIS — I10 Essential (primary) hypertension: Secondary | ICD-10-CM | POA: Diagnosis not present

## 2020-02-17 DIAGNOSIS — R5383 Other fatigue: Secondary | ICD-10-CM

## 2020-02-17 DIAGNOSIS — Z6834 Body mass index (BMI) 34.0-34.9, adult: Secondary | ICD-10-CM

## 2020-02-17 DIAGNOSIS — E7849 Other hyperlipidemia: Secondary | ICD-10-CM

## 2020-02-17 DIAGNOSIS — E119 Type 2 diabetes mellitus without complications: Secondary | ICD-10-CM | POA: Diagnosis not present

## 2020-02-17 DIAGNOSIS — R0602 Shortness of breath: Secondary | ICD-10-CM

## 2020-02-17 DIAGNOSIS — Z0289 Encounter for other administrative examinations: Secondary | ICD-10-CM

## 2020-02-17 NOTE — Progress Notes (Signed)
Chief Complaint:   OBESITY Kevin Reid (MR# [id number]) is a 59 y.o. male who presents for evaluation and treatment of obesity and related comorbidities. Current BMI is Body mass index is 34.01 kg/m. [name] has been struggling with his weight for many years and has been unsuccessful in either losing weight, maintaining weight loss, or reaching his healthy weight goal.  Kevin Reid is in the program. He lost approximately 25 lbs last year with her.  Kevin Reid is currently in the action stage of change and ready to dedicate time achieving and maintaining a healthier weight. Kevin Reid is interested in becoming our patient and working on intensive lifestyle modifications including (but not limited to) diet and exercise for weight loss.  Kevin Reid's habits were reviewed today and are as follows: His family eats meals together, he thinks his family will eat healthier with him, his desired weight loss is 37 lbs, he started gaining weight in 2002, his heaviest weight ever was 260 pounds, he has significant food cravings issues, he snacks frequently in the evenings, he wakes up frequently in the middle of the night to eat, he is frequently drinking liquids with calories, he frequently makes poor food choices, he frequently eats larger portions than normal and he struggles with emotional eating.  Depression Screen [name]'s Food and Mood (modified PHQ-9) score was 3.  Depression screen [facility] 2/9 02/17/2020  Decreased Interest 1  Down, Depressed, Hopeless 0  PHQ - 2 Score 1  Altered sleeping 0  Tired, decreased energy 2  Change in appetite 0  Feeling bad or failure about yourself  0  Trouble concentrating 0  Moving slowly or fidgety/restless 0  Suicidal thoughts 0  PHQ-9 Score 3  Difficult doing work/chores Not difficult at all   Subjective:   1. Other fatigue Kevin Reid admits to daytime somnolence and denies waking up still tired. Patent has a history of symptoms of daytime fatigue. Kevin Reid  generally gets 8 hours of sleep per night, and states that he has nightime awakenings. Snoring is not present (CPAP). Apneic episodes are not present (CPAP). Epworth Sleepiness Score is 14.  2. Shortness of breath on exertion Kevin Reid notes increasing shortness of breath with exercising and seems to be worsening over time with weight gain. He notes getting out of breath sooner with activity than he used to. This has not gotten worse recently. Kevin Reid denies shortness of breath at rest or orthopnea.  3. Type 2 diabetes mellitus without complication, with long-term current use of insulin (HCC) Kevin Reid is on Ozempic and metformin. His most recent A1c in Epic was 6.7.  4. Other hyperlipidemia Kevin Reid is on Tricor, and he would like to improve with diet.  5. Essential hypertension Kevin Reid's blood pressure is very elevated today. He is on multiple medications, and he is attempting to improve with diet. He denies chest pain.  6. Hypokalemia Kevin Reid's last K+ was low at 3.3. he has no recent lab recheck noted in Epic. He has a normal GFR, but he is hydrochlorothiazide.  7. At risk for heart disease Kevin Reid is at a higher than average risk for cardiovascular disease due to elevated blood pressure. Reviewed: no chest pain on exertion, no dyspnea on exertion, and no swelling of ankles.  Assessment/Plan:   1. Other fatigue Kevin Reid does feel that his weight is causing his energy to be lower than it should be. Fatigue may be related to obesity, depression or many other causes. Labs will be ordered, and in the meanwhile, Kevin Reid will  focus on self care including making healthy food choices, increasing physical activity and focusing on stress reduction.  - EKG 12-Lead - CBC with Differential/Platelet - Folate - T3 - T4, free - TSH - Vitamin B12 - VITAMIN D 25 Hydroxy (Vit-D Deficiency, Fractures)  2. Shortness of breath on exertion Kevin Reid does feel that he gets out of breath more easily that he  used to when he exercises. Kevin Reid's shortness of breath appears to be obesity related and exercise induced. He has agreed to work on weight loss and gradually increase exercise to treat his exercise induced shortness of breath. Will continue to monitor closely.  3. Type 2 diabetes mellitus without complication, with long-term current use of insulin (HCC) Good blood sugar control is important to decrease the likelihood of diabetic complications such as nephropathy, neuropathy, limb loss, blindness, coronary artery disease, and death. Intensive lifestyle modification including diet, exercise and weight loss are the first line of treatment for diabetes. We will check labs today. Kevin Reid will start his diet prescription and will follow up as directed.  - Hemoglobin A1c - Insulin, random  4. Other hyperlipidemia Cardiovascular risk and specific lipid/LDL goals reviewed. We discussed several lifestyle modifications today. Kevin Reid agreed to continue Tricor, he will start his diet, and will continue to work on exercise and weight loss efforts. We will check labs today. Orders and follow up as documented in patient record.   - Lipid Panel With LDL/HDL Ratio  5. Essential hypertension Kevin Reid will start his diet, and will work on healthy weight loss and exercise to improve blood pressure control. We will watch for signs of hypotension as he continues his lifestyle modifications. We will check labs today, and we will recheck his blood pressure in 2 weeks.  - Comprehensive metabolic panel  6. Hypokalemia We will check BMP today, and we will follow up.  - Microalbumin / creatinine urine ratio  7. Depression screening Kevin Reid had a negative depression screening. Depression is commonly associated with obesity and often results in emotional eating behaviors. We will monitor this closely and work on CBT to help improve the non-hunger eating patterns. Referral to Psychology may be required if no improvement  is seen as he continues in our clinic.  8. At risk for heart disease Kevin Reid was given approximately 30 minutes of coronary artery disease prevention counseling today. He is 59 y.o. male and has risk factors for heart disease including obesity and elevated blood pressure. We discussed intensive lifestyle modifications today with an emphasis on specific weight loss instructions and strategies.   Repetitive spaced learning was employed today to elicit superior memory formation and behavioral change.  9. Class 1 obesity with serious comorbidity and body mass index (BMI) of 34.0 to 34.9 in adult, unspecified obesity type Audiel is currently in the action stage of change and his goal is to continue with weight loss efforts. I recommend Avanish begin the structured treatment plan as follows:  He has agreed to the Category 4 Plan.  Exercise goals: No exercise has been prescribed for now, while we concentrate on nutritional changes.   Behavioral modification strategies: meal planning and cooking strategies.  He was informed of the importance of frequent follow-up visits to maximize his success with intensive lifestyle modifications for his multiple health conditions. He was informed we would discuss his lab results at his next visit unless there is a critical issue that needs to be addressed sooner. Anchor agreed to keep his next visit at the agreed upon time to  discuss these results.  Objective:   Blood pressure (!) 173/93, pulse (!) 47, temperature 98.5 F (36.9 C), temperature source Oral, height 5\' 10"  (1.778 m), weight 237 lb (107.5 kg), SpO2 97 %. Body mass index is 34.01 kg/m.  EKG: Sinus bradycardia, rate 52 BPM.  Indirect Calorimeter completed today shows a VO2 of 358 and a REE of 2491.  His calculated basal metabolic rate is thus his basal metabolic rate is better than expected.  General: Cooperative, alert, well developed, in no acute distress. HEENT: Conjunctivae and lids  unremarkable. Cardiovascular: Regular rhythm.  Lungs: Normal work of breathing. Neurologic: No focal deficits.   Lab Results  Component Value Date   CREATININE 0.84 12/09/2019   BUN 15 12/09/2019   NA 135 12/09/2019   K 3.3 (L) 12/09/2019   CL 99 12/09/2019   CO2 26 12/09/2019   Lab Results  Component Value Date   ALT 33 12/09/2019   AST 21 12/09/2019   ALKPHOS 60 12/09/2019   BILITOT 0.5 12/09/2019   Lab Results  Component Value Date   HGBA1C 6.7 (H) 12/09/2019   HGBA1C 6.3 06/05/2019   HGBA1C 7.2 (H) 01/15/2019   HGBA1C 6.5 05/09/2018   No results found for: INSULIN Lab Results  Component Value Date   TSH 2.12 07/31/2019   Lab Results  Component Value Date   CHOL 161 01/26/2020   HDL 29.30 (L) 01/26/2020   LDLCALC 76 05/09/2018   LDLDIRECT 69.0 01/26/2020   TRIG (H) 01/26/2020    562.0 Triglyceride is over 400; calculations on Lipids are invalid.   CHOLHDL 5 01/26/2020   Lab Results  Component Value Date   WBC 11.9 (H) 07/07/2018   HGB 15.4 07/07/2018   HCT 43.0 07/07/2018   MCV 87.3 07/07/2018   PLT 172 07/07/2018   No results found for: IRON, TIBC, FERRITIN  Attestation Statements:   Reviewed by clinician on day of visit: allergies, medications, problem list, medical history, surgical history, family history, social history, and previous encounter notes.   I, 07/09/2018, am acting as transcriptionist for Burt Knack, MD.  I have reviewed the above documentation for accuracy and completeness, and I agree with the above. - Quillian Quince, MD

## 2020-02-18 LAB — LIPID PANEL WITH LDL/HDL RATIO
Cholesterol, Total: 172 mg/dL (ref 100–199)
HDL: 29 mg/dL — ABNORMAL LOW (ref >39)
LDL Chol Calc (NIH): 83 mg/dL (ref 0–99)
LDL/HDL Ratio: 2.9 ratio (ref 0.0–3.6)
Triglycerides: 367 mg/dL — ABNORMAL HIGH (ref 0–149)
VLDL Cholesterol Cal: 60 mg/dL — ABNORMAL HIGH (ref 5–40)

## 2020-02-18 LAB — CBC WITH DIFFERENTIAL/PLATELET
Basophils Absolute: 0.1 10*3/uL (ref 0.0–0.2)
Basos: 1 %
EOS (ABSOLUTE): 0.1 10*3/uL (ref 0.0–0.4)
Eos: 2 %
Hematocrit: 45.5 % (ref 37.5–51.0)
Hemoglobin: 16.3 g/dL (ref 13.0–17.7)
Immature Grans (Abs): 0 10*3/uL (ref 0.0–0.1)
Immature Granulocytes: 1 %
Lymphocytes Absolute: 1.2 10*3/uL (ref 0.7–3.1)
Lymphs: 20 %
MCH: 31 pg (ref 26.6–33.0)
MCHC: 35.8 g/dL — ABNORMAL HIGH (ref 31.5–35.7)
MCV: 87 fL (ref 79–97)
Monocytes Absolute: 0.5 10*3/uL (ref 0.1–0.9)
Monocytes: 8 %
Neutrophils Absolute: 4.1 10*3/uL (ref 1.4–7.0)
Neutrophils: 68 %
Platelets: 181 10*3/uL (ref 150–450)
RBC: 5.25 x10E6/uL (ref 4.14–5.80)
RDW: 12.9 % (ref 11.6–15.4)
WBC: 5.9 10*3/uL (ref 3.4–10.8)

## 2020-02-18 LAB — VITAMIN D 25 HYDROXY (VIT D DEFICIENCY, FRACTURES): Vit D, 25-Hydroxy: 18.9 ng/mL — ABNORMAL LOW (ref 30.0–100.0)

## 2020-02-18 LAB — COMPREHENSIVE METABOLIC PANEL
ALT: 30 IU/L (ref 0–44)
AST: 22 IU/L (ref 0–40)
Albumin/Globulin Ratio: 2 (ref 1.2–2.2)
Albumin: 4.3 g/dL (ref 3.8–4.9)
Alkaline Phosphatase: 50 IU/L (ref 39–117)
BUN/Creatinine Ratio: 15 (ref 9–20)
BUN: 15 mg/dL (ref 6–24)
Bilirubin Total: 0.4 mg/dL (ref 0.0–1.2)
CO2: 24 mmol/L (ref 20–29)
Calcium: 9.3 mg/dL (ref 8.7–10.2)
Chloride: 100 mmol/L (ref 96–106)
Creatinine, Ser: 1.03 mg/dL (ref 0.76–1.27)
GFR calc Af Amer: 93 mL/min/{1.73_m2} (ref >59)
GFR calc non Af Amer: 80 mL/min/{1.73_m2} (ref >59)
Globulin, Total: 2.2 g/dL (ref 1.5–4.5)
Glucose: 131 mg/dL — ABNORMAL HIGH (ref 65–99)
Potassium: 3.8 mmol/L (ref 3.5–5.2)
Sodium: 140 mmol/L (ref 134–144)
Total Protein: 6.5 g/dL (ref 6.0–8.5)

## 2020-02-18 LAB — T3: T3, Total: 106 ng/dL (ref 71–180)

## 2020-02-18 LAB — HEMOGLOBIN A1C
Est. average glucose Bld gHb Est-mCnc: 154 mg/dL
Hgb A1c MFr Bld: 7 % — ABNORMAL HIGH (ref 4.8–5.6)

## 2020-02-18 LAB — TSH: TSH: 1.12 u[IU]/mL (ref 0.450–4.500)

## 2020-02-18 LAB — MICROALBUMIN / CREATININE URINE RATIO
Creatinine, Urine: 168.4 mg/dL
Microalb/Creat Ratio: 58 mg/g creat — ABNORMAL HIGH (ref 0–29)
Microalbumin, Urine: 98.4 ug/mL

## 2020-02-18 LAB — T4, FREE: Free T4: 1.04 ng/dL (ref 0.82–1.77)

## 2020-02-18 LAB — VITAMIN B12: Vitamin B-12: 210 pg/mL — ABNORMAL LOW (ref 232–1245)

## 2020-02-18 LAB — INSULIN, RANDOM: INSULIN: 13.1 u[IU]/mL (ref 2.6–24.9)

## 2020-02-18 LAB — FOLATE: Folate: 16.1 ng/mL (ref >3.0)

## 2020-02-27 ENCOUNTER — Other Ambulatory Visit: Payer: Self-pay

## 2020-02-27 MED ORDER — BISOPROLOL-HYDROCHLOROTHIAZIDE 5-6.25 MG PO TABS: 2.0000 | ORAL_TABLET | Freq: Every day | ORAL | 2 refills | Status: DC

## 2020-02-27 NOTE — Telephone Encounter (Signed)
Spoke with patient today to follow up on his b/p readings. Patient has not been checking his b/p but had it checked at     Dr. Francena Hanly office 2 weeks ago and it was elevated (notes in epic). He has follow up with Dr Dalbert Garnet on 03/02/20 and will see how his b/p then, if it is still elevated he will call us back to follow up on this.

## 2020-02-27 NOTE — Telephone Encounter (Signed)
BP was 173/93 at Dr. Francena Hanly office. This is too high. Kevin Reid does have a scheduled office visit with Dr. Dalbert Garnet on 03/02/20, plan is to recheck BP at that time.

## 2020-03-02 ENCOUNTER — Ambulatory Visit (INDEPENDENT_AMBULATORY_CARE_PROVIDER_SITE_OTHER): Payer: 59 | Admitting: Family Medicine

## 2020-03-02 ENCOUNTER — Encounter (INDEPENDENT_AMBULATORY_CARE_PROVIDER_SITE_OTHER): Payer: Self-pay | Admitting: Family Medicine

## 2020-03-02 ENCOUNTER — Other Ambulatory Visit: Payer: Self-pay

## 2020-03-02 VITALS — BP 173/91 | HR 52 | Temp 98.5°F | Ht 70.0 in | Wt 229.0 lb

## 2020-03-02 DIAGNOSIS — I10 Essential (primary) hypertension: Secondary | ICD-10-CM

## 2020-03-02 DIAGNOSIS — Z6832 Body mass index (BMI) 32.0-32.9, adult: Secondary | ICD-10-CM

## 2020-03-02 DIAGNOSIS — E119 Type 2 diabetes mellitus without complications: Secondary | ICD-10-CM | POA: Diagnosis not present

## 2020-03-02 DIAGNOSIS — Z9189 Other specified personal risk factors, not elsewhere classified: Secondary | ICD-10-CM | POA: Diagnosis not present

## 2020-03-02 DIAGNOSIS — E559 Vitamin D deficiency, unspecified: Secondary | ICD-10-CM | POA: Diagnosis not present

## 2020-03-02 DIAGNOSIS — E538 Deficiency of other specified B group vitamins: Secondary | ICD-10-CM | POA: Diagnosis not present

## 2020-03-02 DIAGNOSIS — Z794 Long term (current) use of insulin: Secondary | ICD-10-CM

## 2020-03-02 DIAGNOSIS — E7849 Other hyperlipidemia: Secondary | ICD-10-CM | POA: Diagnosis not present

## 2020-03-02 DIAGNOSIS — E669 Obesity, unspecified: Secondary | ICD-10-CM

## 2020-03-02 MED ORDER — VITAMIN D (ERGOCALCIFEROL) 1.25 MG (50000 UNIT) PO CAPS: 50000.0000 [IU] | ORAL_CAPSULE | ORAL | 0 refills | Status: DC

## 2020-03-02 MED ORDER — LISINOPRIL 10 MG PO TABS: 20.0000 mg | ORAL_TABLET | Freq: Every day | ORAL | 0 refills | Status: DC

## 2020-03-02 NOTE — Telephone Encounter (Signed)
Agree, if bp remains elevated will recommend follow-up visit to discuss treatment.

## 2020-03-03 NOTE — Progress Notes (Signed)
Chief Complaint:   OBESITY Kevin Reid is here to discuss his progress with his obesity treatment plan along with follow-up of his obesity related diagnoses. Kevin Reid is on the Category 4 Plan and states he is following his eating plan approximately 100% of the time. Kevin Reid states he is doing 0 minutes 0 times per week.  Today's visit was #: 2 Starting weight: 237 lbs Starting date: 02/17/2020 Today's weight: 229 lbs Today's date: 03/02/2020 Total lbs lost to date: 8 Total lbs lost since last in-office visit: 8  Interim History: Kevin Reid did very well with Category and weight loss. His hunger was mostly controlled and he was overall happy with his food choices, but he would like more lunch options.  Subjective:   1. Other hyperlipidemia Kevin Reid's triglycerides are elevated but improved from his last visit. He is on Tricor and he is working on his diet. I discussed labs with the patient today.  2. Vitamin D deficiency Kevin Reid's Vit D is low and he notes fatigue. He is not on Vit D. I discussed labs with the patient today.  3. B12 deficiency Kevin Reid's B12 is low, he is not a vegetarian and he denies intestinal surgery. He denies a history of anemia, and he is on metformin. I discussed labs with the patient today.  4. Type 2 diabetes mellitus without complication, with long-term current use of insulin (HCC) Kevin Reid is not checking his blood sugars at home, and has had 1-2 episodes of feeling hypoglycemic. His recent A1c was 7.0. I discussed labs with the patient today.  5. Essential hypertension Kevin Reid blood pressure is elevated 2 times in a row. He denies chest pain, and he is on BB diuretic and ACE. I discussed labs with the patient today.  6. At risk for hypoglycemia Kevin Reid is at increased risk for hypoglycemia due to changes in diet and weight loss. Kevin Reid is currently taking insulin.   Assessment/Plan:   1. Other hyperlipidemia Cardiovascular risk and specific lipid/LDL  goals reviewed. We discussed several lifestyle modifications today. [name] will continue to work on diet, exercise and weight loss efforts, may need to consider statin in the near future to help with cardio protection. Orders and follow up as documented in patient record.   2. Vitamin D deficiency Low Vitamin D level contributes to fatigue and are associated with obesity, breast, and colon cancer. Kevin Reid agreed to start prescription Vitamin D 50,000 IU every week with no refills. He will follow-up for routine testing of Vitamin D, at least 2-3 times per year to avoid over-replacement.  - Vitamin D, Ergocalciferol, (DRISDOL) 1.25 MG (50000 UNIT) CAPS capsule; Take 1 capsule (50,000 Units total) by mouth every 7 (seven) days.  Dispense: 4 capsule; Refill: 0  3. B12 deficiency The diagnosis was reviewed with the patient. He is to continue with a B12 rich diet, may need supplementation if no improvement. We will continue to monitor. We will recheck labs in 3 months. Orders and follow up as documented in patient record.  4. Type 2 diabetes mellitus without complication, with long-term current use of insulin (HCC) Good blood sugar control is important to decrease the likelihood of diabetic complications such as nephropathy, neuropathy, limb loss, blindness, coronary artery disease, and death. Intensive lifestyle modification including diet, exercise and weight loss are the first line of treatment for diabetes. Hypoglycemia handout was given to the patient today, and he is to carry glucose tablets.  5. Essential hypertension Kevin Reid is working on healthy weight loss and exercise  to improve blood pressure control. We will watch for signs of hypotension as he continues his lifestyle modifications. Kevin Reid agreed to increase lisinopril to 20 mg daily with no refills. We will recheck his blood pressure in 2 weeks.  - lisinopril (ZESTRIL) 10 MG tablet; Take 2 tablets (20 mg total) by mouth daily.  Dispense: 30  tablet; Refill: 0  6. At risk for hypoglycemia Kevin Reid was given approximately 30 minutes of counseling today regarding prevention of hypoglycemia. He was advised of symptoms of hypoglycemia. Kevin Reid was instructed to avoid skipping meals, eat regular protein rich meals and schedule low calorie snacks as needed.   Repetitive spaced learning was employed today to elicit superior memory formation and behavioral change  7. Class 1 obesity with serious comorbidity and body mass index (BMI) of 32.0 to 32.9 in adult, unspecified obesity type Kevin Reid is currently in the action stage of change. As such, his goal is to continue with weight loss efforts. He has agreed to the Category 4 Plan with lunch options.   Behavioral modification strategies: increasing lean protein intake.  Kevin Reid has agreed to follow-up with our clinic in 2 weeks. He was informed of the importance of frequent follow-up visits to maximize his success with intensive lifestyle modifications for his multiple health conditions.   Objective:   Blood pressure (!) 173/91, pulse (!) 52, temperature 98.5 F (36.9 C), temperature source Oral, height 5\' 10"  (1.778 m), weight 229 lb (103.9 kg), SpO2 97 %. Body mass index is 32.86 kg/m.  General: Cooperative, alert, well developed, in no acute distress. HEENT: Conjunctivae and lids unremarkable. Cardiovascular: Regular rhythm.  Lungs: Normal work of breathing. Neurologic: No focal deficits.   Lab Results  Component Value Date   CREATININE 1.03 02/17/2020   BUN 15 02/17/2020   NA 140 02/17/2020   K 3.8 02/17/2020   CL 100 02/17/2020   CO2 24 02/17/2020   Lab Results  Component Value Date   ALT 30 02/17/2020   AST 22 02/17/2020   ALKPHOS 50 02/17/2020   BILITOT 0.4 02/17/2020   Lab Results  Component Value Date   HGBA1C 7.0 (H) 02/17/2020   HGBA1C 6.7 (H) 12/09/2019   HGBA1C 6.3 06/05/2019   HGBA1C 7.2 (H) 01/15/2019   HGBA1C 6.5 05/09/2018   Lab Results  Component  Value Date   INSULIN 13.1 02/17/2020   Lab Results  Component Value Date   TSH 1.120 02/17/2020   Lab Results  Component Value Date   CHOL 172 02/17/2020   HDL 29 (L) 02/17/2020   LDLCALC 83 02/17/2020   LDLDIRECT 69.0 01/26/2020   TRIG 367 (H) 02/17/2020   CHOLHDL 5 01/26/2020   Lab Results  Component Value Date   WBC 5.9 02/17/2020   HGB 16.3 02/17/2020   HCT 45.5 02/17/2020   MCV 87 02/17/2020   PLT 181 02/17/2020   No results found for: IRON, TIBC, FERRITIN  Attestation Statements:   Reviewed by clinician on day of visit: allergies, medications, problem list, medical history, surgical history, family history, social history, and previous encounter notes.   I, 02/19/2020, am acting as transcriptionist for Burt Knack, MD.  I have reviewed the above documentation for accuracy and completeness, and I agree with the above. -  Quillian Quince, MD

## 2020-03-15 ENCOUNTER — Other Ambulatory Visit: Payer: Self-pay

## 2020-03-15 ENCOUNTER — Telehealth: Payer: Self-pay

## 2020-03-15 ENCOUNTER — Ambulatory Visit (INDEPENDENT_AMBULATORY_CARE_PROVIDER_SITE_OTHER): Payer: 59 | Admitting: Family Medicine

## 2020-03-15 ENCOUNTER — Encounter (INDEPENDENT_AMBULATORY_CARE_PROVIDER_SITE_OTHER): Payer: Self-pay | Admitting: Family Medicine

## 2020-03-15 VITALS — BP 168/87 | HR 56 | Temp 98.2°F | Ht 70.0 in | Wt 231.0 lb

## 2020-03-15 DIAGNOSIS — Z9189 Other specified personal risk factors, not elsewhere classified: Secondary | ICD-10-CM

## 2020-03-15 DIAGNOSIS — Z6833 Body mass index (BMI) 33.0-33.9, adult: Secondary | ICD-10-CM

## 2020-03-15 DIAGNOSIS — I10 Essential (primary) hypertension: Secondary | ICD-10-CM

## 2020-03-15 DIAGNOSIS — E669 Obesity, unspecified: Secondary | ICD-10-CM

## 2020-03-15 DIAGNOSIS — E1165 Type 2 diabetes mellitus with hyperglycemia: Secondary | ICD-10-CM

## 2020-03-15 DIAGNOSIS — F3289 Other specified depressive episodes: Secondary | ICD-10-CM

## 2020-03-15 MED ORDER — LISINOPRIL 30 MG PO TABS: 30.0000 mg | ORAL_TABLET | Freq: Every day | ORAL | 0 refills | Status: DC

## 2020-03-15 NOTE — Progress Notes (Signed)
Chief Complaint:   OBESITY Kevin Reid is here to discuss his progress with his obesity treatment plan along with follow-up of his obesity related diagnoses. Kevin Reid is on the Category 4 Plan with lunch options and states he is following his eating plan approximately 60% of the time. Kevin Reid states he is walking 2 miles 5 times per week.  Today's visit was #: 3 Starting weight: 237 lbs Starting date: 02/17/2020 Today's weight: 231 lbs Today's date: 03/15/2020 Total lbs lost to date: 6 Total lbs lost since last in-office visit: 0  Interim History: Kevin Reid's wife is out of town and he found it difficult to stay on the plan and prep his food. He did have a party 2 days ago and indulged in drinking beer. He would like to stick to the Category 4 plan the next two weeks. He denies obstacles in the next 2 weeks.  Subjective:   1. Essential hypertension Kevin Reid's blood pressure is elevated again today. He started on lisinopril 10 mg but his blood pressure is still elevated. He denies chest pain, chest pressure, or headaches.  2. Type 2 diabetes mellitus with hyperglycemia, without long-term current use of insulin (HCC) Infant denies feelings of hypoglycemia. He is on Ozempic and metformin, and he denies side effects of either medications.  3. Other depression, with emotional eating Kevin Reid is on Paxil 60 mg daily. He denies suicidal ideas or homicidal ideas. He voices he has previously been on Prozac and Lexapro, but he couldn't tolerate them.  4. At risk for heart disease Kevin Reid is at a higher than average risk for cardiovascular disease due to obesity. Reviewed: no chest pain on exertion, no dyspnea on exertion, and no swelling of ankles.  Assessment/Plan:   1. Essential hypertension Tallen is working on healthy weight loss and exercise to improve blood pressure control. We will watch for signs of hypotension as he continues his lifestyle modifications. Romin agreed to increase  lisinopril to 30 mg daily with no refills (increased to 20 mg at his last appointment).  - lisinopril (ZESTRIL) 30 MG tablet; Take 1 tablet (30 mg total) by mouth daily.  Dispense: 30 tablet; Refill: 0  2. Type 2 diabetes mellitus with hyperglycemia, without long-term current use of insulin (HCC) Good blood sugar control is important to decrease the likelihood of diabetic complications such as nephropathy, neuropathy, limb loss, blindness, coronary artery disease, and death. Intensive lifestyle modification including diet, exercise and weight loss are the first line of treatment for diabetes. Kevin Reid is to check his blood sugars 3 times per week, fasting.  3. Other depression, with emotional eating Behavior modification techniques were discussed today to help Kevin Reid deal with his emotional/non-hunger eating behaviors. We will follow up at his next appointment. Orders and follow up as documented in patient record.   4. At risk for heart disease Kevin Reid was given approximately 15 minutes of coronary artery disease prevention counseling today. He is 59 y.o. male and has risk factors for heart disease including obesity. We discussed intensive lifestyle modifications today with an emphasis on specific weight loss instructions and strategies.   Repetitive spaced learning was employed today to elicit superior memory formation and behavioral change.  5. Class 1 obesity with serious comorbidity and body mass index (BMI) of 33.0 to 33.9 in adult, unspecified obesity type Kevin Reid is currently in the action stage of change. As such, his goal is to continue with weight loss efforts. He has agreed to the Category 4 Plan.   Exercise  goals: No exercise has been prescribed at this time.  Behavioral modification strategies: increasing lean protein intake, increasing vegetables, meal planning and cooking strategies and keeping healthy foods in the home.  Kevin Reid has agreed to follow-up with our clinic in 2  weeks. He was informed of the importance of frequent follow-up visits to maximize his success with intensive lifestyle modifications for his multiple health conditions.   Objective:   Blood pressure (!) 168/87, pulse (!) 56, temperature 98.2 F (36.8 C), temperature source Oral, height 5\' 10"  (1.778 m), weight 231 lb (104.8 kg), SpO2 97 %. Body mass index is 33.15 kg/m.  General: Cooperative, alert, well developed, in no acute distress. HEENT: Conjunctivae and lids unremarkable. Cardiovascular: Regular rhythm.  Lungs: Normal work of breathing. Neurologic: No focal deficits.   Lab Results  Component Value Date   CREATININE 1.03 02/17/2020   BUN 15 02/17/2020   NA 140 02/17/2020   K 3.8 02/17/2020   CL 100 02/17/2020   CO2 24 02/17/2020   Lab Results  Component Value Date   ALT 30 02/17/2020   AST 22 02/17/2020   ALKPHOS 50 02/17/2020   BILITOT 0.4 02/17/2020   Lab Results  Component Value Date   HGBA1C 7.0 (H) 02/17/2020   HGBA1C 6.7 (H) 12/09/2019   HGBA1C 6.3 06/05/2019   HGBA1C 7.2 (H) 01/15/2019   HGBA1C 6.5 05/09/2018   Lab Results  Component Value Date   INSULIN 13.1 02/17/2020   Lab Results  Component Value Date   TSH 1.120 02/17/2020   Lab Results  Component Value Date   CHOL 172 02/17/2020   HDL 29 (L) 02/17/2020   LDLCALC 83 02/17/2020   LDLDIRECT 69.0 01/26/2020   TRIG 367 (H) 02/17/2020   CHOLHDL 5 01/26/2020   Lab Results  Component Value Date   WBC 5.9 02/17/2020   HGB 16.3 02/17/2020   HCT 45.5 02/17/2020   MCV 87 02/17/2020   PLT 181 02/17/2020   No results found for: IRON, TIBC, FERRITIN  Attestation Statements:   Reviewed by clinician on day of visit: allergies, medications, problem list, medical history, surgical history, family history, social history, and previous encounter notes.   I, Trixie Dredge, am acting as transcriptionist for Ilene Qua, MD.  I have reviewed the above documentation for accuracy and  completeness, and I agree with the above. - Ilene Qua, MD

## 2020-03-15 NOTE — Telephone Encounter (Signed)
12/09/19 Physical Therapy Referral closed.  See Referral Notes/hx.  Pt has not responded to phone calls or Unable to Contact letter.  

## 2020-03-29 ENCOUNTER — Ambulatory Visit (INDEPENDENT_AMBULATORY_CARE_PROVIDER_SITE_OTHER): Payer: 59 | Admitting: Physician Assistant

## 2020-04-05 DIAGNOSIS — M25511 Pain in right shoulder: Secondary | ICD-10-CM | POA: Diagnosis not present

## 2020-04-05 DIAGNOSIS — M75101 Unspecified rotator cuff tear or rupture of right shoulder, not specified as traumatic: Secondary | ICD-10-CM | POA: Diagnosis not present

## 2020-04-06 ENCOUNTER — Encounter (INDEPENDENT_AMBULATORY_CARE_PROVIDER_SITE_OTHER): Payer: Self-pay | Admitting: Family Medicine

## 2020-04-06 ENCOUNTER — Other Ambulatory Visit: Payer: Self-pay

## 2020-04-06 ENCOUNTER — Ambulatory Visit (INDEPENDENT_AMBULATORY_CARE_PROVIDER_SITE_OTHER): Payer: 59 | Admitting: Family Medicine

## 2020-04-06 VITALS — BP 168/88 | HR 51 | Temp 97.9°F | Ht 70.0 in | Wt 229.0 lb

## 2020-04-06 DIAGNOSIS — Z9189 Other specified personal risk factors, not elsewhere classified: Secondary | ICD-10-CM | POA: Diagnosis not present

## 2020-04-06 DIAGNOSIS — E559 Vitamin D deficiency, unspecified: Secondary | ICD-10-CM | POA: Diagnosis not present

## 2020-04-06 DIAGNOSIS — E1165 Type 2 diabetes mellitus with hyperglycemia: Secondary | ICD-10-CM | POA: Diagnosis not present

## 2020-04-06 DIAGNOSIS — E669 Obesity, unspecified: Secondary | ICD-10-CM

## 2020-04-06 DIAGNOSIS — Z6833 Body mass index (BMI) 33.0-33.9, adult: Secondary | ICD-10-CM

## 2020-04-06 DIAGNOSIS — I10 Essential (primary) hypertension: Secondary | ICD-10-CM | POA: Diagnosis not present

## 2020-04-06 MED ORDER — VITAMIN D (ERGOCALCIFEROL) 1.25 MG (50000 UNIT) PO CAPS: 50000.0000 [IU] | ORAL_CAPSULE | ORAL | 0 refills | Status: DC

## 2020-04-06 MED ORDER — BLOOD GLUCOSE MONITOR SYSTEM W/DEVICE KIT: PACK | 0 refills | Status: AC

## 2020-04-07 NOTE — Progress Notes (Signed)
Chief Complaint:   OBESITY Kevin Reid is here to discuss his progress with his obesity treatment plan along with follow-up of his obesity related diagnoses. Kevin Reid is on the Category 4 Plan and states he is following his eating plan approximately 60% of the time. Kevin Reid states he is walking 10,000 steps 7 times per week.  Today's visit was #: 4 Starting weight: 237 lbs Starting date: 02/17/2020 Today's weight: 229 lbs Today's date: 04/06/2020 Total lbs lost to date: 8 Total lbs lost since last in-office visit: 2  Interim History: Kevin Reid notes the last few weeks he has been focused on the meal plan consistently the first week, but has struggled to adhere to the plan certain days. He does voices he feels he makes healthier choices. He eats out but tried to get a good amount of protein in when ordering. He is not always getting breakfast in.  Subjective:   1. Vitamin D deficiency Fairley denies nausea, vomiting, or muscle weakness, but he notes fatigue. He is on prescription Vit D. Last Vit D level was 18.9.  2. Essential hypertension Kevin Reid's blood pressure is elevated today. He denies chest pain, chest pressure, or headaches.  3. Type 2 diabetes mellitus with hyperglycemia, without long-term current use of insulin ([place]) Kevin Reid voices he cannot find a glucometer. He denies feelings of hypoglycemia. He is on Ozempic and metformin.  4. At risk for osteoporosis Kevin Reid is at higher risk of osteopenia and osteoporosis due to Vitamin D deficiency.   Assessment/Plan:   1. Vitamin D deficiency Low Vitamin D level contributes to fatigue and are associated with obesity, breast, and colon cancer. We will refill prescription Vitamin D for 1 month. Afnan will follow-up for routine testing of Vitamin D, at least 2-3 times per year to avoid over-replacement.  - Vitamin D, Ergocalciferol, (DRISDOL) 1.25 MG (50000 UNIT) CAPS capsule; Take 1 capsule (50,000 Units total) by mouth every 7  (seven) days.  Dispense: 4 capsule; Refill: 0  2. Essential hypertension Hansel is working on healthy weight loss and exercise to improve blood pressure control. We will watch for signs of hypotension as he continues his lifestyle modifications. We will follow up on his blood pressure at his next appointment, if still elevated then will consider increase in medications.  3. Type 2 diabetes mellitus with hyperglycemia, without long-term current use of insulin (HCC) Good blood sugar control is important to decrease the likelihood of diabetic complications such as nephropathy, neuropathy, limb loss, blindness, coronary artery disease, and death. Intensive lifestyle modification including diet, exercise and weight loss are the first line of treatment for diabetes. We will send glucometer kit script to the pharmacy.  - Blood Glucose Monitoring Suppl (BLOOD GLUCOSE MONITOR SYSTEM) w/Device KIT; Use to test blood sugar once daily  Dispense: 1 kit; Refill: 0  4. At risk for osteoporosis Kevin Reid was given approximately 15 minutes of osteoporosis prevention counseling today. Kevin Reid is at risk for osteopenia and osteoporosis due to his Vitamin D deficiency. He was encouraged to take his Vitamin D and follow his higher calcium diet and increase strengthening exercise to help strengthen his bones and decrease his risk of osteopenia and osteoporosis.  Repetitive spaced learning was employed today to elicit superior memory formation and behavioral change.  5. Class 1 obesity with serious comorbidity and body mass index (BMI) of 33.0 to 33.9 in adult, unspecified obesity type Kevin Reid is currently in the action stage of change. As such, his goal is to continue with weight  loss efforts. He has agreed to the Category 4 Plan.   Exercise goals: As is.  Behavioral modification strategies: increasing lean protein intake, increasing vegetables, meal planning and cooking strategies, keeping healthy foods in the home  and planning for success.  Kevin Reid has agreed to follow-up with our clinic in 2 weeks. He was informed of the importance of frequent follow-up visits to maximize his success with intensive lifestyle modifications for his multiple health conditions.   Objective:   Blood pressure (!) 168/88, pulse (!) 51, temperature 97.9 F (36.6 C), temperature source Oral, height '5\' 10"'  (1.778 m), weight 229 lb (103.9 kg), SpO2 98 %. Body mass index is 32.86 kg/m.  General: Cooperative, alert, well developed, in no acute distress. HEENT: Conjunctivae and lids unremarkable. Cardiovascular: Regular rhythm.  Lungs: Normal work of breathing. Neurologic: No focal deficits.   Lab Results  Component Value Date   CREATININE 1.03 02/17/2020   BUN 15 02/17/2020   NA 140 02/17/2020   K 3.8 02/17/2020   CL 100 02/17/2020   CO2 24 02/17/2020   Lab Results  Component Value Date   ALT 30 02/17/2020   AST 22 02/17/2020   ALKPHOS 50 02/17/2020   BILITOT 0.4 02/17/2020   Lab Results  Component Value Date   HGBA1C 7.0 (H) 02/17/2020   HGBA1C 6.7 (H) 12/09/2019   HGBA1C 6.3 06/05/2019   HGBA1C 7.2 (H) 01/15/2019   HGBA1C 6.5 05/09/2018   Lab Results  Component Value Date   INSULIN 13.1 02/17/2020   Lab Results  Component Value Date   TSH 1.120 02/17/2020   Lab Results  Component Value Date   CHOL 172 02/17/2020   HDL 29 (L) 02/17/2020   LDLCALC 83 02/17/2020   LDLDIRECT 69.0 01/26/2020   TRIG 367 (H) 02/17/2020   CHOLHDL 5 01/26/2020   Lab Results  Component Value Date   WBC 5.9 02/17/2020   HGB 16.3 02/17/2020   HCT 45.5 02/17/2020   MCV 87 02/17/2020   PLT 181 02/17/2020   No results found for: IRON, TIBC, FERRITIN  Attestation Statements:   Reviewed by clinician on day of visit: allergies, medications, problem list, medical history, surgical history, family history, social history, and previous encounter notes.   I, Trixie Dredge, am acting as transcriptionist for Kevin Manson, MD.  I have reviewed the above documentation for accuracy and completeness, and I agree with the above. - Ilene Qua, MD

## 2020-04-12 DIAGNOSIS — M25511 Pain in right shoulder: Secondary | ICD-10-CM | POA: Diagnosis not present

## 2020-04-12 DIAGNOSIS — M75101 Unspecified rotator cuff tear or rupture of right shoulder, not specified as traumatic: Secondary | ICD-10-CM | POA: Diagnosis not present

## 2020-04-15 ENCOUNTER — Encounter (INDEPENDENT_AMBULATORY_CARE_PROVIDER_SITE_OTHER): Payer: Self-pay | Admitting: Family Medicine

## 2020-04-16 ENCOUNTER — Other Ambulatory Visit (INDEPENDENT_AMBULATORY_CARE_PROVIDER_SITE_OTHER): Payer: Self-pay | Admitting: Family Medicine

## 2020-04-16 DIAGNOSIS — I10 Essential (primary) hypertension: Secondary | ICD-10-CM

## 2020-04-19 DIAGNOSIS — M25511 Pain in right shoulder: Secondary | ICD-10-CM | POA: Diagnosis not present

## 2020-04-19 DIAGNOSIS — M75101 Unspecified rotator cuff tear or rupture of right shoulder, not specified as traumatic: Secondary | ICD-10-CM | POA: Diagnosis not present

## 2020-04-21 ENCOUNTER — Other Ambulatory Visit: Payer: Self-pay

## 2020-04-21 ENCOUNTER — Encounter: Payer: Self-pay | Admitting: Family Medicine

## 2020-04-21 ENCOUNTER — Ambulatory Visit (INDEPENDENT_AMBULATORY_CARE_PROVIDER_SITE_OTHER): Payer: 59 | Admitting: Family Medicine

## 2020-04-21 VITALS — BP 158/86 | HR 56 | Temp 96.4°F | Ht 70.0 in | Wt 229.0 lb

## 2020-04-21 DIAGNOSIS — E119 Type 2 diabetes mellitus without complications: Secondary | ICD-10-CM

## 2020-04-21 DIAGNOSIS — I10 Essential (primary) hypertension: Secondary | ICD-10-CM | POA: Diagnosis not present

## 2020-04-21 LAB — BASIC METABOLIC PANEL
BUN: 23 mg/dL (ref 6–23)
CO2: 31 mEq/L (ref 19–32)
Calcium: 9.4 mg/dL (ref 8.4–10.5)
Chloride: 98 mEq/L (ref 96–112)
Creatinine, Ser: 1.12 mg/dL (ref 0.40–1.50)
GFR: 67.39 mL/min (ref >60.00)
Glucose, Bld: 87 mg/dL (ref 70–99)
Potassium: 3.9 mEq/L (ref 3.5–5.1)
Sodium: 136 mEq/L (ref 135–145)

## 2020-04-21 MED ORDER — AMLODIPINE BESYLATE 5 MG PO TABS: 5.0000 mg | ORAL_TABLET | Freq: Every day | ORAL | 3 refills | Status: DC

## 2020-04-21 NOTE — Assessment & Plan Note (Signed)
Doing weight watchers and working on weight loss. Cont metformin, not due to HgbA1c

## 2020-04-21 NOTE — Patient Instructions (Signed)
#  Blood pressure - continue lisinopril and Ziac - Start Amlodipine 5 mg daily - if after 2 weeks blood pressure is still high - Increase Amlodipine to 10 mg daily  - Return in 4 weeks   Your blood pressure high.   High blood pressure increases your risk for heart attack and stroke.    Please check your blood pressure 2-4 times a week.   To check your blood pressure 1) Sit in a quiet and relaxed place for 5 minutes 2) Make sure your feet are flat on the ground 3) Consider checking first thing in the morning   Normal blood pressure is less than 140/90 Ideally you blood pressure should be around 120/80  Other ways you can reduce your blood pressure:  1) Regular exercise -- Try to get 150 minutes (30 minutes, 5 days a week) of moderate to vigorous aerobic excercise -- Examples: brisk walking (2.5 miles per hour), water aerobics, dancing, gardening, tennis, biking slower than 10 miles per hour 2) DASH Diet - low fat meats, more fresh fruits and vegetables, whole grains, low salt 3) Quit smoking if you smoke 4) Loose 5-10% of your body weight

## 2020-04-21 NOTE — Assessment & Plan Note (Addendum)
BP remains elevated. Taking lisinopril and ziac. Discussed adding amlodipine as higher doses of lisinopril have not significantly lower BP. Return in 4 weeks. If BP still elevated on 4th agent will likely plan for renal US and consider cards consult. Or add spironolactone.

## 2020-04-21 NOTE — Progress Notes (Signed)
   Subjective:     Kevin Reid is a 59 y.o. male presenting for Hypertension (still having elevated b/p)     HPI  #HTN - has been doing weight watchers - is up to 30 mg of lisinopril daily  - continues to take bisoprolol-HCTZ - occasionally feeling tired - no cp, sob  #diabetes - doing better on diet - does not check his sugars as he doesn't have his meter anymore - interested in the libra meter - taking medication w/o issue  Review of Systems   Social History   Tobacco Use  Smoking Status Former Smoker  . Packs/day: 1.00  . Years: 8.00  . Pack years: 8.00  . Types: Cigarettes  . Quit date: 04/24/2018  . Years since quitting: 1.9  Smokeless Tobacco Former Neurosurgeon  . Types: Chew  . Quit date: 11/19/2013        Objective:    BP Readings from Last 3 Encounters:  04/21/20 (!) 158/86  04/06/20 (!) 168/88  03/15/20 (!) 168/87   Wt Readings from Last 3 Encounters:  04/21/20 229 lb (103.9 kg)  04/06/20 229 lb (103.9 kg)  03/15/20 231 lb (104.8 kg)    BP (!) 158/86   Pulse (!) 56   Temp (!) 96.4 F (35.8 C)   Ht 5\' 10"  (1.778 m)   Wt 229 lb (103.9 kg)   BMI 32.86 kg/m    Physical Exam Constitutional:      Appearance: Normal appearance. He is not ill-appearing or diaphoretic.  HENT:     Right Ear: External ear normal.     Left Ear: External ear normal.  Eyes:     General: No scleral icterus.    Extraocular Movements: Extraocular movements intact.     Conjunctiva/sclera: Conjunctivae normal.  Cardiovascular:     Rate and Rhythm: Normal rate and regular rhythm.     Heart sounds: No murmur.  Pulmonary:     Effort: Pulmonary effort is normal. No respiratory distress.     Breath sounds: Normal breath sounds. No wheezing.  Musculoskeletal:     Cervical back: Neck supple.  Skin:    General: Skin is warm and dry.  Neurological:     Mental Status: He is alert. Mental status is at baseline.  Psychiatric:        Mood and Affect: Mood normal.            Assessment & Plan:   Problem List Items Addressed This Visit      Cardiovascular and Mediastinum   Hypertension - Primary    BP remains elevated. Taking lisinopril and ziac. Discussed adding amlodipine as higher doses of lisinopril have not significantly lower BP. Return in 4 weeks. If BP still elevated on 4th agent will likely plan for renal and consider cards consult. Or add spironolactone.       Relevant Medications   amLODipine (NORVASC) 5 MG tablet   Other Relevant Orders   Basic metabolic panel     Endocrine   Diabetes mellitus, type 2 (HCC)    Doing weight watchers and working on weight loss. Cont metformin, not due to HgbA1c          Return in about 4 weeks (around 05/19/2020).  05/21/2020, MD

## 2020-04-22 ENCOUNTER — Ambulatory Visit (INDEPENDENT_AMBULATORY_CARE_PROVIDER_SITE_OTHER): Payer: 59 | Admitting: Family Medicine

## 2020-05-12 ENCOUNTER — Other Ambulatory Visit (INDEPENDENT_AMBULATORY_CARE_PROVIDER_SITE_OTHER): Payer: Self-pay | Admitting: Family Medicine

## 2020-05-12 DIAGNOSIS — E559 Vitamin D deficiency, unspecified: Secondary | ICD-10-CM

## 2020-05-14 ENCOUNTER — Other Ambulatory Visit: Payer: Self-pay | Admitting: Family Medicine

## 2020-05-27 DIAGNOSIS — M75101 Unspecified rotator cuff tear or rupture of right shoulder, not specified as traumatic: Secondary | ICD-10-CM | POA: Diagnosis not present

## 2020-05-27 DIAGNOSIS — Z01818 Encounter for other preprocedural examination: Secondary | ICD-10-CM | POA: Diagnosis not present

## 2020-05-27 DIAGNOSIS — M25511 Pain in right shoulder: Secondary | ICD-10-CM | POA: Diagnosis not present

## 2020-05-31 ENCOUNTER — Encounter: Payer: Self-pay | Admitting: Family Medicine

## 2020-05-31 ENCOUNTER — Other Ambulatory Visit: Payer: Self-pay | Admitting: Family Medicine

## 2020-05-31 DIAGNOSIS — I10 Essential (primary) hypertension: Secondary | ICD-10-CM

## 2020-05-31 DIAGNOSIS — E1169 Type 2 diabetes mellitus with other specified complication: Secondary | ICD-10-CM

## 2020-06-01 MED ORDER — LISINOPRIL 30 MG PO TABS: 30.0000 mg | ORAL_TABLET | Freq: Every day | ORAL | 1 refills | Status: DC

## 2020-06-01 NOTE — Telephone Encounter (Signed)
Please have patient schedule f/u appointment. 1 month refill provided

## 2020-06-01 NOTE — Telephone Encounter (Signed)
Last office visit 04/21/2020 for HTN.  AVS states to return in 4 weeks but no future appointment are scheduled.  Refill or schedule office visit.

## 2020-06-03 DIAGNOSIS — I10 Essential (primary) hypertension: Secondary | ICD-10-CM | POA: Diagnosis not present

## 2020-06-03 DIAGNOSIS — E782 Mixed hyperlipidemia: Secondary | ICD-10-CM | POA: Diagnosis not present

## 2020-06-03 DIAGNOSIS — R0602 Shortness of breath: Secondary | ICD-10-CM | POA: Diagnosis not present

## 2020-06-03 DIAGNOSIS — I208 Other forms of angina pectoris: Secondary | ICD-10-CM | POA: Diagnosis not present

## 2020-06-07 MED ORDER — OZEMPIC (0.25 OR 0.5 MG/DOSE) 2 MG/1.5ML ~~LOC~~ SOPN: PEN_INJECTOR | SUBCUTANEOUS | 0 refills | Status: DC

## 2020-06-07 NOTE — Telephone Encounter (Signed)
Ms Kevin Reid left v/m that Dr Kevin Reid had sent in ED fill to [facility]. When went to pick up ozempic the ins would not approve and cost to pt was going to be $600.00. pt is on another diabetic pill and wants to know if can miss a dose of ozempic so will not have to pay $600.00 when pt has med at home. Ms Kevin Reid request cb.

## 2020-06-07 NOTE — Telephone Encounter (Signed)
Should be OK to miss 1 week.   Would recommend focusing on diabetes diet while on vacation to make up for missing medication.

## 2020-06-07 NOTE — Addendum Note (Signed)
Addended by: Damita Lack on: 06/07/2020 11:17 AM   Modules accepted: Orders

## 2020-06-15 DIAGNOSIS — Z01812 Encounter for preprocedural laboratory examination: Secondary | ICD-10-CM | POA: Diagnosis not present

## 2020-06-15 DIAGNOSIS — Z20822 Contact with and (suspected) exposure to covid-19: Secondary | ICD-10-CM | POA: Diagnosis not present

## 2020-06-17 DIAGNOSIS — E7800 Pure hypercholesterolemia, unspecified: Secondary | ICD-10-CM | POA: Diagnosis not present

## 2020-06-17 DIAGNOSIS — R69 Illness, unspecified: Secondary | ICD-10-CM | POA: Diagnosis not present

## 2020-06-17 DIAGNOSIS — M75101 Unspecified rotator cuff tear or rupture of right shoulder, not specified as traumatic: Secondary | ICD-10-CM | POA: Diagnosis not present

## 2020-06-17 DIAGNOSIS — S46111A Strain of muscle, fascia and tendon of long head of biceps, right arm, initial encounter: Secondary | ICD-10-CM | POA: Diagnosis not present

## 2020-06-17 DIAGNOSIS — M24111 Other articular cartilage disorders, right shoulder: Secondary | ICD-10-CM | POA: Diagnosis not present

## 2020-06-17 DIAGNOSIS — M7521 Bicipital tendinitis, right shoulder: Secondary | ICD-10-CM | POA: Diagnosis not present

## 2020-06-17 DIAGNOSIS — G8918 Other acute postprocedural pain: Secondary | ICD-10-CM | POA: Diagnosis not present

## 2020-06-17 DIAGNOSIS — M7551 Bursitis of right shoulder: Secondary | ICD-10-CM | POA: Diagnosis not present

## 2020-06-17 DIAGNOSIS — M75111 Incomplete rotator cuff tear or rupture of right shoulder, not specified as traumatic: Secondary | ICD-10-CM | POA: Diagnosis not present

## 2020-06-17 DIAGNOSIS — G4733 Obstructive sleep apnea (adult) (pediatric): Secondary | ICD-10-CM | POA: Diagnosis not present

## 2020-06-17 DIAGNOSIS — M25511 Pain in right shoulder: Secondary | ICD-10-CM | POA: Diagnosis not present

## 2020-06-17 DIAGNOSIS — M19011 Primary osteoarthritis, right shoulder: Secondary | ICD-10-CM | POA: Diagnosis not present

## 2020-06-17 DIAGNOSIS — R600 Localized edema: Secondary | ICD-10-CM | POA: Diagnosis not present

## 2020-06-17 DIAGNOSIS — M75121 Complete rotator cuff tear or rupture of right shoulder, not specified as traumatic: Secondary | ICD-10-CM | POA: Diagnosis not present

## 2020-06-17 DIAGNOSIS — M7541 Impingement syndrome of right shoulder: Secondary | ICD-10-CM | POA: Diagnosis not present

## 2020-06-17 DIAGNOSIS — M7501 Adhesive capsulitis of right shoulder: Secondary | ICD-10-CM | POA: Diagnosis not present

## 2020-06-17 DIAGNOSIS — E119 Type 2 diabetes mellitus without complications: Secondary | ICD-10-CM | POA: Diagnosis not present

## 2020-06-29 ENCOUNTER — Telehealth: Payer: Self-pay | Admitting: Family Medicine

## 2020-06-29 DIAGNOSIS — I10 Essential (primary) hypertension: Secondary | ICD-10-CM

## 2020-06-29 MED ORDER — AMLODIPINE BESYLATE 10 MG PO TABS: 10.0000 mg | ORAL_TABLET | Freq: Every day | ORAL | 0 refills | Status: AC

## 2020-06-29 NOTE — Telephone Encounter (Signed)
Spoke to pt and advised of Dr. Elmyra Ricks comments. Pt is transferring care due to insurance change and declined follow up appt.

## 2020-06-29 NOTE — Addendum Note (Signed)
Addended by: Gweneth Dimitri R on: 06/29/2020 12:41 PM   Modules accepted: Orders

## 2020-06-29 NOTE — Telephone Encounter (Signed)
Pt is overdue for follow-up visit.   1 month supply of Amlodipine 10 mg will be sent to pharmacy. Will need to take 1 pill daily. However, BP was elevated at last visit and recommended 1 month f/u in April

## 2020-06-29 NOTE — Telephone Encounter (Signed)
Pt is requesting that his prescription say take two pills instead of 1 of the the amlodipine prescription. The pharmacy is requesting a call back if prescription can be changed or just send in new prescription.

## 2020-07-07 ENCOUNTER — Other Ambulatory Visit: Payer: Self-pay

## 2020-07-07 MED ORDER — BISOPROLOL-HYDROCHLOROTHIAZIDE 5-6.25 MG PO TABS: ORAL_TABLET | ORAL | 1 refills | Status: DC

## 2020-07-08 ENCOUNTER — Other Ambulatory Visit: Payer: Self-pay | Admitting: Family Medicine

## 2020-07-16 DIAGNOSIS — M75101 Unspecified rotator cuff tear or rupture of right shoulder, not specified as traumatic: Secondary | ICD-10-CM | POA: Diagnosis not present

## 2020-07-19 DIAGNOSIS — M75101 Unspecified rotator cuff tear or rupture of right shoulder, not specified as traumatic: Secondary | ICD-10-CM | POA: Diagnosis not present

## 2020-07-27 DIAGNOSIS — M75101 Unspecified rotator cuff tear or rupture of right shoulder, not specified as traumatic: Secondary | ICD-10-CM | POA: Diagnosis not present

## 2020-07-29 DIAGNOSIS — I208 Other forms of angina pectoris: Secondary | ICD-10-CM | POA: Diagnosis not present

## 2020-07-29 DIAGNOSIS — R0602 Shortness of breath: Secondary | ICD-10-CM | POA: Diagnosis not present

## 2020-08-03 DIAGNOSIS — I10 Essential (primary) hypertension: Secondary | ICD-10-CM | POA: Diagnosis not present

## 2020-08-03 DIAGNOSIS — I208 Other forms of angina pectoris: Secondary | ICD-10-CM | POA: Diagnosis not present

## 2020-08-03 DIAGNOSIS — R001 Bradycardia, unspecified: Secondary | ICD-10-CM | POA: Diagnosis not present

## 2020-08-03 DIAGNOSIS — R0602 Shortness of breath: Secondary | ICD-10-CM | POA: Diagnosis not present

## 2020-08-05 DIAGNOSIS — M75101 Unspecified rotator cuff tear or rupture of right shoulder, not specified as traumatic: Secondary | ICD-10-CM | POA: Diagnosis not present

## 2020-08-09 DIAGNOSIS — M75101 Unspecified rotator cuff tear or rupture of right shoulder, not specified as traumatic: Secondary | ICD-10-CM | POA: Diagnosis not present

## 2020-08-12 DIAGNOSIS — M75101 Unspecified rotator cuff tear or rupture of right shoulder, not specified as traumatic: Secondary | ICD-10-CM | POA: Diagnosis not present

## 2020-08-16 DIAGNOSIS — M75101 Unspecified rotator cuff tear or rupture of right shoulder, not specified as traumatic: Secondary | ICD-10-CM | POA: Diagnosis not present

## 2020-08-19 DIAGNOSIS — M75101 Unspecified rotator cuff tear or rupture of right shoulder, not specified as traumatic: Secondary | ICD-10-CM | POA: Diagnosis not present

## 2020-08-26 DIAGNOSIS — M75101 Unspecified rotator cuff tear or rupture of right shoulder, not specified as traumatic: Secondary | ICD-10-CM | POA: Diagnosis not present

## 2020-08-31 ENCOUNTER — Other Ambulatory Visit: Payer: Self-pay | Admitting: Family Medicine

## 2020-08-31 DIAGNOSIS — I10 Essential (primary) hypertension: Secondary | ICD-10-CM

## 2020-09-02 DIAGNOSIS — M75101 Unspecified rotator cuff tear or rupture of right shoulder, not specified as traumatic: Secondary | ICD-10-CM | POA: Diagnosis not present

## 2020-09-14 DIAGNOSIS — M75101 Unspecified rotator cuff tear or rupture of right shoulder, not specified as traumatic: Secondary | ICD-10-CM | POA: Diagnosis not present

## 2020-09-17 DIAGNOSIS — M75101 Unspecified rotator cuff tear or rupture of right shoulder, not specified as traumatic: Secondary | ICD-10-CM | POA: Diagnosis not present

## 2020-09-20 DIAGNOSIS — M75101 Unspecified rotator cuff tear or rupture of right shoulder, not specified as traumatic: Secondary | ICD-10-CM | POA: Diagnosis not present

## 2020-09-21 DIAGNOSIS — E7800 Pure hypercholesterolemia, unspecified: Secondary | ICD-10-CM | POA: Diagnosis not present

## 2020-09-21 DIAGNOSIS — E119 Type 2 diabetes mellitus without complications: Secondary | ICD-10-CM | POA: Diagnosis not present

## 2020-09-21 DIAGNOSIS — R69 Illness, unspecified: Secondary | ICD-10-CM | POA: Diagnosis not present

## 2020-09-21 DIAGNOSIS — I10 Essential (primary) hypertension: Secondary | ICD-10-CM | POA: Diagnosis not present

## 2020-09-27 DIAGNOSIS — M75101 Unspecified rotator cuff tear or rupture of right shoulder, not specified as traumatic: Secondary | ICD-10-CM | POA: Diagnosis not present

## 2020-10-06 ENCOUNTER — Encounter: Payer: Self-pay | Admitting: *Deleted

## 2020-10-06 ENCOUNTER — Other Ambulatory Visit: Payer: Self-pay | Admitting: *Deleted

## 2020-10-06 NOTE — Patient Outreach (Signed)
[facility]) Care Management  10/06/2020  Kevin Reid 1961-09-26 [id number]   Referral Received: 09/28/20 Initial Outreach: 10/06/20 HTN Initiative  Telephone Assessment-Unsuccessful  RN attempted outreach call unsuccessful. RN able to leave a HIPAA approved voice message requested a call back.  Will rescheduled another outreach call next week for pending [facility] services.  [name], RN Care Management Coordinator [facility] Main Office [phone]

## 2020-10-11 DIAGNOSIS — Z20822 Contact with and (suspected) exposure to covid-19: Secondary | ICD-10-CM | POA: Diagnosis not present

## 2020-10-12 ENCOUNTER — Other Ambulatory Visit: Payer: Self-pay | Admitting: *Deleted

## 2020-10-12 NOTE — Patient Outreach (Signed)
[facility]) Care Management  10/12/2020  Kevin Reid 12/26/1960 [id number]   Telephone Assessment-Unsuccessful  Outreach#2 Unsuccessful with today's outreach. RN able to leave a HIPAA approved voice message requesting a call back.  Will rescheduled another outreach call over the next week.  [name], RN Care Management Coordinator [facility] Main Office [phone]

## 2020-10-18 ENCOUNTER — Other Ambulatory Visit: Payer: Self-pay | Admitting: *Deleted

## 2020-10-18 NOTE — Patient Outreach (Signed)
[facility]) Care Management  10/18/2020  Kevin Reid 02-15-1961 [id number]   Telephone Assessment-Enrollment-HTN  Spoke with pt today and explained [facility] services and the purpose for today's call (pt receptive).  Pt reports and new PCP Dr. Derinda Late [facility]). Reports his blood pressures is a lot better then before with a new medication on board but he does not remember the exact name but will obtain and provide later. RN has offer to assist pt further with his HTN enrolling him into the [facility] disease management program for complex case management (pt receptive). Telephone Assessment completed today and available medications were reviewed. Pt educated on HTN and possible encounters with signs and symptoms however pt aware there maybe no symptoms during a crises with HTN. Explained the progress of enrollment and frequency of outreach call along wit educational information that will be mailed to his home address.   Plan of care generated  And discussed in detail all goals and interventions as pt able to comply with all discussed today. Pt states he has recent changed his diet to no salt, cut back on caffeinated drinks and no pork with low carbohydrate and high protein food items. Pt states he continues to try and be more active but not with good tolerance. Reports last blood pressures reading 130/84 since starting the new medication. Pt has committed to taking his blood pressure daily and has a home device. Pt also has diabetes better controlled and indicated upon screening that he will continue his eye doctor for an annual appointment and does not need assistance with this task.   Will notify his new provider of pt's enrollment, send outreach letter and the welcome letter. Will also send HTN packet with available emmi to further assist pt with managing his condition.  Goals Addressed            This Visit's Progress   . THN-Lifestyle Change       Follow Up Date 11/23/2020     - agree to work together to make changes - ask questions to understand - learn about high blood pressure    Why is this important?   The changes that you are asked to make may be hard to do.  This is especially true when the changes are life-long.  Knowing why it is important to you is the first step.  Working on the change with your family or support person helps you not feel alone.  Reward yourself and family or support person when goals are met. This can be an activity you choose like bowling, hiking, biking, swimming or shooting hoops.     Notes:     . THN-Stop or Cut Down Having Caffeine       Follow Up Date 11/23/2020   - change drinks with caffeine to those without caffeine - reduce caffeine intake a little at a time    Why is this important?   If you want to decrease the amount of caffeine there are steps you can take to do it.  First, learn to read labels to see if a food or drink contains caffeine. Then, take steps to cut back or stop.    Notes:     . THN-Track and Manage My Blood Pressure       Follow Up Date 01/25/2020    - check blood pressure daily - choose a place to take my blood pressure (home, clinic or office, retail store) - write blood pressure results in a log or  diary    Why is this important?   You won't feel high blood pressure, but it can still hurt your blood vessels.  High blood pressure can cause heart or kidney problems. It can also cause a stroke.  Making lifestyle changes like losing a little weight or eating less salt will help.  Checking your blood pressure at home and at different times of the day can help to control blood pressure.  If the doctor prescribes medicine remember to take it the way the doctor ordered.  Call the office if you cannot afford the medicine or if there are questions about it.     Notes:        Raina Mina, RN Care Management Coordinator [place] Office [phone]

## 2020-11-02 ENCOUNTER — Other Ambulatory Visit: Payer: Self-pay | Admitting: *Deleted

## 2020-11-02 NOTE — Patient Outreach (Signed)
[facility]) Care Management  11/02/2020  Kevin Reid 1961-08-22 [id number]   Telephone Assessment  Unsuccessful 2 week follow up outreach. RN left a HIPAA voice message requesting a call back.   Will rescheduled another outreach call accordingly over the next week.  [name], RN Care Management Coordinator [facility] Main Office [phone]

## 2020-11-05 ENCOUNTER — Other Ambulatory Visit: Payer: Self-pay | Admitting: *Deleted

## 2020-11-05 NOTE — Patient Outreach (Signed)
[facility]) Care Management  11/05/2020  Kevin Reid 09-20-1961 [id number]  Telephone Assessment-Successful (2week follow up)  RN spoke with pt today and received an update on he ongoing management of care. Pt reports he is taking his blood pressures daily with readings at 150-147/95-85. Pt has incorporated daily walks up to 10,000 steps and changes in his diet with an increase in his water intake. Pt moving this month and al ittle stress with his blood pressures slightly higher then his normal. RN stress to work in moderation and take rest periods along with taking his medications as prescribed.   Reviewed and discuss the current plan of care in place and noted all changes as pt encouraged to continue his ongoing adherence based upon the goals and interventions discussed today. No request or additional needs at this time.  Will follow up next month with pt's ongoing management of care.  Goals Addressed            This Visit's Progress   . THN-Lifestyle Change   On track    Follow Up Date 12/23/2020    - agree to work together to make changes - ask questions to understand - learn about high blood pressure    Why is this important?   The changes that you are asked to make may be hard to do.  This is especially true when the changes are life-long.  Knowing why it is important to you is the first step.  Working on the change with your family or support person helps you not feel alone.  Reward yourself and family or support person when goals are met. This can be an activity you choose like bowling, hiking, biking, swimming or shooting hoops.     Notes: monitoring blood pressure daily and incorporating walking daily to 10,000 steps    . THN-Stop or Cut Down Having Caffeine   On track    Follow Up Date 12/23/2020   - change drinks with caffeine to those without caffeine - reduce caffeine intake a little at a time    Why is this important?   If you want to  decrease the amount of caffeine there are steps you can take to do it.  First, learn to read labels to see if a food or drink contains caffeine. Then, take steps to cut back or stop.    Notes: Increasing water intake and cutting back on unhealthy    . THN-Track and Manage My Blood Pressure   On track    Follow Up Date 01/25/2020    - check blood pressure daily - choose a place to take my blood pressure (home, clinic or office, retail store) - write blood pressure results in a log or diary    Why is this important?   You won't feel high blood pressure, but it can still hurt your blood vessels.  High blood pressure can cause heart or kidney problems. It can also cause a stroke.  Making lifestyle changes like losing a little weight or eating less salt will help.  Checking your blood pressure at home and at different times of the day can help to control blood pressure.  If the doctor prescribes medicine remember to take it the way the doctor ordered.  Call the office if you cannot afford the medicine or if there are questions about it.     Notes: Reports daily blood pressures. Encouraged pt to document all reading in the [facility] calendar for providers to see.  Raina Mina, RN Care Management Coordinator [place] Office [phone]

## 2020-11-29 ENCOUNTER — Other Ambulatory Visit: Payer: Self-pay

## 2020-11-29 MED ORDER — METFORMIN HCL 1000 MG PO TABS: 1000.0000 mg | ORAL_TABLET | Freq: Two times a day (BID) | ORAL | 1 refills | Status: AC

## 2020-12-16 ENCOUNTER — Other Ambulatory Visit: Payer: Self-pay | Admitting: *Deleted

## 2020-12-16 NOTE — Patient Outreach (Signed)
[facility]) Care Management  12/16/2020  Kevin Reid 02-15-1961 [id number]   Telephone Assessment-Unsuccessful  RN attempted outreach call however unsuccessful. Able to leave a HIPAA approved voice message requesting a call back.   Will attempted another outreach over the next week for ongoing [facility] services.   [name], RN Care Management Coordinator [facility] Main Office [phone]

## 2020-12-22 ENCOUNTER — Other Ambulatory Visit: Payer: 59

## 2020-12-22 DIAGNOSIS — Z20822 Contact with and (suspected) exposure to covid-19: Secondary | ICD-10-CM | POA: Diagnosis not present

## 2020-12-22 DIAGNOSIS — U071 COVID-19: Secondary | ICD-10-CM | POA: Diagnosis not present

## 2020-12-23 ENCOUNTER — Other Ambulatory Visit: Payer: Self-pay | Admitting: *Deleted

## 2020-12-23 NOTE — Patient Outreach (Signed)
[facility]) Care Management  12/23/2020  Calden Dorsey Sep 08, 1961 [id number]  Telephone Assessment-Successful-HTN  RN spoke with the pt's spouse today Venida Jarvis) who provided an update on pt's ongoing management of care. Reports pt has made good progress in managing his care with adjusting his dietary habits and monitoring his blood pressures. Verified pt has been adherent with all his medications as reviewed today and medical appointments with none missed. No acute issues have arise as pt remains adherent to the current plan of care.  Plan of care reviewed and discussed with noted updates as pt remains on track. Discussed all goals and interventions and continue to offer [facility] services for ongoing case management . Will follow up once again next month on pt's ongoing progress related to managing his ongoing HTN.    Goals Addressed            This Visit's Progress   . THN-Lifestyle Change   On track    Follow Up Date 01/24/2021 Timeframe:  Long-Range Goal Priority:  Medium Start Date:    12/23/2020                         Expected End Date:  03/24/2021                  - agree to work together to make changes - ask questions to understand - learn about high blood pressure    Why is this important?   The changes that you are asked to make may be hard to do.  This is especially true when the changes are life-long.  Knowing why it is important to you is the first step.  Working on the change with your family or support person helps you not feel alone.  Reward yourself and family or support person when goals are met. This can be an activity you choose like bowling, hiking, biking, swimming or shooting hoops.     Notes: Spouse reports pt continue to monitor his blood pressures documenting readings on his phone. Pt continues to improve in his habits in managing his ongoing HTN. Will continue to encouraged ongoing exercises to improve his overall health. Will extend to  allow ongoing adherence.    . THN-Stop or Cut Down Having Caffeine   On track    Follow Up Date 01/24/2021 Timeframe:  Short-Term Goal Priority:  Medium Start Date:        12/23/2020                     Expected End Date:     01/24/2021                    - change drinks with caffeine to those without caffeine - reduce caffeine intake a little at a time    Why is this important?   If you want to decrease the amount of caffeine there are steps you can take to do it.  First, learn to read labels to see if a food or drink contains caffeine. Then, take steps to cut back or stop.    Notes: Spouse continue to encouraged pt to Increase his water intake. Confirm pt has improved with his dietary intake related t his blood pressure.    Baker Pierini and Manage My Blood Pressure   On track    Follow Up Date 01/24/2021 Timeframe:  Long-Range Goal Priority:  Medium Start Date:  12/23/2020                    Expected End Date:  01/24/2021                    - check blood pressure daily - choose a place to take my blood pressure (home, clinic or office, retail store) - write blood pressure results in a log or diary    Why is this important?   You won't feel high blood pressure, but it can still hurt your blood vessels.  High blood pressure can cause heart or kidney problems. It can also cause a stroke.  Making lifestyle changes like losing a little weight or eating less salt will help.  Checking your blood pressure at home and at different times of the day can help to control blood pressure.  If the doctor prescribes medicine remember to take it the way the doctor ordered.  Call the office if you cannot afford the medicine or if there are questions about it.     Notes: Reports daily blood pressures. Encouraged pt to document all reading in the [facility] calendar for providers to see.       Raina Mina, RN Care Management Coordinator [place] Office [phone]

## 2020-12-27 DIAGNOSIS — U071 COVID-19: Secondary | ICD-10-CM | POA: Diagnosis not present

## 2020-12-27 DIAGNOSIS — J208 Acute bronchitis due to other specified organisms: Secondary | ICD-10-CM | POA: Diagnosis not present

## 2020-12-28 ENCOUNTER — Emergency Department (HOSPITAL_COMMUNITY): Payer: 59

## 2020-12-28 ENCOUNTER — Inpatient Hospital Stay (HOSPITAL_COMMUNITY)
Admission: EM | Admit: 2020-12-28 | Discharge: 2021-01-02 | DRG: 177 | Disposition: A | Discharge status: Home / Self Care | Payer: 59 | Attending: Family Medicine | Admitting: Family Medicine

## 2020-12-28 ENCOUNTER — Other Ambulatory Visit: Payer: Self-pay

## 2020-12-28 DIAGNOSIS — I10 Essential (primary) hypertension: Secondary | ICD-10-CM | POA: Diagnosis present

## 2020-12-28 DIAGNOSIS — E1169 Type 2 diabetes mellitus with other specified complication: Secondary | ICD-10-CM

## 2020-12-28 DIAGNOSIS — E876 Hypokalemia: Secondary | ICD-10-CM | POA: Diagnosis present

## 2020-12-28 DIAGNOSIS — F32A Depression, unspecified: Secondary | ICD-10-CM | POA: Diagnosis present

## 2020-12-28 DIAGNOSIS — Z8249 Family history of ischemic heart disease and other diseases of the circulatory system: Secondary | ICD-10-CM

## 2020-12-28 DIAGNOSIS — U071 COVID-19: Secondary | ICD-10-CM | POA: Diagnosis not present

## 2020-12-28 DIAGNOSIS — R0602 Shortness of breath: Secondary | ICD-10-CM | POA: Diagnosis not present

## 2020-12-28 DIAGNOSIS — J9601 Acute respiratory failure with hypoxia: Secondary | ICD-10-CM | POA: Diagnosis present

## 2020-12-28 DIAGNOSIS — Z888 Allergy status to other drugs, medicaments and biological substances status: Secondary | ICD-10-CM

## 2020-12-28 DIAGNOSIS — J1282 Pneumonia due to coronavirus disease 2019: Secondary | ICD-10-CM | POA: Diagnosis present

## 2020-12-28 DIAGNOSIS — Z823 Family history of stroke: Secondary | ICD-10-CM

## 2020-12-28 DIAGNOSIS — Z87891 Personal history of nicotine dependence: Secondary | ICD-10-CM

## 2020-12-28 DIAGNOSIS — Z7982 Long term (current) use of aspirin: Secondary | ICD-10-CM

## 2020-12-28 DIAGNOSIS — R059 Cough, unspecified: Secondary | ICD-10-CM | POA: Diagnosis not present

## 2020-12-28 DIAGNOSIS — E119 Type 2 diabetes mellitus without complications: Secondary | ICD-10-CM

## 2020-12-28 DIAGNOSIS — Z8042 Family history of malignant neoplasm of prostate: Secondary | ICD-10-CM

## 2020-12-28 DIAGNOSIS — Z83438 Family history of other disorder of lipoprotein metabolism and other lipidemia: Secondary | ICD-10-CM

## 2020-12-28 DIAGNOSIS — Z79899 Other long term (current) drug therapy: Secondary | ICD-10-CM

## 2020-12-28 DIAGNOSIS — E1165 Type 2 diabetes mellitus with hyperglycemia: Secondary | ICD-10-CM | POA: Diagnosis present

## 2020-12-28 DIAGNOSIS — E669 Obesity, unspecified: Secondary | ICD-10-CM | POA: Diagnosis present

## 2020-12-28 DIAGNOSIS — E871 Hypo-osmolality and hyponatremia: Secondary | ICD-10-CM | POA: Diagnosis present

## 2020-12-28 DIAGNOSIS — G4730 Sleep apnea, unspecified: Secondary | ICD-10-CM | POA: Diagnosis present

## 2020-12-28 DIAGNOSIS — Z885 Allergy status to narcotic agent status: Secondary | ICD-10-CM

## 2020-12-28 DIAGNOSIS — E785 Hyperlipidemia, unspecified: Secondary | ICD-10-CM | POA: Diagnosis present

## 2020-12-28 DIAGNOSIS — Z6834 Body mass index (BMI) 34.0-34.9, adult: Secondary | ICD-10-CM

## 2020-12-28 DIAGNOSIS — Z800 Family history of malignant neoplasm of digestive organs: Secondary | ICD-10-CM

## 2020-12-28 DIAGNOSIS — Z7984 Long term (current) use of oral hypoglycemic drugs: Secondary | ICD-10-CM

## 2020-12-28 DIAGNOSIS — G4733 Obstructive sleep apnea (adult) (pediatric): Secondary | ICD-10-CM | POA: Diagnosis present

## 2020-12-28 NOTE — ED Triage Notes (Addendum)
Pt states that he was dx with COVID on last Wednesday via [facility]. He says that his cough is worse and he is worried about his oxygen because he is unsure if his oximeter is reading correct. It was reading 89%. Low grade temp. Shortness of breath with exertion.     Pt ambulated from lobby area to triage room, immediate assessment of pulse ox on room air on arrival to room after ambulation 94%

## 2020-12-29 DIAGNOSIS — J1282 Pneumonia due to coronavirus disease 2019: Secondary | ICD-10-CM | POA: Diagnosis not present

## 2020-12-29 DIAGNOSIS — J9601 Acute respiratory failure with hypoxia: Secondary | ICD-10-CM | POA: Diagnosis not present

## 2020-12-29 DIAGNOSIS — Z7982 Long term (current) use of aspirin: Secondary | ICD-10-CM | POA: Diagnosis not present

## 2020-12-29 DIAGNOSIS — E871 Hypo-osmolality and hyponatremia: Secondary | ICD-10-CM | POA: Diagnosis not present

## 2020-12-29 DIAGNOSIS — Z6834 Body mass index (BMI) 34.0-34.9, adult: Secondary | ICD-10-CM | POA: Diagnosis not present

## 2020-12-29 DIAGNOSIS — E119 Type 2 diabetes mellitus without complications: Secondary | ICD-10-CM | POA: Diagnosis not present

## 2020-12-29 DIAGNOSIS — Z8042 Family history of malignant neoplasm of prostate: Secondary | ICD-10-CM | POA: Diagnosis not present

## 2020-12-29 DIAGNOSIS — F32A Depression, unspecified: Secondary | ICD-10-CM | POA: Diagnosis present

## 2020-12-29 DIAGNOSIS — Z8249 Family history of ischemic heart disease and other diseases of the circulatory system: Secondary | ICD-10-CM | POA: Diagnosis not present

## 2020-12-29 DIAGNOSIS — E876 Hypokalemia: Secondary | ICD-10-CM | POA: Diagnosis present

## 2020-12-29 DIAGNOSIS — Z7984 Long term (current) use of oral hypoglycemic drugs: Secondary | ICD-10-CM | POA: Diagnosis not present

## 2020-12-29 DIAGNOSIS — Z885 Allergy status to narcotic agent status: Secondary | ICD-10-CM | POA: Diagnosis not present

## 2020-12-29 DIAGNOSIS — E785 Hyperlipidemia, unspecified: Secondary | ICD-10-CM | POA: Diagnosis not present

## 2020-12-29 DIAGNOSIS — Z79899 Other long term (current) drug therapy: Secondary | ICD-10-CM | POA: Diagnosis not present

## 2020-12-29 DIAGNOSIS — U071 COVID-19: Secondary | ICD-10-CM | POA: Diagnosis not present

## 2020-12-29 DIAGNOSIS — Z87891 Personal history of nicotine dependence: Secondary | ICD-10-CM | POA: Diagnosis not present

## 2020-12-29 DIAGNOSIS — Z800 Family history of malignant neoplasm of digestive organs: Secondary | ICD-10-CM | POA: Diagnosis not present

## 2020-12-29 DIAGNOSIS — G4733 Obstructive sleep apnea (adult) (pediatric): Secondary | ICD-10-CM | POA: Diagnosis not present

## 2020-12-29 DIAGNOSIS — Z888 Allergy status to other drugs, medicaments and biological substances status: Secondary | ICD-10-CM | POA: Diagnosis not present

## 2020-12-29 DIAGNOSIS — I10 Essential (primary) hypertension: Secondary | ICD-10-CM | POA: Diagnosis not present

## 2020-12-29 DIAGNOSIS — E669 Obesity, unspecified: Secondary | ICD-10-CM | POA: Diagnosis present

## 2020-12-29 DIAGNOSIS — Z823 Family history of stroke: Secondary | ICD-10-CM | POA: Diagnosis not present

## 2020-12-29 DIAGNOSIS — E1165 Type 2 diabetes mellitus with hyperglycemia: Secondary | ICD-10-CM | POA: Diagnosis not present

## 2020-12-29 DIAGNOSIS — Z83438 Family history of other disorder of lipoprotein metabolism and other lipidemia: Secondary | ICD-10-CM | POA: Diagnosis not present

## 2020-12-29 LAB — CBC WITH DIFFERENTIAL/PLATELET
Abs Immature Granulocytes: 0.03 10*3/uL (ref 0.00–0.07)
Basophils Absolute: 0 10*3/uL (ref 0.0–0.1)
Basophils Relative: 0 %
Eosinophils Absolute: 0 10*3/uL (ref 0.0–0.5)
Eosinophils Relative: 0 %
HCT: 36 % — ABNORMAL LOW (ref 39.0–52.0)
Hemoglobin: 13 g/dL (ref 13.0–17.0)
Immature Granulocytes: 1 %
Lymphocytes Relative: 10 %
Lymphs Abs: 0.6 10*3/uL — ABNORMAL LOW (ref 0.7–4.0)
MCH: 31.4 pg (ref 26.0–34.0)
MCHC: 36.1 g/dL — ABNORMAL HIGH (ref 30.0–36.0)
MCV: 87 fL (ref 80.0–100.0)
Monocytes Absolute: 0.4 10*3/uL (ref 0.1–1.0)
Monocytes Relative: 8 %
Neutro Abs: 4.4 10*3/uL (ref 1.7–7.7)
Neutrophils Relative %: 81 %
Platelets: 130 10*3/uL — ABNORMAL LOW (ref 150–400)
RBC: 4.14 MIL/uL — ABNORMAL LOW (ref 4.22–5.81)
RDW: 12.2 % (ref 11.5–15.5)
WBC: 5.4 10*3/uL (ref 4.0–10.5)
nRBC: 0 % (ref 0.0–0.2)

## 2020-12-29 LAB — RESP PANEL BY RT-PCR (FLU A&B, COVID) ARPGX2
Influenza A by PCR: NEGATIVE
Influenza B by PCR: NEGATIVE
SARS Coronavirus 2 by RT PCR: POSITIVE — AB

## 2020-12-29 LAB — FERRITIN: Ferritin: 529 ng/mL — ABNORMAL HIGH (ref 24–336)

## 2020-12-29 LAB — LACTATE DEHYDROGENASE: LDH: 171 U/L (ref 98–192)

## 2020-12-29 LAB — COMPREHENSIVE METABOLIC PANEL
ALT: 20 U/L (ref 0–44)
AST: 19 U/L (ref 15–41)
Albumin: 3.2 g/dL — ABNORMAL LOW (ref 3.5–5.0)
Alkaline Phosphatase: 31 U/L — ABNORMAL LOW (ref 38–126)
Anion gap: 13 (ref 5–15)
BUN: 27 mg/dL — ABNORMAL HIGH (ref 6–20)
CO2: 23 mmol/L (ref 22–32)
Calcium: 8.5 mg/dL — ABNORMAL LOW (ref 8.9–10.3)
Chloride: 99 mmol/L (ref 98–111)
Creatinine, Ser: 1.22 mg/dL (ref 0.61–1.24)
GFR, Estimated: >60 mL/min (ref >60)
Glucose, Bld: 167 mg/dL — ABNORMAL HIGH (ref 70–99)
Potassium: 3 mmol/L — ABNORMAL LOW (ref 3.5–5.1)
Sodium: 135 mmol/L (ref 135–145)
Total Bilirubin: 0.7 mg/dL (ref 0.3–1.2)
Total Protein: 6.3 g/dL — ABNORMAL LOW (ref 6.5–8.1)

## 2020-12-29 LAB — HEMOGLOBIN A1C
Hgb A1c MFr Bld: 7.2 % — ABNORMAL HIGH (ref 4.8–5.6)
Mean Plasma Glucose: 159.94 mg/dL

## 2020-12-29 LAB — CBG MONITORING, ED
Glucose-Capillary: 321 mg/dL — ABNORMAL HIGH (ref 70–99)
Glucose-Capillary: 249 mg/dL — ABNORMAL HIGH (ref 70–99)

## 2020-12-29 LAB — LACTIC ACID, PLASMA: Lactic Acid, Venous: 1.2 mmol/L (ref 0.5–1.9)

## 2020-12-29 LAB — C-REACTIVE PROTEIN: CRP: 14.3 mg/dL — ABNORMAL HIGH (ref <1.0)

## 2020-12-29 LAB — TRIGLYCERIDES: Triglycerides: 209 mg/dL — ABNORMAL HIGH (ref <150)

## 2020-12-29 LAB — D-DIMER, QUANTITATIVE: D-Dimer, Quant: 0.64 ug/mL-FEU — ABNORMAL HIGH (ref 0.00–0.50)

## 2020-12-29 LAB — FIBRINOGEN: Fibrinogen: 562 mg/dL — ABNORMAL HIGH (ref 210–475)

## 2020-12-29 LAB — PROCALCITONIN: Procalcitonin: <0.1 ng/mL

## 2020-12-29 MED ORDER — ASPIRIN EC 81 MG PO TBEC
81.0000 mg | DELAYED_RELEASE_TABLET | Freq: Every day | ORAL | Status: DC
  Administered 2020-12-29 – 2021-01-02 (×5): 81 mg via ORAL
  Filled 2020-12-29 (×5): qty 1

## 2020-12-29 MED ORDER — LISINOPRIL 20 MG PO TABS: 30.0000 mg | ORAL_TABLET | Freq: Every day | ORAL | Status: DC

## 2020-12-29 MED ORDER — BISOPROLOL FUMARATE 5 MG PO TABS
5.0000 mg | ORAL_TABLET | Freq: Every day | ORAL | Status: DC
  Administered 2020-12-29 – 2021-01-02 (×4): 5 mg via ORAL
  Filled 2020-12-29 (×6): qty 1

## 2020-12-29 MED ORDER — SODIUM CHLORIDE 0.9% FLUSH
3.0000 mL | Freq: Two times a day (BID) | INTRAVENOUS | Status: DC
  Administered 2020-12-31 – 2021-01-01 (×2): 3 mL via INTRAVENOUS

## 2020-12-29 MED ORDER — BISACODYL 5 MG PO TBEC: 5.0000 mg | DELAYED_RELEASE_TABLET | Freq: Every day | ORAL | Status: DC | PRN

## 2020-12-29 MED ORDER — PAROXETINE HCL 20 MG PO TABS
60.0000 mg | ORAL_TABLET | Freq: Every day | ORAL | Status: DC
  Administered 2020-12-29 – 2021-01-02 (×5): 60 mg via ORAL
  Filled 2020-12-29 (×5): qty 3

## 2020-12-29 MED ORDER — SODIUM CHLORIDE 0.9% FLUSH: 3.0000 mL | INTRAVENOUS | Status: DC | PRN

## 2020-12-29 MED ORDER — INSULIN ASPART 100 UNIT/ML ~~LOC~~ SOLN
0.0000 [IU] | Freq: Every day | SUBCUTANEOUS | Status: DC
  Administered 2020-12-29 – 2020-12-31 (×3): 2 [IU] via SUBCUTANEOUS

## 2020-12-29 MED ORDER — ALBUTEROL SULFATE HFA 108 (90 BASE) MCG/ACT IN AERS
2.0000 | INHALATION_SPRAY | RESPIRATORY_TRACT | Status: DC | PRN
  Filled 2020-12-29: qty 6.7

## 2020-12-29 MED ORDER — ONDANSETRON HCL 4 MG/2ML IJ SOLN: 4.0000 mg | Freq: Four times a day (QID) | INTRAMUSCULAR | Status: DC | PRN

## 2020-12-29 MED ORDER — METHYLPREDNISOLONE SODIUM SUCC 125 MG IJ SOLR: 1.0000 mg/kg | Freq: Once | INTRAMUSCULAR | Status: DC

## 2020-12-29 MED ORDER — GUAIFENESIN-DM 100-10 MG/5ML PO SYRP
10.0000 mL | ORAL_SOLUTION | ORAL | Status: DC | PRN
  Administered 2020-12-29: 10 mL via ORAL
  Filled 2020-12-29: qty 10

## 2020-12-29 MED ORDER — FLEET ENEMA 7-19 GM/118ML RE ENEM: 1.0000 | ENEMA | Freq: Once | RECTAL | Status: DC | PRN

## 2020-12-29 MED ORDER — LORATADINE 10 MG PO TABS
10.0000 mg | ORAL_TABLET | Freq: Every evening | ORAL | Status: DC
  Administered 2020-12-29 – 2021-01-01 (×4): 10 mg via ORAL
  Filled 2020-12-29 (×4): qty 1

## 2020-12-29 MED ORDER — SODIUM CHLORIDE 0.9 % IV SOLN: 250.0000 mL | INTRAVENOUS | Status: DC | PRN

## 2020-12-29 MED ORDER — ACETAMINOPHEN 325 MG PO TABS: 650.0000 mg | ORAL_TABLET | Freq: Four times a day (QID) | ORAL | Status: DC | PRN

## 2020-12-29 MED ORDER — METHYLPREDNISOLONE SODIUM SUCC 125 MG IJ SOLR
0.5000 mg/kg | Freq: Two times a day (BID) | INTRAMUSCULAR | Status: AC
  Administered 2020-12-29 – 2021-01-01 (×6): 54.375 mg via INTRAVENOUS
  Filled 2020-12-29 (×6): qty 2

## 2020-12-29 MED ORDER — ASCORBIC ACID 500 MG PO TABS
500.0000 mg | ORAL_TABLET | Freq: Every day | ORAL | Status: DC
  Administered 2020-12-29 – 2021-01-02 (×5): 500 mg via ORAL
  Filled 2020-12-29 (×5): qty 1

## 2020-12-29 MED ORDER — POTASSIUM CHLORIDE CRYS ER 20 MEQ PO TBCR
40.0000 meq | EXTENDED_RELEASE_TABLET | Freq: Once | ORAL | Status: AC
  Administered 2020-12-29: 40 meq via ORAL
  Filled 2020-12-29: qty 2

## 2020-12-29 MED ORDER — SODIUM CHLORIDE 0.9 % IV SOLN
100.0000 mg | INTRAVENOUS | Status: AC
  Administered 2020-12-30 – 2021-01-02 (×4): 100 mg via INTRAVENOUS
  Filled 2020-12-29 (×5): qty 20

## 2020-12-29 MED ORDER — POLYETHYLENE GLYCOL 3350 17 G PO PACK: 17.0000 g | PACK | Freq: Every day | ORAL | Status: DC | PRN

## 2020-12-29 MED ORDER — ENOXAPARIN SODIUM 40 MG/0.4ML ~~LOC~~ SOLN
40.0000 mg | Freq: Every day | SUBCUTANEOUS | Status: DC
  Administered 2020-12-29 – 2021-01-02 (×5): 40 mg via SUBCUTANEOUS
  Filled 2020-12-29 (×5): qty 0.4

## 2020-12-29 MED ORDER — DOCUSATE SODIUM 100 MG PO CAPS
100.0000 mg | ORAL_CAPSULE | Freq: Two times a day (BID) | ORAL | Status: DC
  Administered 2020-12-29 – 2021-01-02 (×8): 100 mg via ORAL
  Filled 2020-12-29 (×9): qty 1

## 2020-12-29 MED ORDER — AMLODIPINE BESYLATE 10 MG PO TABS
10.0000 mg | ORAL_TABLET | Freq: Every day | ORAL | Status: DC
  Administered 2020-12-29 – 2021-01-02 (×5): 10 mg via ORAL
  Filled 2020-12-29: qty 2
  Filled 2020-12-29 (×2): qty 1
  Filled 2020-12-29: qty 2
  Filled 2020-12-29: qty 1

## 2020-12-29 MED ORDER — METHYLPREDNISOLONE SODIUM SUCC 125 MG IJ SOLR
1.0000 mg/kg | Freq: Two times a day (BID) | INTRAMUSCULAR | Status: DC
  Administered 2020-12-29: 108.75 mg via INTRAVENOUS
  Filled 2020-12-29: qty 2

## 2020-12-29 MED ORDER — LEVOCETIRIZINE DIHYDROCHLORIDE 5 MG PO TABS: 5.0000 mg | ORAL_TABLET | Freq: Every evening | ORAL | Status: DC

## 2020-12-29 MED ORDER — ZINC SULFATE 220 (50 ZN) MG PO CAPS
220.0000 mg | ORAL_CAPSULE | Freq: Every day | ORAL | Status: DC
  Administered 2020-12-29 – 2021-01-02 (×5): 220 mg via ORAL
  Filled 2020-12-29 (×5): qty 1

## 2020-12-29 MED ORDER — ONDANSETRON HCL 4 MG PO TABS: 4.0000 mg | ORAL_TABLET | Freq: Four times a day (QID) | ORAL | Status: DC | PRN

## 2020-12-29 MED ORDER — INSULIN ASPART 100 UNIT/ML ~~LOC~~ SOLN
0.0000 [IU] | Freq: Three times a day (TID) | SUBCUTANEOUS | Status: DC
  Administered 2020-12-29: 11 [IU] via SUBCUTANEOUS
  Administered 2020-12-30: 2 [IU] via SUBCUTANEOUS

## 2020-12-29 MED ORDER — PREDNISONE 5 MG PO TABS
50.0000 mg | ORAL_TABLET | Freq: Every day | ORAL | Status: DC
  Administered 2021-01-02: 50 mg via ORAL
  Filled 2020-12-29: qty 2

## 2020-12-29 MED ORDER — HYDROCOD POLST-CPM POLST ER 10-8 MG/5ML PO SUER: 5.0000 mL | Freq: Two times a day (BID) | ORAL | Status: DC | PRN

## 2020-12-29 MED ORDER — FENOFIBRATE 160 MG PO TABS
160.0000 mg | ORAL_TABLET | Freq: Every day | ORAL | Status: DC
  Administered 2020-12-29 – 2021-01-02 (×5): 160 mg via ORAL
  Filled 2020-12-29 (×6): qty 1

## 2020-12-29 MED ORDER — SODIUM CHLORIDE 0.9% FLUSH
3.0000 mL | Freq: Two times a day (BID) | INTRAVENOUS | Status: DC
  Administered 2020-12-30 – 2021-01-02 (×6): 3 mL via INTRAVENOUS

## 2020-12-29 MED ORDER — OXYCODONE HCL 5 MG PO TABS: 5.0000 mg | ORAL_TABLET | ORAL | Status: DC | PRN

## 2020-12-29 MED ORDER — SODIUM CHLORIDE 0.9 % IV SOLN
200.0000 mg | Freq: Once | INTRAVENOUS | Status: AC
  Administered 2020-12-29: 200 mg via INTRAVENOUS
  Filled 2020-12-29: qty 40

## 2020-12-29 NOTE — H&P (Signed)
History and Physical    Kevin Reid DGL:[id number] DOB: Apr 13, 1961 DOA: 12/28/2020  PCP: Kevin Late, MD Consultants:  Kevin Reid- cardiology Patient coming from:  Home - lives with wife (who is also in the ER and also being admitted with COVID); NOK: Husband, Kevin Reid, [phone]; Kevin Reid, [phone]; Niece, Kevin Reid, [phone]  Chief Complaint: Worsening COVID symptoms  HPI: Kevin Reid is a 60 y.o. male with medical history significant of OSA on CPAP; HTN; DM; obesity; and HLD presenting with worsening COVID symptoms.  He reports that his symptoms began on 12/27 with URI symptoms.  His wife became symptomatic the following day and they both tested the next day and were informed of the + on 1/1.    He has had progressive SOB and was hypoxic down to 88% at home and so their niece suggested that  both he and his wife come in for evaluation - and now they are both being admitted.  He is unvaccinated.  No GI symptoms.  +fever.   ED Course:  COVID, worsening.  Given Solumedrol, Remdesivir.  On 3L [place] O2.  Review of Systems: As per HPI; otherwise review of systems reviewed and negative.   Ambulatory Status:  Ambulates without assistance  COVID Vaccine Status:  None  Past Medical History:  Diagnosis Date  . Allergy   . Depression   . Diabetes mellitus without complication ([place])   . GERD (gastroesophageal reflux disease)   . Heart murmur   . Hyperlipidemia   . Hypertension   . Joint pain   . Sleep apnea     Past Surgical History:  Procedure Laterality Date  . carpel tunnel    . COLONOSCOPY WITH PROPOFOL N/A 06/21/2015   Procedure: COLONOSCOPY WITH PROPOFOL;  Surgeon: Lollie Sails, MD;  Location: [facility] ENDOSCOPY;  Service: Endoscopy;  Laterality: N/A;  . COLONOSCOPY WITH PROPOFOL N/A 07/20/2015   Procedure: COLONOSCOPY WITH PROPOFOL;  Surgeon: Lollie Sails, MD;  Location: [facility] ENDOSCOPY;  Service: Endoscopy;  Laterality: N/A;   . WISDOM TOOTH EXTRACTION      Social History   Socioeconomic History  . Marital status: Married    Spouse name: Kevin Reid  . Number of children: Not on file  . Years of education: high school  . Highest education level: Not on file  Occupational History  . Occupation: Lead Scientist, water quality  Tobacco Use  . Smoking status: Former Smoker    Packs/day: 1.00    Years: 8.00    Pack years: 8.00    Types: Cigarettes    Quit date: 04/24/2018    Years since quitting: 2.6  . Smokeless tobacco: Former Systems developer    Types: [place] date: 11/19/2013  Vaping Use  . Vaping Use: Never used  Substance and Sexual Activity  . Alcohol use: Yes    Comment: not regularly currently  . Drug use: No  . Sexual activity: Yes    Birth control/protection: None  Other Topics Concern  . Not on file  Social History Narrative   Married to Kevin Reid   Enjoys - shooting for fun, does not hunt as much anymore   Exercise- nothing regularly, needs to exercise   Diet - trying to eat healthy, follow the diabetic diet (avoid carbs)   Support system: wife   Social Determinants of Radio broadcast assistant Strain: Not on file  Food Insecurity: No Food Insecurity  . Worried About Charity fundraiser in the Last Year: Never true  .  Ran Out of Food in the Last Year: Never true  Transportation Needs: No Transportation Needs  . Lack of Transportation (Medical): No  . Lack of Transportation (Non-Medical): No  Physical Activity: Not on file  Stress: Not on file  Social Connections: Not on file  Intimate Partner Violence: Not on file    Allergies  Allergen Reactions  . Simvastatin Other (See Comments)    Chest heaviness and numbness on and off.    Family History  Problem Relation Age of Onset  . Prostate cancer Father 5  . Hyperlipidemia Father   . Cancer Maternal Grandmother   . Heart attack Maternal Grandfather   . Stroke Paternal 53        old  . Stomach cancer Paternal Grandfather      Prior to Admission medications   Medication Sig Start Date End Date Taking? Authorizing Provider  amLODipine (NORVASC) 10 MG tablet Take 1 tablet (10 mg total) by mouth daily. 06/29/20   Lesleigh Noe, MD  aspirin EC 81 MG tablet Take 81 mg by mouth daily.    [provider]  bisoprolol-hydrochlorothiazide (ZIAC) 5-6.25 MG tablet TAKE TWO TABLETS EVERY DAY Patient taking differently: 1 tablet. TAKE ONE TABLETS EVERY DAY 07/07/20   Lesleigh Noe, MD  Blood Glucose Monitoring Suppl (BLOOD GLUCOSE MONITOR SYSTEM) w/Device KIT Use to test blood sugar once daily 04/06/20   Laqueta Linden, MD  fenofibrate (TRICOR) 145 MG tablet Take 1 tablet (145 mg total) by mouth daily. 01/26/20   Lesleigh Noe, MD  glucose blood (CONTOUR NEXT TEST) test strip Check sugar once daily. DX E11.9 02/05/20   Lesleigh Noe, MD  Lancets MISC 1 each by Does not apply route daily as needed. 12/09/19   Lesleigh Noe, MD  levocetirizine (XYZAL) 5 MG tablet Take 5 mg by mouth every evening.    [provider]  lisinopril (ZESTRIL) 30 MG tablet Take 1 tablet (30 mg total) by mouth daily. Patient not taking: No sig reported 06/01/20   Lesleigh Noe, MD  metFORMIN (GLUCOPHAGE) 1000 MG tablet Take 1 tablet (1,000 mg total) by mouth 2 (two) times daily. 11/29/20   Lesleigh Noe, MD  NON FORMULARY CPAP MACHINE USING EVERY NIGHT setting at 10    [provider]  PARoxetine (PAXIL) 20 MG tablet TAKE 3 TABLETS BY MOUTH ONCE DAILY 07/08/20   Lesleigh Noe, MD  Semaglutide,0.25 or 0.5MG/DOS, (OZEMPIC, 0.25 OR 0.5 MG/DOSE,) 2 MG/1.5ML SOPN INJECT 0.5 MG INTO THE SKIN ONCE A WEEK 06/07/20   Lesleigh Noe, MD  Vitamin D, Ergocalciferol, (DRISDOL) 1.25 MG (50000 UNIT) CAPS capsule Take 1 capsule (50,000 Units total) by mouth every 7 (seven) days. 04/06/20   Laqueta Linden, MD    Physical Exam: Vitals:   12/29/20 1032 12/29/20 1100 12/29/20 1130 12/29/20 1156  BP:  132/77 137/80   Pulse:   (!) 59 63   Resp:  (!) 25 (!) 21   Temp:    98.4 F (36.9 C)  TempSrc:    Oral  SpO2:  96% 95%   Weight: 108.9 kg     Height: '5\' 10"'  (1.778 m)        . General:  Appears calm and comfortable and is in NAD . Eyes:  PERRL, EOMI, normal lids, iris . ENT:  grossly normal hearing, lips & tongue, mmm . Neck:  no LAD, masses or thyromegaly . Cardiovascular:  RRR, no m/r/g. No LE edema.  Marland Kitchen  Respiratory:   Scattered rhonchi.  Normal respiratory effort. . Abdomen:  soft, NT, ND, NABS . Back:   normal alignment, no CVAT . Skin:  no rash or induration seen on limited exam . Musculoskeletal:  grossly normal tone BUE/BLE, good ROM, no bony abnormality . Psychiatric:  grossly normal mood and affect, speech fluent and appropriate, AOx3 . Neurologic:  CN 2-12 grossly intact, moves all extremities in coordinated fashion    Radiological Exams on Admission: Independently reviewed - see discussion in A/P where applicable  DG Chest Portable 1 View  Result Date: 12/28/2020 CLINICAL DATA:  COVID cough short of breath EXAM: PORTABLE CHEST 1 VIEW COMPARISON:  07/07/2018 FINDINGS: Subtle ground-glass opacities in the left lower lung and in the right thorax. No pleural effusion. Normal heart size. No pneumothorax IMPRESSION: Suspicion of subtle ground-glass opacities/pneumonia in the left lower lung and right thorax. Electronically Signed   By: Donavan Foil M.D.   On: 12/28/2020 22:17    EKG: Independently reviewed.  NSR with rate 65; nonspecific ST changes with no evidence of acute ischemia   Labs on Admission: I have personally reviewed the available labs and imaging studies at the time of the admission.  Pertinent labs:   K+ 3.0 Glucose 167 BUN 27/Creatinine 1.22/GFR >60 Albumin 3.2 LDH 171 Ferritin 529 CRP 14.3 Lactate 1.2 Procalcitonin <0.10 WBC 5.4 Platelets 130 D-dimer 0.64 Fibrinogen 562 COVID POSITIVE Blood culture pending   Assessment/Plan Principal Problem:   Acute hypoxemic  respiratory failure due to COVID-19 [facility]) Active Problems:   Diabetes mellitus, type 2 ([place])   Hyperlipemia   Hypertension   Sleep apnea     Acute respiratory failure with hypoxia due to COVID-19 PNA -Patient with presenting with SOB, fever and cough in the setting of known COVID infection -No significant GI symptoms -He does not have a usual home O2 requirement and is currently requiring 3L [place] O2 based on O2 sat of 90% and mild tachypnea -COVID POSITIVE -The patient has comorbidities which may increase the risk for ARDS/MODS including:  HTN, obesity, DM -Pertinent labs concerning for COVID include lymphopenia; increased BUN; elevated D-dimer (but not >1); increased ferritin; low procalcitonin; markedly elevated CRP (>7); increased fibrinogen -CXR with multifocal opacities which may be c/w COVID vs. Multifocal PNA -Will not treat with broad-spectrum antibiotics given procalcitonin <0.5 -Will admit for further evaluation, close monitoring, and treatment -Monitor on telemetry x at least 24 hours -At this time, will attempt to avoid use of aerosolized medications and use HFAs instead -Will check daily labs including BMP with Mag, Phos; LFTs; CBC with differential; CRP; ferritin; fibrinogen; D-dimer -Will order steroids and Remdesivir (pharmacy consult) given +COVID test, +CXR, and hypoxia <94% on room air -If the patient shows clinical deterioration, consider transfer to ICU with PCCM consultation -Consider IL-6 agonist (Actemra) and/or JAK inhibitor (baricitinib) if the patient does not stabilize on current treatment or if the patient has marked clinical decompensation; the patient does not appear to require this treatment at this time but has been consented and agrees to receive treatment if there is evidence of clinical worsening despite current treatments.  -The patient is aware that this medication is being used off-label and agrees to use. -Will attempt to maintain euvolemia to a net  negative fluid status -Will ask the patient to maintain an awake prone position for as much time as possible daily -Patient was seen wearing full PPE including: gown, gloves, head cover, N95, and face shield; donning and doffing was in compliance  with current standards.  DM -May utilize CGM if desired -Will check A1c -hold Glucophage, Ozempic -Cover with moderate-scale SSI  HTN -Continue home meds - Norvasc, Bisoprolol, Lisinopril -He was hypokalemic on presentation (K+ 3.0), likely due to the HCTZ in conjunction with decreased PO intake in the setting of COVID; this has been repleted but will hold HCTZ for now -Will cover with prn IV hydralazine  HLD -Continue Tricor  OSA -Hold CPAP for now due to increased aerosolization in the setting of COVID-19 infection  Depression -Continue Paxil  Obesity -BMI 34.44 -Weight loss should be encouraged -Outpatient PCP/bariatric medicine f/u encouraged     DVT prophylaxis:  Lovenox  Code Status:  Full - confirmed with patient Family Communication: None present; I spoke with the patient's sister-in-law by telephone and also admitted his wife and updated her Disposition Plan:  The patient is from: home  Anticipated d/c is to: home without [facility] services once his respiratory issues have been resolved.  He may require home O2 at the time of discharge.  Anticipated d/c date will depend on clinical response to treatment, likely between 3 days (with completion of outpatient Remdesivir treatment) and 5 days  Patient is currently: acutely ill Consults called: None  Admission status: Admit - It is my clinical opinion that admission to INPATIENT is reasonable and necessary because of the expectation that this patient will require [facility] care that crosses at least 2 midnights to treat this condition based on the medical complexity of the problems presented.  Given the aforementioned information, the predictability of an adverse outcome is felt to be  significant.    Karmen Bongo MD [facility]   How to contact the [facility] Attending or Consulting provider [place] or covering provider during after hours [place], for this patient?  1. Check the care team in [facility] and look for a) attending/consulting TRH provider listed and b) the [facility] team listed 2. Log into [email or web address] and use [place]'s universal password to access. If you do not have the password, please contact the [facility] operator. 3. Locate the [facility] [place] provider you are looking for under [facility] and page to a number that you can be directly reached. 4. If you still have difficulty reaching the provider, please page the [facility] (Director on Call) for the Hospitalists listed on amion for assistance.   12/29/2020, 12:11 PM

## 2020-12-29 NOTE — ED Notes (Signed)
Meal ordered

## 2020-12-29 NOTE — ED Provider Notes (Signed)
[place] EMERGENCY DEPARTMENT Provider Note  CSN: [id number] Arrival date & time: 12/28/20 2117    History Chief Complaint  Patient presents with  . Cough    HPI  Kevin Reid is a 60 y.o. male here with about a week of increasing cough, fever and SOB. Tested positive for Covid based on swab done 12/29. Symptoms started 12/28. He has been watching SpO2 at home and has seen it as low as 88%. He denies prior breathing problems. Is not vaccinated.    Past Medical History:  Diagnosis Date  . Allergy   . Depression   . Diabetes mellitus without complication ([place])   . GERD (gastroesophageal reflux disease)   . Heart murmur   . Hyperlipidemia   . Hypertension   . Joint pain   . Sleep apnea     Past Surgical History:  Procedure Laterality Date  . carpel tunnel    . COLONOSCOPY WITH PROPOFOL N/A 06/21/2015   Procedure: COLONOSCOPY WITH PROPOFOL;  Surgeon: Lollie Sails, MD;  Location: [facility] ENDOSCOPY;  Service: Endoscopy;  Laterality: N/A;  . COLONOSCOPY WITH PROPOFOL N/A 07/20/2015   Procedure: COLONOSCOPY WITH PROPOFOL;  Surgeon: Lollie Sails, MD;  Location: [facility] ENDOSCOPY;  Service: Endoscopy;  Laterality: N/A;  . WISDOM TOOTH EXTRACTION      Family History  Problem Relation Age of Onset  . Prostate cancer Father 74  . Hyperlipidemia Father   . Cancer Maternal Grandmother   . Heart attack Maternal Grandfather   . Stroke Paternal 9        old  . Stomach cancer Paternal Grandfather     Social History   Tobacco Use  . Smoking status: Former Smoker    Packs/day: 1.00    Years: 8.00    Pack years: 8.00    Types: Cigarettes    Quit date: 04/24/2018    Years since quitting: 2.6  . Smokeless tobacco: Former Systems developer    Types: [place] date: 11/19/2013  Vaping Use  . Vaping Use: Never used  Substance Use Topics  . Alcohol use: Yes    Comment: not regularly currently  . Drug use: No     Home Medications Prior to Admission medications    Medication Sig Start Date End Date Taking? Authorizing Provider  amLODipine (NORVASC) 10 MG tablet Take 1 tablet (10 mg total) by mouth daily. 06/29/20  Yes Lesleigh Noe, MD  aspirin EC 81 MG tablet Take 81 mg by mouth daily.   Yes [provider]  bisoprolol-hydrochlorothiazide (ZIAC) 5-6.25 MG tablet TAKE TWO TABLETS EVERY DAY Patient taking differently: Take 1 tablet by mouth daily. 07/07/20  Yes Lesleigh Noe, MD  Blood Glucose Monitoring Suppl (BLOOD GLUCOSE MONITOR SYSTEM) w/Device KIT Use to test blood sugar once daily 04/06/20  Yes Laqueta Linden, MD  fenofibrate (TRICOR) 145 MG tablet Take 1 tablet (145 mg total) by mouth daily. 01/26/20  Yes Lesleigh Noe, MD  glucose blood (CONTOUR NEXT TEST) test strip Check sugar once daily. DX E11.9 02/05/20  Yes Lesleigh Noe, MD  Lancets MISC 1 each by Does not apply route daily as needed. 12/09/19  Yes Lesleigh Noe, MD  levocetirizine (XYZAL) 5 MG tablet Take 5 mg by mouth every evening.   Yes [provider]  metFORMIN (GLUCOPHAGE) 1000 MG tablet Take 1 tablet (1,000 mg total) by mouth 2 (two) times daily. 11/29/20  Yes Lesleigh Noe, MD  NON FORMULARY CPAP MACHINE  USING EVERY NIGHT setting at 10   Yes [provider]  PARoxetine (PAXIL) 20 MG tablet TAKE 3 TABLETS BY MOUTH ONCE DAILY Patient taking differently: Take 60 mg by mouth daily. 07/08/20  Yes Lesleigh Noe, MD  Semaglutide,0.25 or 0.5MG/DOS, (OZEMPIC, 0.25 OR 0.5 MG/DOSE,) 2 MG/1.5ML SOPN INJECT 0.5 MG INTO THE SKIN ONCE A WEEK Patient taking differently: Inject 0.25 mg into the skin once a week. INJECT 0.5 MG INTO THE SKIN ONCE A WEEK 06/07/20  Yes Lesleigh Noe, MD  Vitamin D, Ergocalciferol, (DRISDOL) 1.25 MG (50000 UNIT) CAPS capsule Take 1 capsule (50,000 Units total) by mouth every 7 (seven) days. 04/06/20  Yes Laqueta Linden, MD     Allergies    Codeine and Simvastatin   Review of Systems   Review of Systems A comprehensive  review of systems was completed and negative except as noted in HPI.    Physical Exam BP 137/80   Pulse 63   Temp 98.4 F (36.9 C) (Oral)   Resp (!) 21   Ht '5\' 10"'  (1.778 m)   Wt 108.9 kg   SpO2 95%   BMI 34.44 kg/m   Physical Exam Vitals and nursing note reviewed.  Constitutional:      Appearance: Normal appearance.  HENT:     Head: Normocephalic and atraumatic.     Nose: Nose normal.     Mouth/Throat:     Mouth: Mucous membranes are moist.  Eyes:     Extraocular Movements: Extraocular movements intact.     Conjunctiva/sclera: Conjunctivae normal.  Cardiovascular:     Rate and Rhythm: Normal rate.  Pulmonary:     Effort: Pulmonary effort is normal.     Breath sounds: Normal breath sounds.     Comments: Tachypnea, rhonchi  Abdominal:     General: Abdomen is flat.     Palpations: Abdomen is soft.     Tenderness: There is no abdominal tenderness.  Musculoskeletal:        General: No swelling. Normal range of motion.     Cervical back: Neck supple.  Skin:    General: Skin is warm and dry.  Neurological:     General: No focal deficit present.     Mental Status: He is alert.  Psychiatric:        Mood and Affect: Mood normal.      ED Results / Procedures / Treatments   Labs (all labs ordered are listed, but only abnormal results are displayed) Labs Reviewed  RESP PANEL BY RT-PCR (FLU A&B, COVID) ARPGX2 - Abnormal; Notable for the following components:      Result Value   SARS Coronavirus 2 by RT PCR POSITIVE (*)    All other components within normal limits  CBC WITH DIFFERENTIAL/PLATELET - Abnormal; Notable for the following components:   RBC 4.14 (*)    HCT 36.0 (*)    MCHC 36.1 (*)    Platelets 130 (*)    Lymphs Abs 0.6 (*)    All other components within normal limits  COMPREHENSIVE METABOLIC PANEL - Abnormal; Notable for the following components:   Potassium 3.0 (*)    Glucose, Bld 167 (*)    BUN 27 (*)    Calcium 8.5 (*)    Total Protein 6.3 (*)     Albumin 3.2 (*)    Alkaline Phosphatase 31 (*)    All other components within normal limits  D-DIMER, QUANTITATIVE (NOT AT [facility]) - Abnormal; Notable for the following  components:   D-Dimer, Quant 0.64 (*)    All other components within normal limits  FERRITIN - Abnormal; Notable for the following components:   Ferritin 529 (*)    All other components within normal limits  FIBRINOGEN - Abnormal; Notable for the following components:   Fibrinogen 562 (*)    All other components within normal limits  C-REACTIVE PROTEIN - Abnormal; Notable for the following components:   CRP 14.3 (*)    All other components within normal limits  TRIGLYCERIDES - Abnormal; Notable for the following components:   Triglycerides 209 (*)    All other components within normal limits  HEMOGLOBIN A1C - Abnormal; Notable for the following components:   Hgb A1c MFr Bld 7.2 (*)    All other components within normal limits  CULTURE, BLOOD (ROUTINE X 2)  CULTURE, BLOOD (ROUTINE X 2)  LACTIC ACID, PLASMA  PROCALCITONIN  LACTATE DEHYDROGENASE    EKG EKG Interpretation  Date/Time:  Wednesday December 29 2020 09:33:36 EST Ventricular Rate:  65 PR Interval:    QRS Duration: 109 QT Interval:  455 QTC Calculation: 474 R Axis:   -25 Text Interpretation: Sinus rhythm Borderline left axis deviation Abnormal R-wave progression, late transition Borderline repolarization abnormality Baseline wander in lead(s) II III aVF V1 V2 V4 V5 No significant change since last tracing Confirmed by Calvert Cantor [phone]) on 12/29/2020 9:52:35 AM   Radiology DG Chest Portable 1 View  Result Date: 12/28/2020 CLINICAL DATA:  COVID cough short of breath EXAM: PORTABLE CHEST 1 VIEW COMPARISON:  07/07/2018 FINDINGS: Subtle ground-glass opacities in the left lower lung and in the right thorax. No pleural effusion. Normal heart size. No pneumothorax IMPRESSION: Suspicion of subtle ground-glass opacities/pneumonia in the left lower lung and  right thorax. Electronically Signed   By: Donavan Foil M.D.   On: 12/28/2020 22:17    Procedures .Critical Care Performed by: Truddie Hidden, MD Authorized by: Truddie Hidden, MD   Critical care provider statement:    Critical care time (minutes):  45   Critical care time was exclusive of:  Separately billable procedures and treating other patients   Critical care was necessary to treat or prevent imminent or life-threatening deterioration of the following conditions:  Respiratory failure   Critical care was time spent personally by me on the following activities:  Discussions with consultants, evaluation of patient's response to treatment, examination of patient, ordering and performing treatments and interventions, ordering and review of laboratory studies, ordering and review of radiographic studies, pulse oximetry, re-evaluation of patient's condition, obtaining history from patient or surrogate and review of old charts    Medications Ordered in the ED Medications  remdesivir 200 mg in sodium chloride 0.9% 250 mL IVPB (has no administration in time range)    Followed by  remdesivir 100 mg in sodium chloride 0.9 % 100 mL IVPB (has no administration in time range)  aspirin EC tablet 81 mg (has no administration in time range)  amLODipine (NORVASC) tablet 10 mg (has no administration in time range)  bisoprolol (ZEBETA) tablet 5 mg (has no administration in time range)  fenofibrate tablet 160 mg (has no administration in time range)  lisinopril (ZESTRIL) tablet 30 mg (has no administration in time range)  PARoxetine (PAXIL) tablet 60 mg (has no administration in time range)  levocetirizine (XYZAL) tablet 5 mg (has no administration in time range)  insulin aspart (novoLOG) injection 0-15 Units (has no administration in time range)  enoxaparin (LOVENOX) injection 40 mg (  has no administration in time range)  sodium chloride flush (NS) 0.9 % injection 3 mL (has no administration in  time range)  sodium chloride flush (NS) 0.9 % injection 3 mL (has no administration in time range)  sodium chloride flush (NS) 0.9 % injection 3 mL (has no administration in time range)  0.9 %  sodium chloride infusion (has no administration in time range)  albuterol (VENTOLIN HFA) 108 (90 Base) MCG/ACT inhaler 2 puff (has no administration in time range)  methylPREDNISolone sodium succinate (SOLU-MEDROL) 125 mg/2 mL injection 54.375 mg (has no administration in time range)    Followed by  predniSONE (DELTASONE) tablet 50 mg (has no administration in time range)  guaiFENesin-dextromethorphan (ROBITUSSIN DM) 100-10 MG/5ML syrup 10 mL (has no administration in time range)  chlorpheniramine-HYDROcodone (TUSSIONEX) 10-8 MG/5ML suspension 5 mL (has no administration in time range)  ascorbic acid (VITAMIN C) tablet 500 mg (has no administration in time range)  zinc sulfate capsule 220 mg (has no administration in time range)  acetaminophen (TYLENOL) tablet 650 mg (has no administration in time range)  oxyCODONE (Oxy IR/ROXICODONE) immediate release tablet 5 mg (has no administration in time range)  docusate sodium (COLACE) capsule 100 mg (has no administration in time range)  polyethylene glycol (MIRALAX / GLYCOLAX) packet 17 g (has no administration in time range)  bisacodyl (DULCOLAX) EC tablet 5 mg (has no administration in time range)  sodium phosphate (FLEET) 7-19 GM/118ML enema 1 enema (has no administration in time range)  ondansetron (ZOFRAN) tablet 4 mg (has no administration in time range)    Or  ondansetron (ZOFRAN) injection 4 mg (has no administration in time range)  insulin aspart (novoLOG) injection 0-5 Units (has no administration in time range)  potassium chloride SA (KLOR-CON) CR tablet 40 mEq (has no administration in time range)     MDM Rules/Calculators/A&P MDM Patient noted to be 88-89% on room air at rest on my initial evaluation. Will add Covid admission labs to his CXR  done from triage overnight. Begin supplemental O2 and anticipate admission.  ED Course  I have reviewed the triage vital signs and the nursing notes.  Pertinent labs & imaging results that were available during my care of the patient were reviewed by me and considered in my medical decision making (see chart for details).  Clinical Course as of 12/29/20 1332  Wed Dec 29, 2020  2353 I was unable to confirm Covid positive test from [facility] in [place]. Will reswab here.  [CS]  6144 CBC without significant findings.  [CS]  1025 Lactic acid is normal.  [CS]  3154 Patient now on 3L [place], SpO2 mid to low 90s.  [CS]  0086 PYP with mild hyperglycemia and mild hyponatremia. Inflammatory markers elevated, consistent with suspected Covid.  [CS]  9509 Covid confirmed positive. Will begin remdesivir, admit for further management. [CS]  1122 Spoke with Dr. Lorin Mercy, Hospitalist, who will evaluate for admission.  [CS]    Clinical Course User Index [CS] Truddie Hidden, MD    Final Clinical Impression(s) / ED Diagnoses Final diagnoses:  COVID-19  Acute respiratory failure with hypoxia [facility])    Rx / DC Orders ED Discharge Orders    None       Truddie Hidden, MD 12/29/20 1332

## 2020-12-30 DIAGNOSIS — J9601 Acute respiratory failure with hypoxia: Secondary | ICD-10-CM

## 2020-12-30 DIAGNOSIS — J1282 Pneumonia due to coronavirus disease 2019: Secondary | ICD-10-CM | POA: Diagnosis not present

## 2020-12-30 DIAGNOSIS — U071 COVID-19: Secondary | ICD-10-CM

## 2020-12-30 DIAGNOSIS — E119 Type 2 diabetes mellitus without complications: Secondary | ICD-10-CM | POA: Diagnosis not present

## 2020-12-30 LAB — COMPREHENSIVE METABOLIC PANEL
ALT: 20 U/L (ref 0–44)
AST: 19 U/L (ref 15–41)
Albumin: 3.1 g/dL — ABNORMAL LOW (ref 3.5–5.0)
Alkaline Phosphatase: 32 U/L — ABNORMAL LOW (ref 38–126)
Anion gap: 14 (ref 5–15)
BUN: 26 mg/dL — ABNORMAL HIGH (ref 6–20)
CO2: 24 mmol/L (ref 22–32)
Calcium: 8.9 mg/dL (ref 8.9–10.3)
Chloride: 99 mmol/L (ref 98–111)
Creatinine, Ser: 1 mg/dL (ref 0.61–1.24)
GFR, Estimated: >60 mL/min (ref >60)
Glucose, Bld: 228 mg/dL — ABNORMAL HIGH (ref 70–99)
Potassium: 3.7 mmol/L (ref 3.5–5.1)
Sodium: 137 mmol/L (ref 135–145)
Total Bilirubin: 0.6 mg/dL (ref 0.3–1.2)
Total Protein: 6.5 g/dL (ref 6.5–8.1)

## 2020-12-30 LAB — CBC WITH DIFFERENTIAL/PLATELET
Abs Immature Granulocytes: 0.03 10*3/uL (ref 0.00–0.07)
Basophils Absolute: 0 10*3/uL (ref 0.0–0.1)
Basophils Relative: 0 %
Eosinophils Absolute: 0 10*3/uL (ref 0.0–0.5)
Eosinophils Relative: 0 %
HCT: 37.2 % — ABNORMAL LOW (ref 39.0–52.0)
Hemoglobin: 13.2 g/dL (ref 13.0–17.0)
Immature Granulocytes: 1 %
Lymphocytes Relative: 9 %
Lymphs Abs: 0.5 10*3/uL — ABNORMAL LOW (ref 0.7–4.0)
MCH: 31.4 pg (ref 26.0–34.0)
MCHC: 35.5 g/dL (ref 30.0–36.0)
MCV: 88.4 fL (ref 80.0–100.0)
Monocytes Absolute: 0.3 10*3/uL (ref 0.1–1.0)
Monocytes Relative: 5 %
Neutro Abs: 5 10*3/uL (ref 1.7–7.7)
Neutrophils Relative %: 85 %
Platelets: 158 10*3/uL (ref 150–400)
RBC: 4.21 MIL/uL — ABNORMAL LOW (ref 4.22–5.81)
RDW: 12.2 % (ref 11.5–15.5)
WBC: 5.9 10*3/uL (ref 4.0–10.5)
nRBC: 0 % (ref 0.0–0.2)

## 2020-12-30 LAB — PHOSPHORUS: Phosphorus: 3.2 mg/dL (ref 2.5–4.6)

## 2020-12-30 LAB — HIV ANTIBODY (ROUTINE TESTING W REFLEX): HIV Screen 4th Generation wRfx: NONREACTIVE

## 2020-12-30 LAB — FERRITIN: Ferritin: 692 ng/mL — ABNORMAL HIGH (ref 24–336)

## 2020-12-30 LAB — C-REACTIVE PROTEIN: CRP: 16.7 mg/dL — ABNORMAL HIGH (ref <1.0)

## 2020-12-30 LAB — D-DIMER, QUANTITATIVE: D-Dimer, Quant: 0.87 ug/mL-FEU — ABNORMAL HIGH (ref 0.00–0.50)

## 2020-12-30 LAB — CBG MONITORING, ED
Glucose-Capillary: 236 mg/dL — ABNORMAL HIGH (ref 70–99)
Glucose-Capillary: 169 mg/dL — ABNORMAL HIGH (ref 70–99)
Glucose-Capillary: 251 mg/dL — ABNORMAL HIGH (ref 70–99)
Glucose-Capillary: 222 mg/dL — ABNORMAL HIGH (ref 70–99)

## 2020-12-30 LAB — MAGNESIUM: Magnesium: 2.2 mg/dL (ref 1.7–2.4)

## 2020-12-30 MED ORDER — INSULIN GLARGINE 100 UNIT/ML ~~LOC~~ SOLN
5.0000 [IU] | Freq: Every day | SUBCUTANEOUS | Status: AC
  Administered 2020-12-30 – 2020-12-31 (×2): 5 [IU] via SUBCUTANEOUS
  Filled 2020-12-30 (×2): qty 0.05

## 2020-12-30 MED ORDER — INSULIN ASPART 100 UNIT/ML ~~LOC~~ SOLN
0.0000 [IU] | Freq: Three times a day (TID) | SUBCUTANEOUS | Status: DC
  Administered 2020-12-30: 11 [IU] via SUBCUTANEOUS
  Administered 2020-12-31 (×3): 7 [IU] via SUBCUTANEOUS
  Administered 2021-01-01: 11 [IU] via SUBCUTANEOUS
  Administered 2021-01-01: 7 [IU] via SUBCUTANEOUS
  Administered 2021-01-01: 11 [IU] via SUBCUTANEOUS
  Administered 2021-01-02: 7 [IU] via SUBCUTANEOUS

## 2020-12-30 NOTE — ED Notes (Signed)
Lunch Tray Ordered @ 1114.  

## 2020-12-30 NOTE — Progress Notes (Signed)
PROGRESS NOTE  Kevin Reid YHC:[id number] DOB: [birth year] DOA: 12/28/2020 PCP: Kandyce Rud, MD  Brief History   60 year old man PMH includes diabetes mellitus type 2, diagnosed with Covid 12/29 as an outpatient, reported hypoxia at home, came in for shortness of breath.  Admitted for acute hypoxic respiratory failure secondary to Covid pneumonia.  A & P  Acute hypoxic respiratory failure secondary to Covid pneumonia --Appears dyspneic and short of breath but SPO2 in the 90s on 2 L, no signs or symptoms of impending ventilatory failure.  Given stability at this point hold off on immunomodulators. --Continue current treatment as below  . CXR on admit: bilateral patchy infiltrates . Fever: no . Oxygen requirement: 2L [place] . Antibiotics: none . Remdesivir: 1/5  > . Steroids: 1/5 > . Actemra/baricitinib: not indicated currently, but pt consented should he worse, will start . Vitamin C and Zinc . Proning: recommended   Inflammatory markers: . CRP: 14.3 > 16.7  . D-dimer: .64 > .87  . LDH: 171 . Ferritin 529 > 692 . Procalcitonin: < .10  Diabetes mellitus type 2, hemoglobin A1c 7.2 --CBG high, changed to resistant sliding scale, add long-acting insulin while on steroids --Hold Glucophage and Ozempic while hospitalized  Essential hypertension --Stable.  Continue Norvasc, bisoprolol and lisinopril.   Disposition Plan:  Discussion: as above  Status is: Inpatient  Remains inpatient appropriate because:IV treatments appropriate due to intensity of illness or inability to take PO and Inpatient level of care appropriate due to severity of illness   Dispo: The patient is from: Home              Anticipated d/c is to: Home              Anticipated d/c date is: 3 days              Patient currently is not medically stable to d/c.  DVT prophylaxis: enoxaparin (LOVENOX) injection 40 mg Start: 12/29/20 1230   Code Status: Full Code Family Communication: none  Brendia Sacks,  MD  [facility] Direct contact: see [email or web address] (further directions at bottom of note if needed) 7PM-7AM contact night coverage as at bottom of note 12/30/2020, 12:46 PM  LOS: 1 day   Significant Hospital Events   .    Consults:  .    Procedures:  .   Significant Diagnostic Tests:  Marland Kitchen    Micro Data:  .    Antimicrobials:  .   Interval History/Subjective  CC: f/u COVID  Feels short of breath, still coughing.  Gets hypoxic and short of breath with movement.  Objective   Vitals:  Vitals:   12/30/20 1100 12/30/20 1200  BP: (!) 159/98 (!) 157/98  Pulse: (!) 57 (!) 59  Resp:  (!) 22  Temp:    SpO2: 94% 94%    Exam:  Constitutional:   . Appears calm but ill and uncomfortable ENMT:  . grossly normal hearing  . Lips appear normal Respiratory:  . CTA bilaterally, no w/r/r.  . Respiratory effort moderately increased, tachypneic, dyspneic.  Cardiovascular:  . RRR, no m/r/g . No LE extremity edema   Psychiatric:  . Mental status o Mood, affect appropriate  I have personally reviewed the following:   Today's Data  . CBG in the 200s . CMP unremarkable . CBC unremarkable  Scheduled Meds: . amLODipine  10 mg Oral Daily  . vitamin C  500 mg Oral Daily  . aspirin EC  81 mg Oral Daily  .  bisoprolol  5 mg Oral Daily  . docusate sodium  100 mg Oral BID  . enoxaparin (LOVENOX) injection  40 mg Subcutaneous Daily  . fenofibrate  160 mg Oral Daily  . insulin aspart  0-20 Units Subcutaneous TID WC  . insulin aspart  0-5 Units Subcutaneous QHS  . insulin glargine  5 Units Subcutaneous Daily  . loratadine  10 mg Oral QPM  . methylPREDNISolone (SOLU-MEDROL) injection  0.5 mg/kg Intravenous Q12H   Followed by  . [START ON 01/02/2021] predniSONE  50 mg Oral Daily  . PARoxetine  60 mg Oral Daily  . sodium chloride flush  3 mL Intravenous Q12H  . sodium chloride flush  3 mL Intravenous Q12H  . zinc sulfate  220 mg Oral Daily   Continuous Infusions: . sodium  chloride    . remdesivir 100 mg in NS 100 mL 100 mg (12/30/20 1202)    Principal Problem:   Acute hypoxemic respiratory failure due to COVID-19 [facility]) Active Problems:   Diabetes mellitus, type 2 (HCC)   Hyperlipemia   Hypertension   Sleep apnea   LOS: 1 day   How to contact the [facility] Attending or Consulting provider 7A - 7P or covering provider during after hours 7P -7A, for this patient?  1. Check the care team in [facility] and look for a) attending/consulting TR[facility] provider listed and b) the [facility] team listed 2. Log into [email or web address] and use [place]'s universal password to access. If you do not have the password, please contact the hospital operator. 3. Locate the [facility] provider you are looking for under [facility] and page to a number that you can be directly reached. 4. If you still have difficulty reaching the provider, please page the [facility] (Director on Call) for the Hospitalists listed on amion for assistance.

## 2020-12-30 NOTE — Hospital Course (Addendum)
59 year old man PMH includes diabetes mellitus type 2, diagnosed with Covid 12/29 as an outpatient, reported hypoxia at home, came in for shortness of breath.  Admitted for acute hypoxic respiratory failure secondary to Covid pneumonia.  A & P  Acute hypoxic respiratory failure secondary to Covid pneumonia --Appears a bit better today, oxygen requirement stable on 2 L, inflammatory markers trending down, overall appears to be improving..  CXR on admit: bilateral patchy infiltrates Fever: no Oxygen requirement: 2L Kevin Reid Antibiotics: none Remdesivir: 1/5  > Steroids: 1/5 > Actemra/baricitinib: not indicated currently, but pt consented should he worsen, would start Vitamin C and Zinc Proning: recommended as tolerated   Inflammatory markers: CRP: 14.3 > 16.7  > 9.1 > 3.9 D-dimer: .64 > .87 > .66 > .37  Ferritin 529 > 692 > 882 > 821 Procalcitonin: < .10  Diabetes mellitus type 2, hemoglobin A1c 7.2 --CBG remains high.  Lantus just increased.  Continue meal coverage.  --Hold Glucophage and Ozempic while hospitalized  Essential hypertension --Stable, continue Norvasc, bisoprolol and lisinopril.

## 2020-12-30 NOTE — ED Notes (Signed)
Pt given meal tray.

## 2020-12-31 ENCOUNTER — Encounter (HOSPITAL_COMMUNITY): Payer: Self-pay | Admitting: Internal Medicine

## 2020-12-31 DIAGNOSIS — E1165 Type 2 diabetes mellitus with hyperglycemia: Secondary | ICD-10-CM | POA: Diagnosis not present

## 2020-12-31 DIAGNOSIS — U071 COVID-19: Secondary | ICD-10-CM | POA: Diagnosis not present

## 2020-12-31 DIAGNOSIS — J1282 Pneumonia due to coronavirus disease 2019: Secondary | ICD-10-CM | POA: Diagnosis not present

## 2020-12-31 DIAGNOSIS — J9601 Acute respiratory failure with hypoxia: Secondary | ICD-10-CM | POA: Diagnosis not present

## 2020-12-31 LAB — CBC WITH DIFFERENTIAL/PLATELET
Abs Immature Granulocytes: 0.05 10*3/uL (ref 0.00–0.07)
Basophils Absolute: 0 10*3/uL (ref 0.0–0.1)
Basophils Relative: 0 %
Eosinophils Absolute: 0 10*3/uL (ref 0.0–0.5)
Eosinophils Relative: 0 %
HCT: 39.9 % (ref 39.0–52.0)
Hemoglobin: 13.7 g/dL (ref 13.0–17.0)
Immature Granulocytes: 1 %
Lymphocytes Relative: 6 %
Lymphs Abs: 0.5 10*3/uL — ABNORMAL LOW (ref 0.7–4.0)
MCH: 30.4 pg (ref 26.0–34.0)
MCHC: 34.3 g/dL (ref 30.0–36.0)
MCV: 88.5 fL (ref 80.0–100.0)
Monocytes Absolute: 0.4 10*3/uL (ref 0.1–1.0)
Monocytes Relative: 5 %
Neutro Abs: 6.9 10*3/uL (ref 1.7–7.7)
Neutrophils Relative %: 88 %
Platelets: 199 10*3/uL (ref 150–400)
RBC: 4.51 MIL/uL (ref 4.22–5.81)
RDW: 11.9 % (ref 11.5–15.5)
WBC: 7.8 10*3/uL (ref 4.0–10.5)
nRBC: 0 % (ref 0.0–0.2)

## 2020-12-31 LAB — GLUCOSE, CAPILLARY
Glucose-Capillary: 223 mg/dL — ABNORMAL HIGH (ref 70–99)
Glucose-Capillary: 227 mg/dL — ABNORMAL HIGH (ref 70–99)
Glucose-Capillary: 228 mg/dL — ABNORMAL HIGH (ref 70–99)
Glucose-Capillary: 217 mg/dL — ABNORMAL HIGH (ref 70–99)

## 2020-12-31 LAB — COMPREHENSIVE METABOLIC PANEL
ALT: 19 U/L (ref 0–44)
AST: 19 U/L (ref 15–41)
Albumin: 3 g/dL — ABNORMAL LOW (ref 3.5–5.0)
Alkaline Phosphatase: 32 U/L — ABNORMAL LOW (ref 38–126)
Anion gap: 11 (ref 5–15)
BUN: 26 mg/dL — ABNORMAL HIGH (ref 6–20)
CO2: 27 mmol/L (ref 22–32)
Calcium: 8.9 mg/dL (ref 8.9–10.3)
Chloride: 101 mmol/L (ref 98–111)
Creatinine, Ser: 1.02 mg/dL (ref 0.61–1.24)
GFR, Estimated: >60 mL/min (ref >60)
Glucose, Bld: 183 mg/dL — ABNORMAL HIGH (ref 70–99)
Potassium: 3.7 mmol/L (ref 3.5–5.1)
Sodium: 139 mmol/L (ref 135–145)
Total Bilirubin: 0.6 mg/dL (ref 0.3–1.2)
Total Protein: 6.1 g/dL — ABNORMAL LOW (ref 6.5–8.1)

## 2020-12-31 LAB — C-REACTIVE PROTEIN: CRP: 9.1 mg/dL — ABNORMAL HIGH (ref <1.0)

## 2020-12-31 LAB — PHOSPHORUS: Phosphorus: 2.8 mg/dL (ref 2.5–4.6)

## 2020-12-31 LAB — MAGNESIUM: Magnesium: 2.2 mg/dL (ref 1.7–2.4)

## 2020-12-31 LAB — FERRITIN: Ferritin: 882 ng/mL — ABNORMAL HIGH (ref 24–336)

## 2020-12-31 LAB — D-DIMER, QUANTITATIVE: D-Dimer, Quant: 0.66 ug/mL-FEU — ABNORMAL HIGH (ref 0.00–0.50)

## 2020-12-31 MED ORDER — INSULIN ASPART 100 UNIT/ML ~~LOC~~ SOLN
3.0000 [IU] | Freq: Three times a day (TID) | SUBCUTANEOUS | Status: DC
  Administered 2021-01-01 – 2021-01-02 (×5): 3 [IU] via SUBCUTANEOUS

## 2020-12-31 MED ORDER — INSULIN GLARGINE 100 UNIT/ML ~~LOC~~ SOLN
8.0000 [IU] | Freq: Every day | SUBCUTANEOUS | Status: DC
  Administered 2021-01-01: 8 [IU] via SUBCUTANEOUS
  Filled 2020-12-31: qty 0.08

## 2020-12-31 MED ORDER — DIPHENHYDRAMINE HCL 25 MG PO CAPS
50.0000 mg | ORAL_CAPSULE | Freq: Every evening | ORAL | Status: DC | PRN
  Administered 2020-12-31: 50 mg via ORAL
  Filled 2020-12-31: qty 2

## 2020-12-31 NOTE — Plan of Care (Signed)
  Problem: Education: Goal: Knowledge of risk factors and measures for prevention of condition will improve Outcome: Progressing   Problem: Coping: Goal: Psychosocial and spiritual needs will be supported Outcome: Progressing   Problem: Respiratory: Goal: Will maintain a patent airway Outcome: Progressing Goal: Complications related to the disease process, condition or treatment will be avoided or minimized Outcome: Progressing   

## 2020-12-31 NOTE — Progress Notes (Signed)
Inpatient Diabetes Program Recommendations  AACE/ADA: New Consensus Statement on Inpatient Glycemic Control   Target Ranges:  Prepandial:   less than 140 mg/dL      Peak postprandial:   less than 180 mg/dL (1-2 hours)      Critically ill patients:  140 - 180 mg/dL   Results for Kevin Reid, Kevin "JEFF" (MRN [id number]) as of 12/31/2020 11:44  Ref. Range 12/30/2020 08:28 12/30/2020 12:51 12/30/2020 17:44 12/30/2020 22:16 12/31/2020 08:22  Glucose-Capillary Latest Ref Range: 70 - 99 mg/dL 332 (H) 951 (H) 884 (H) 222 (H) 223 (H)   Review of Glycemic Control  Diabetes history: DM2 Outpatient Diabetes medications: Metformin 1000 mg BID, Ozempic 0.5 mg Qweek Current orders for Inpatient glycemic control: Novolog 0-20 units TID with meals, Novolog 0-5 units QHS; Solumedrol 54.375 mg Q12H  Inpatient Diabetes Program Recommendations:    Insulin: Patient was ordered Lantus 5 units daily x2 and Lantus 5 units given last this morning. If steroids are continued, please consider ordering Lantus 8 units daily (to start 01/01/21) and Novolog 3 units TID with meals for meal coverage if patient eats at least 50% of meals.  Thanks, Orlando Penner, RN, MSN, CDE Diabetes Coordinator Inpatient Diabetes Program [phone] (Team Pager from 8am to 5pm)

## 2020-12-31 NOTE — Progress Notes (Signed)
PROGRESS NOTE  Kevin Reid [id number] DOB: 08/22/1961 DOA: 12/28/2020 PCP: Kandyce Rud, MD  Brief History   60 year old man PMH includes diabetes mellitus type 2, diagnosed with Covid 12/29 as an outpatient, reported hypoxia at home, came in for shortness of breath.  Admitted for acute hypoxic respiratory failure secondary to Covid pneumonia.  A & P  Acute hypoxic respiratory failure secondary to Covid pneumonia --SPO2 in the 90s on 2 L, no change in oxygen requirement, appears slightly better today.  Continue present treatment.  . CXR on admit: bilateral patchy infiltrates . Fever: no . Oxygen requirement: 2L [place] . Antibiotics: none . Remdesivir: 1/5  > . Steroids: 1/5 > . Actemra/baricitinib: not indicated currently, but pt consented should he worse, will start . Vitamin C and Zinc . Proning: recommended as tolerated   Inflammatory markers: . CRP: 14.3 > 16.7  > 9.1 . D-dimer: .64 > .87 > .66 . Ferritin 529 > 692 > 882 . Procalcitonin: < .10  Diabetes mellitus type 2, hemoglobin A1c 7.2 --CBG remains high.  Will increase Lantus.  Add meal coverage. --Hold Glucophage and Ozempic while hospitalized  Essential hypertension --Remains stable.  Continue Norvasc, bisoprolol and lisinopril.   Disposition Plan:  Discussion: mild improvement  Status is: Inpatient  Remains inpatient appropriate because:IV treatments appropriate due to intensity of illness or inability to take PO and Inpatient level of care appropriate due to severity of illness   Dispo: The patient is from: Home              Anticipated d/c is to: Home              Anticipated d/c date is: 3 days              Patient currently is not medically stable to d/c.  DVT prophylaxis: enoxaparin (LOVENOX) injection 40 mg Start: 12/29/20 1230   Code Status: Full Code Family Communication: none  Brendia Sacks, MD  [facility] Direct contact: see [email or web address] (further directions at bottom of note if  needed) 7PM-7AM contact night coverage as at bottom of note 12/31/2020, 5:27 PM  LOS: 2 days   Significant Hospital Events   .    Consults:  .    Procedures:  .   Significant Diagnostic Tests:  Marland Kitchen    Micro Data:  .    Antimicrobials:  .   Interval History/Subjective  CC: f/u COVID  Feels better today, breathing better though winded when going to bathroom.  Objective   Vitals:  Vitals:   12/31/20 1221 12/31/20 1600  BP: 126/81 138/83  Pulse: (!) 55 60  Resp: 11 20  Temp: 98 F (36.7 C) 98.2 F (36.8 C)  SpO2: 90% 93%    Exam: Constitutional:   . Appears calm, mildly uncomfortable, sitting in chair, not toxic ENMT:  . grossly normal hearing  Respiratory:  . CTA bilaterally, no w/r/r.  . Respiratory effort moderately increased but speaks in full sentences. Dyspneic.  Cardiovascular:  . RRR, no m/r/g . No LE extremity edema   Psychiatric:  . Mental status o Mood, affect appropriate  I have personally reviewed the following:   Today's Data  . CBG high . CMP unremarkable . CRP down, CBC unremarkable  Scheduled Meds: . amLODipine  10 mg Oral Daily  . vitamin C  500 mg Oral Daily  . aspirin EC  81 mg Oral Daily  . bisoprolol  5 mg Oral Daily  . docusate sodium  100 mg Oral  BID  . enoxaparin (LOVENOX) injection  40 mg Subcutaneous Daily  . fenofibrate  160 mg Oral Daily  . insulin aspart  0-20 Units Subcutaneous TID WC  . insulin aspart  0-5 Units Subcutaneous QHS  . [START ON 01/01/2021] insulin aspart  3 Units Subcutaneous TID WC  . [START ON 01/01/2021] insulin glargine  8 Units Subcutaneous Daily  . loratadine  10 mg Oral QPM  . methylPREDNISolone (SOLU-MEDROL) injection  0.5 mg/kg Intravenous Q12H   Followed by  . [START ON 01/02/2021] predniSONE  50 mg Oral Daily  . PARoxetine  60 mg Oral Daily  . sodium chloride flush  3 mL Intravenous Q12H  . sodium chloride flush  3 mL Intravenous Q12H  . zinc sulfate  220 mg Oral Daily   Continuous  Infusions: . sodium chloride    . remdesivir 100 mg in NS 100 mL 100 mg (12/31/20 1044)    Principal Problem:   Acute hypoxemic respiratory failure due to COVID-19 [facility]) Active Problems:   Diabetes mellitus, type 2 (HCC)   Hyperlipemia   Hypertension   Sleep apnea   Pneumonia due to COVID-19 virus   LOS: 2 days   How to contact the [facility] Attending or Consulting provider 7A - 7P or covering provider during after hours 7P -7A, for this patient?  1. Check the care team in [facility] and look for a) attending/consulting TRH provider listed and b) the [facility] team listed 2. Log into [email or web address] and use [place] universal password to access. If you do not have the password, please contact the hospital operator. 3. Locate the [facility] provider you are looking for under [facility] and page to a number that you can be directly reached. 4. If you still have difficulty reaching the provider, please page the [facility] (Director on Call) for the Hospitalists listed on amion for assistance.

## 2020-12-31 NOTE — Progress Notes (Signed)
Heart rate in the low 50's, heart rate dropped to the 30's overnight. Notified MD, order to hold beta blocker received

## 2021-01-01 DIAGNOSIS — E1165 Type 2 diabetes mellitus with hyperglycemia: Secondary | ICD-10-CM | POA: Diagnosis not present

## 2021-01-01 DIAGNOSIS — J9601 Acute respiratory failure with hypoxia: Secondary | ICD-10-CM | POA: Diagnosis not present

## 2021-01-01 DIAGNOSIS — U071 COVID-19: Secondary | ICD-10-CM | POA: Diagnosis not present

## 2021-01-01 DIAGNOSIS — J1282 Pneumonia due to coronavirus disease 2019: Secondary | ICD-10-CM | POA: Diagnosis not present

## 2021-01-01 LAB — CBC WITH DIFFERENTIAL/PLATELET
Abs Immature Granulocytes: 0.11 10*3/uL — ABNORMAL HIGH (ref 0.00–0.07)
Basophils Absolute: 0 10*3/uL (ref 0.0–0.1)
Basophils Relative: 0 %
Eosinophils Absolute: 0 10*3/uL (ref 0.0–0.5)
Eosinophils Relative: 0 %
HCT: 40.8 % (ref 39.0–52.0)
Hemoglobin: 14 g/dL (ref 13.0–17.0)
Immature Granulocytes: 1 %
Lymphocytes Relative: 9 %
Lymphs Abs: 0.7 10*3/uL (ref 0.7–4.0)
MCH: 30.2 pg (ref 26.0–34.0)
MCHC: 34.3 g/dL (ref 30.0–36.0)
MCV: 88.1 fL (ref 80.0–100.0)
Monocytes Absolute: 0.4 10*3/uL (ref 0.1–1.0)
Monocytes Relative: 5 %
Neutro Abs: 6.7 10*3/uL (ref 1.7–7.7)
Neutrophils Relative %: 85 %
Platelets: 236 10*3/uL (ref 150–400)
RBC: 4.63 MIL/uL (ref 4.22–5.81)
RDW: 11.9 % (ref 11.5–15.5)
WBC: 7.9 10*3/uL (ref 4.0–10.5)
nRBC: 0 % (ref 0.0–0.2)

## 2021-01-01 LAB — COMPREHENSIVE METABOLIC PANEL
ALT: 19 U/L (ref 0–44)
AST: 16 U/L (ref 15–41)
Albumin: 3 g/dL — ABNORMAL LOW (ref 3.5–5.0)
Alkaline Phosphatase: 32 U/L — ABNORMAL LOW (ref 38–126)
Anion gap: 11 (ref 5–15)
BUN: 29 mg/dL — ABNORMAL HIGH (ref 6–20)
CO2: 26 mmol/L (ref 22–32)
Calcium: 8.7 mg/dL — ABNORMAL LOW (ref 8.9–10.3)
Chloride: 99 mmol/L (ref 98–111)
Creatinine, Ser: 0.99 mg/dL (ref 0.61–1.24)
GFR, Estimated: >60 mL/min (ref >60)
Glucose, Bld: 219 mg/dL — ABNORMAL HIGH (ref 70–99)
Potassium: 3.9 mmol/L (ref 3.5–5.1)
Sodium: 136 mmol/L (ref 135–145)
Total Bilirubin: 0.5 mg/dL (ref 0.3–1.2)
Total Protein: 6.3 g/dL — ABNORMAL LOW (ref 6.5–8.1)

## 2021-01-01 LAB — C-REACTIVE PROTEIN: CRP: 3.9 mg/dL — ABNORMAL HIGH (ref <1.0)

## 2021-01-01 LAB — PHOSPHORUS: Phosphorus: 3.1 mg/dL (ref 2.5–4.6)

## 2021-01-01 LAB — GLUCOSE, CAPILLARY
Glucose-Capillary: 220 mg/dL — ABNORMAL HIGH (ref 70–99)
Glucose-Capillary: 277 mg/dL — ABNORMAL HIGH (ref 70–99)
Glucose-Capillary: 251 mg/dL — ABNORMAL HIGH (ref 70–99)
Glucose-Capillary: 248 mg/dL — ABNORMAL HIGH (ref 70–99)
Glucose-Capillary: 134 mg/dL — ABNORMAL HIGH (ref 70–99)

## 2021-01-01 LAB — FERRITIN: Ferritin: 821 ng/mL — ABNORMAL HIGH (ref 24–336)

## 2021-01-01 LAB — MAGNESIUM: Magnesium: 2.1 mg/dL (ref 1.7–2.4)

## 2021-01-01 LAB — D-DIMER, QUANTITATIVE: D-Dimer, Quant: 0.37 ug/mL-FEU (ref 0.00–0.50)

## 2021-01-01 MED ORDER — INSULIN GLARGINE 100 UNIT/ML ~~LOC~~ SOLN
8.0000 [IU] | Freq: Every day | SUBCUTANEOUS | Status: DC
  Administered 2021-01-02: 8 [IU] via SUBCUTANEOUS
  Filled 2021-01-01: qty 0.08

## 2021-01-01 MED ORDER — SALINE SPRAY 0.65 % NA SOLN
1.0000 | NASAL | Status: DC | PRN
  Administered 2021-01-02: 1 via NASAL
  Filled 2021-01-01: qty 44

## 2021-01-01 MED ORDER — INSULIN GLARGINE 100 UNIT/ML ~~LOC~~ SOLN: 10.0000 [IU] | Freq: Every day | SUBCUTANEOUS | Status: DC

## 2021-01-01 NOTE — Progress Notes (Addendum)
PROGRESS NOTE  Kevin Reid [id number] DOB: 1961-04-08 DOA: 12/28/2020 PCP: Kandyce Rud, MD  Brief History   60 year old man PMH includes diabetes mellitus type 2, diagnosed with Covid 12/29 as an outpatient, reported hypoxia at home, came in for shortness of breath.  Admitted for acute hypoxic respiratory failure secondary to Covid pneumonia.  A & P  Acute hypoxic respiratory failure secondary to Covid pneumonia --Appears a bit better today, oxygen requirement stable on 2 L, inflammatory markers trending down, overall appears to be improving..  . CXR on admit: bilateral patchy infiltrates . Fever: no . Oxygen requirement: 2L [place] . Antibiotics: none . Remdesivir: 1/5  > . Steroids: 1/5 > . Actemra/baricitinib: not indicated currently, but pt consented should he worsen, would start . Vitamin C and Zinc . Proning: recommended as tolerated   Inflammatory markers: . CRP: 14.3 > 16.7  > 9.1 > 3.9 . D-dimer: .64 > .87 > .66 > .37  . Ferritin 529 > 692 > 882 > 821 . Procalcitonin: < .10  Diabetes mellitus type 2, hemoglobin A1c 7.2 --CBG remains high.  Lantus just increased.  Continue meal coverage.  --Hold Glucophage and Ozempic while hospitalized  Essential hypertension --Stable, continue Norvasc, bisoprolol and lisinopril.   Disposition Plan:  Discussion: mild improvement  Status is: Inpatient  Remains inpatient appropriate because:IV treatments appropriate due to intensity of illness or inability to take PO and Inpatient level of care appropriate due to severity of illness   Dispo: The patient is from: Home              Anticipated d/c is to: Home              Anticipated d/c date is: 3 days              Patient currently is not medically stable to d/c.  DVT prophylaxis: enoxaparin (LOVENOX) injection 40 mg Start: 12/29/20 1230   Code Status: Full Code Family Communication: updated wife yesterday and today  Brendia Sacks, MD  [facility] Direct  contact: see [email or web address] (further directions at bottom of note if needed) 7PM-7AM contact night coverage as at bottom of note 01/01/2021, 10:08 AM  LOS: 3 days   Significant Hospital Events   .    Consults:  .    Procedures:  .   Significant Diagnostic Tests:  Marland Kitchen    Micro Data:  .    Antimicrobials:  .   Interval History/Subjective  CC: f/u COVID  Feeling better today, breathing better, less winded when ambulating.  Objective   Vitals:  Vitals:   01/01/21 0402 01/01/21 0750  BP: (!) 142/93 (!) 160/95  Pulse: (!) 53 60  Resp: 18 17  Temp: 97.8 F (36.6 C) 97.9 F (36.6 C)  SpO2: 92% 95%    Exam: Constitutional:   . Appears calm and comfortable sitting in chair, appears better today ENMT:  . grossly normal hearing  Respiratory:  . CTA bilaterally, no w/r/r.  . Respiratory effort normal.  Cardiovascular:  . RRR, no m/r/g Psychiatric:  . Mental status o Mood, affect appropriate  I have personally reviewed the following:   Today's Data  . CBG 200s . CMP and CBC unremarkable  Scheduled Meds: . amLODipine  10 mg Oral Daily  . vitamin C  500 mg Oral Daily  . aspirin EC  81 mg Oral Daily  . bisoprolol  5 mg Oral Daily  . docusate sodium  100 mg Oral BID  . enoxaparin (LOVENOX) injection  40 mg Subcutaneous Daily  . fenofibrate  160 mg Oral Daily  . insulin aspart  0-20 Units Subcutaneous TID WC  . insulin aspart  0-5 Units Subcutaneous QHS  . insulin aspart  3 Units Subcutaneous TID WC  . [START ON 01/02/2021] insulin glargine  8 Units Subcutaneous Daily  . loratadine  10 mg Oral QPM  . PARoxetine  60 mg Oral Daily  . [START ON 01/02/2021] predniSONE  50 mg Oral Daily  . sodium chloride flush  3 mL Intravenous Q12H  . sodium chloride flush  3 mL Intravenous Q12H  . zinc sulfate  220 mg Oral Daily   Continuous Infusions: . sodium chloride    . remdesivir 100 mg in NS 100 mL 100 mg (01/01/21 0917)    Principal Problem:   Acute hypoxemic respiratory  failure due to COVID-19 [facility]) Active Problems:   Diabetes mellitus, type 2 (HCC)   Hyperlipemia   Hypertension   Sleep apnea   Pneumonia due to COVID-19 virus   LOS: 3 days   How to contact the [facility] Attending or Consulting provider 7A - 7P or covering provider during after hours 7P -7A, for this patient?  1. Check the care team in [facility] and look for a) attending/consulting TRH provider listed and b) the [facility] team listed 2. Log into [email or web address] and use [place]'s universal password to access. If you do not have the password, please contact the hospital operator. 3. Locate the [facility] provider you are looking for under [facility] and page to a number that you can be directly reached. 4. If you still have difficulty reaching the provider, please page the [facility] (Director on Call) for the Hospitalists listed on amion for assistance.

## 2021-01-02 DIAGNOSIS — J9601 Acute respiratory failure with hypoxia: Secondary | ICD-10-CM | POA: Diagnosis not present

## 2021-01-02 DIAGNOSIS — U071 COVID-19: Secondary | ICD-10-CM | POA: Diagnosis not present

## 2021-01-02 DIAGNOSIS — E1165 Type 2 diabetes mellitus with hyperglycemia: Secondary | ICD-10-CM | POA: Diagnosis not present

## 2021-01-02 LAB — CBC WITH DIFFERENTIAL/PLATELET
Abs Immature Granulocytes: 0.31 10*3/uL — ABNORMAL HIGH (ref 0.00–0.07)
Basophils Absolute: 0 10*3/uL (ref 0.0–0.1)
Basophils Relative: 0 %
Eosinophils Absolute: 0 10*3/uL (ref 0.0–0.5)
Eosinophils Relative: 0 %
HCT: 39.6 % (ref 39.0–52.0)
Hemoglobin: 14.2 g/dL (ref 13.0–17.0)
Immature Granulocytes: 3 %
Lymphocytes Relative: 12 %
Lymphs Abs: 1.1 10*3/uL (ref 0.7–4.0)
MCH: 31.4 pg (ref 26.0–34.0)
MCHC: 35.9 g/dL (ref 30.0–36.0)
MCV: 87.6 fL (ref 80.0–100.0)
Monocytes Absolute: 0.7 10*3/uL (ref 0.1–1.0)
Monocytes Relative: 8 %
Neutro Abs: 6.9 10*3/uL (ref 1.7–7.7)
Neutrophils Relative %: 77 %
Platelets: 262 10*3/uL (ref 150–400)
RBC: 4.52 MIL/uL (ref 4.22–5.81)
RDW: 12.2 % (ref 11.5–15.5)
WBC: 9.1 10*3/uL (ref 4.0–10.5)
nRBC: 0.2 % (ref 0.0–0.2)

## 2021-01-02 LAB — PHOSPHORUS: Phosphorus: 3.5 mg/dL (ref 2.5–4.6)

## 2021-01-02 LAB — D-DIMER, QUANTITATIVE: D-Dimer, Quant: 0.42 ug/mL-FEU (ref 0.00–0.50)

## 2021-01-02 LAB — FERRITIN: Ferritin: 628 ng/mL — ABNORMAL HIGH (ref 24–336)

## 2021-01-02 LAB — COMPREHENSIVE METABOLIC PANEL
ALT: 18 U/L (ref 0–44)
AST: 15 U/L (ref 15–41)
Albumin: 3.1 g/dL — ABNORMAL LOW (ref 3.5–5.0)
Alkaline Phosphatase: 35 U/L — ABNORMAL LOW (ref 38–126)
Anion gap: 11 (ref 5–15)
BUN: 29 mg/dL — ABNORMAL HIGH (ref 6–20)
CO2: 26 mmol/L (ref 22–32)
Calcium: 8.9 mg/dL (ref 8.9–10.3)
Chloride: 101 mmol/L (ref 98–111)
Creatinine, Ser: 1.07 mg/dL (ref 0.61–1.24)
GFR, Estimated: >60 mL/min (ref >60)
Glucose, Bld: 84 mg/dL (ref 70–99)
Potassium: 3.4 mmol/L — ABNORMAL LOW (ref 3.5–5.1)
Sodium: 138 mmol/L (ref 135–145)
Total Bilirubin: 0.7 mg/dL (ref 0.3–1.2)
Total Protein: 6 g/dL — ABNORMAL LOW (ref 6.5–8.1)

## 2021-01-02 LAB — GLUCOSE, CAPILLARY
Glucose-Capillary: 114 mg/dL — ABNORMAL HIGH (ref 70–99)
Glucose-Capillary: 216 mg/dL — ABNORMAL HIGH (ref 70–99)

## 2021-01-02 LAB — C-REACTIVE PROTEIN: CRP: 2.1 mg/dL — ABNORMAL HIGH (ref <1.0)

## 2021-01-02 LAB — MAGNESIUM: Magnesium: 2 mg/dL (ref 1.7–2.4)

## 2021-01-02 MED ORDER — ZINC SULFATE 220 (50 ZN) MG PO CAPS: 220.0000 mg | ORAL_CAPSULE | Freq: Every day | ORAL | Status: DC

## 2021-01-02 MED ORDER — PREDNISONE 50 MG PO TABS: 50.0000 mg | ORAL_TABLET | Freq: Every day | ORAL | 0 refills | Status: DC

## 2021-01-02 MED ORDER — ASCORBIC ACID 500 MG PO TABS: 500.0000 mg | ORAL_TABLET | Freq: Every day | ORAL | Status: DC

## 2021-01-02 NOTE — Progress Notes (Addendum)
SATURATION QUALIFICATIONS: (This note is used to comply with regulatory documentation for home oxygen)  Patient Saturations on Room Air at Rest = 95%  Patient Saturations on Room Air while Ambulating = 91%  Please briefly explain why patient needs home oxygen:  Patient's oxygen sats above 90% without O2

## 2021-01-02 NOTE — TOC Transition Note (Signed)
Transition of Care [facility]) - CM/SW Discharge Note   Patient Details  Name: Kevin Reid MRN: [id number] Date of Birth: [birth year]  Transition of Care [facility]) CM/SW Contact:  Kermit Balo, RN Phone Number: 01/02/2021, 11:55 AM   Clinical Narrative:    Pt is discharging home with self care. No needs per TOC.   Final next level of care: Home/Self Care Barriers to Discharge: No Barriers Identified   Patient Goals and CMS Choice        Discharge Placement                       Discharge Plan and Services                                     Social Determinants of Health (SDOH) Interventions     Readmission Risk Interventions No flowsheet data found.

## 2021-01-02 NOTE — Discharge Summary (Signed)
Physician Discharge Summary  Kevin Reid [id number]:[id number] DOB: [birth year] DOA: 12/28/2020  PCP: Derinda Late, MD  Admit date: 12/28/2020 Discharge date: 01/02/2021  Recommendations for Outpatient Follow-up:  1. Recovery from COVID pneumonia   Follow-up Information    Derinda Late, MD. Schedule an appointment as soon as possible for a visit in 1 week(s).   Specialty: Family Medicine Contact information: [address] [facility] [place] [address] [phone]                Discharge Diagnoses: Principal diagnosis is #1 Principal Problem:   Acute hypoxemic respiratory failure due to COVID-19 [facility]) Active Problems:   Diabetes mellitus, type 2 ([place])   Hyperlipemia   Hypertension   Sleep apnea   Pneumonia due to COVID-19 virus   Discharge Condition: improved Disposition: home  Diet recommendation:  Diet Orders (From admission, onward)    Start     Ordered   01/02/21 0000  Diet - low sodium heart healthy        01/02/21 1120   01/02/21 0000  Diet Carb Modified        01/02/21 1120   12/29/20 1216  Diet Carb Modified Fluid consistency: Thin; Room service appropriate? Yes  Diet effective now       Question Answer Comment  Diet-HS Snack? Nothing   Calorie Level Medium 1600-2000   Fluid consistency: Thin   Room service appropriate? Yes      12/29/20 1219           Filed Weights   12/29/20 1032  Weight: 108.9 kg    HPI/Hospital Course:   60 year old man PMH includes diabetes mellitus type 2, diagnosed with Covid 12/29 as an outpatient, reported hypoxia at home, came in for shortness of breath.  Admitted for acute hypoxic respiratory failure secondary to Covid pneumonia.  Treated with steroids and remdesivir with gradual clinical improvement, weaned off oxygen and discharged home in good condition.  Acute hypoxic respiratory failure secondary to Covid pneumonia --Weaned off oxygen, doing quite well, responded  to remdesivir and steroids.  . CXR on admit: bilateral patchy infiltrates . Fever: no . Oxygen requirement:  Room air . Antibiotics: none . Remdesivir: 1/5  > 1/9 . Steroids: 1/5 > complete as outpatient . Actemra/baricitinib: Not indicated . Vitamin C and Zinc   Diabetes mellitus type 2, hemoglobin A1c 7.2 --CBG stable. Resume glucophage and Ozempic on discharge.  Essential hypertension --remained stable, continue Norvasc, bisoprolol and lisinopril.  Today's assessment: S: CC: f/u COVID  Feels much better, breathing better, ambulated 2 laps w/o oxygen and did well. Ready to go home.  O: Vitals:  Vitals:   01/02/21 0555 01/02/21 0700  BP:  (!) 146/88  Pulse:  (!) 53  Resp:  20  Temp:  98.5 F (36.9 C)  SpO2: 97% 97%    Constitutional:  . Appears calm and comfortable, much better today, sitting in chair ENMT:  . grossly normal hearing  Respiratory:  . CTA bilaterally, no w/r/r.  . Respiratory effort normal.  Cardiovascular:  . RRR, no m/r/g . No LE extremity edema   Psychiatric:  . Mental status o Mood, affect appropriate  CBG stable CMP noted CRP, ferritin trending down CBC stable  Discharge Instructions  Discharge Instructions    Diet - low sodium heart healthy   Complete by: As directed    Diet Carb Modified   Complete by: As directed    Discharge instructions   Complete  by: As directed    Call your physician or seek immediate medical attention for fever, shortness of breath, pain, swelling or worsening of condition. Self-isolate until 1/26.   Increase activity slowly   Complete by: As directed      Allergies as of 01/02/2021      Reactions   Codeine    unknown   Simvastatin Other (See Comments)   Chest heaviness and numbness on and off.      Medication List    TAKE these medications   amLODipine 10 MG tablet Commonly known as: NORVASC Take 1 tablet (10 mg total) by mouth daily.   ascorbic acid 500 MG tablet Commonly known as: VITAMIN  C Take 1 tablet (500 mg total) by mouth daily. Start taking on: January 03, 2021   aspirin EC 81 MG tablet Take 81 mg by mouth daily.   bisoprolol-hydrochlorothiazide 5-6.25 MG tablet Commonly known as: ZIAC TAKE TWO TABLETS EVERY DAY What changed:   how much to take  how to take this  when to take this  additional instructions   Blood Glucose Monitor System w/Device Kit Use to test blood sugar once daily   Contour Next Test test strip Generic drug: glucose blood Check sugar once daily. DX E11.9   fenofibrate 145 MG tablet Commonly known as: Tricor Take 1 tablet (145 mg total) by mouth daily.   Lancets Misc 1 each by Does not apply route daily as needed.   levocetirizine 5 MG tablet Commonly known as: XYZAL Take 5 mg by mouth every evening.   metFORMIN 1000 MG tablet Commonly known as: GLUCOPHAGE Take 1 tablet (1,000 mg total) by mouth 2 (two) times daily.   NON FORMULARY CPAP MACHINE USING EVERY NIGHT setting at 10   Ozempic (0.25 or 0.5 MG/DOSE) 2 MG/1.5ML Sopn Generic drug: Semaglutide(0.25 or 0.5MG/DOS) INJECT 0.5 MG INTO THE SKIN ONCE A WEEK What changed:   how much to take  how to take this  when to take this   PARoxetine 20 MG tablet Commonly known as: PAXIL TAKE 3 TABLETS BY MOUTH ONCE DAILY   predniSONE 50 MG tablet Commonly known as: DELTASONE Take 1 tablet (50 mg total) by mouth daily. Start taking on: January 03, 2021   Vitamin D (Ergocalciferol) 1.25 MG (50000 UNIT) Caps capsule Commonly known as: DRISDOL Take 1 capsule (50,000 Units total) by mouth every 7 (seven) days.   zinc sulfate 220 (50 Zn) MG capsule Take 1 capsule (220 mg total) by mouth daily. Start taking on: January 03, 2021      Allergies  Allergen Reactions  . Codeine     unknown  . Simvastatin Other (See Comments)    Chest heaviness and numbness on and off.    The results of significant diagnostics from this hospitalization (including imaging, microbiology,  ancillary and laboratory) are listed below for reference.    Significant Diagnostic Studies: DG Chest Portable 1 View  Result Date: 12/28/2020 CLINICAL DATA:  COVID cough short of breath EXAM: PORTABLE CHEST 1 VIEW COMPARISON:  07/07/2018 FINDINGS: Subtle ground-glass opacities in the left lower lung and in the right thorax. No pleural effusion. Normal heart size. No pneumothorax IMPRESSION: Suspicion of subtle ground-glass opacities/pneumonia in the left lower lung and right thorax. Electronically Signed   By: Donavan Foil M.D.   On: 12/28/2020 22:17    Microbiology: Recent Results (from the past 240 hour(s))  Blood Culture (routine x 2)     Status: None (Preliminary result)  Collection Time: 12/29/20  9:15 AM   Specimen: BLOOD LEFT FOREARM  Result Value Ref Range Status   Specimen Description BLOOD LEFT FOREARM  Final   Special Requests   Final    BOTTLES DRAWN AEROBIC AND ANAEROBIC Blood Culture adequate volume   Culture   Final    NO GROWTH 4 DAYS Performed at [facility], [address] [place], [place] [address]    Report Status PENDING  Incomplete  Resp Panel by RT-PCR (Flu A&B, Covid) Nasopharyngeal Swab     Status: Abnormal   Collection Time: 12/29/20  9:30 AM   Specimen: Nasopharyngeal Swab; Nasopharyngeal(NP) swabs in vial transport medium  Result Value Ref Range Status   SARS Coronavirus 2 by RT PCR POSITIVE (A) NEGATIVE Final    Comment: emailed L. Berdik RN 10:55 12/29/20 ([email or web address]) (NOTE) SARS-CoV-2 target nucleic acids are DETECTED.  The SARS-CoV-2 RNA is generally detectable in upper respiratory specimens during the acute phase of infection. Positive results are indicative of the presence of the identified virus, but do not rule out bacterial infection or co-infection with other pathogens not detected by the test. Clinical correlation with patient history and other diagnostic information is necessary to determine patient infection status. The expected  result is Negative.  Fact Sheet for Patients: EntrepreneurPulse.com.au  Fact Sheet for Healthcare Providers: IncredibleEmployment.be  This test is not yet approved or cleared by the [place] FDA and  has been authorized for detection and/or diagnosis of SARS-CoV-2 by FDA under an Emergency Use Authorization (EUA).  This EUA will remain in effect (meaning this test can be used) for the duration of  the COVID-19  declaration under Section 564(b)(1) of the Act, 21 U.S.C. section 360bbb-3(b)(1), unless the authorization is terminated or revoked sooner.     Influenza A by PCR NEGATIVE NEGATIVE Final   Influenza B by PCR NEGATIVE NEGATIVE Final    Comment: (NOTE) The Xpert Xpress SARS-CoV-2/FLU/RSV plus assay is intended as an aid in the diagnosis of influenza from Nasopharyngeal swab specimens and should not be used as a sole basis for treatment. Nasal washings and aspirates are unacceptable for Xpert Xpress SARS-CoV-2/FLU/RSV testing.  Fact Sheet for Patients: EntrepreneurPulse.com.au  Fact Sheet for Healthcare Providers: IncredibleEmployment.be  This test is not yet approved or cleared by the [place] FDA and has been authorized for detection and/or diagnosis of SARS-CoV-2 by FDA under an Emergency Use Authorization (EUA). This EUA will remain in effect (meaning this test can be used) for the duration of the COVID-19 declaration under Section 564(b)(1) of the Act, 21 U.S.C. section 360bbb-3(b)(1), unless the authorization is terminated or revoked.  Performed at [facility], [place] [address] [place], [place] [address]   Blood Culture (routine x 2)     Status: None (Preliminary result)   Collection Time: 12/29/20  9:30 AM   Specimen: BLOOD RIGHT ARM  Result Value Ref Range Status   Specimen Description BLOOD RIGHT ARM  Final   Special Requests   Final    BOTTLES DRAWN AEROBIC AND  ANAEROBIC Blood Culture results may not be optimal due to an excessive volume of blood received in culture bottles   Culture   Final    NO GROWTH 4 DAYS Performed at [facility], [place] [address], [place], [place] [address]    Report Status PENDING  Incomplete     Labs: Basic Metabolic Panel: Recent Labs  Lab 12/29/20 0930 12/30/20 0601 12/31/20 0057 01/01/21 0119 01/02/21 5993  NA 135 137 139 136 138  K 3.0* 3.7 3.7 3.9 3.4*  CL 99 99 101 99 101  CO2 _0 GLUCOSE 167* 228* 183* 219* 84  BUN 27* 26* 26* 29* 29*  CREATININE 1.22 1.00 1.02 0.99 1.07  CALCIUM 8.5* 8.9 8.9 8.7* 8.9  MG  --  2.2 2.2 2.1 2.0  PHOS  --  3.2 2.8 3.1 3.5   Liver Function Tests: Recent Labs  Lab 12/29/20 0930 12/30/20 0601 12/31/20 0057 01/01/21 0119 01/02/21 0323  AST _1 ALT _2 ALKPHOS 31* 32* 32* 32* 35*  BILITOT 0.7 0.6 0.6 0.5 0.7  PROT 6.3* 6.5 6.1* 6.3* 6.0*  ALBUMIN 3.2* 3.1* 3.0* 3.0* 3.1*   CBC: Recent Labs  Lab 12/29/20 0930 12/30/20 0601 12/31/20 0057 01/01/21 0119 01/02/21 0323  WBC 5.4 5.9 7.8 7.9 9.1  NEUTROABS 4.4 5.0 6.9 6.7 6.9  HGB 13.0 13.2 13.7 14.0 14.2  HCT 36.0* 37.2* 39.9 40.8 39.6  MCV 87.0 88.4 88.5 88.1 87.6  PLT 130* 158 199 236 262   CBG: Recent Labs  Lab 01/01/21 1733 01/01/21 2020 01/01/21 2303 01/02/21 0758 01/02/21 1219  GLUCAP 251* 248* 134* 114* 216*    Principal Problem:   Acute hypoxemic respiratory failure due to COVID-19 [facility]) Active Problems:   Diabetes mellitus, type 2 ([place])   Hyperlipemia   Hypertension   Sleep apnea   Pneumonia due to COVID-19 virus   Time coordinating discharge: 25 minutes  Signed:  Murray Hodgkins, MD  [facility]  01/02/2021, 6:08 PM

## 2021-01-02 NOTE — Progress Notes (Signed)
Patient discharging home.Vital signs stable at time of discharge as reflected in discharge summary. Discharge instructions given and verbal  understanding returned. Patient to follow up with PCP within a week. Patient has no questions at this time.

## 2021-01-03 LAB — CULTURE, BLOOD (ROUTINE X 2)
Culture: NO GROWTH
Culture: NO GROWTH
Special Requests: ADEQUATE

## 2021-01-13 ENCOUNTER — Other Ambulatory Visit: Payer: Self-pay | Admitting: *Deleted

## 2021-01-13 NOTE — Patient Outreach (Signed)
[facility]) Care Management  01/13/2021  Kevin Reid Jul 02, 1961 [id number]   Telephone Assess/Post hospital discharge-Unsuccessful  RN attempted outreach today however unsuccessful. RN able to leave a HIPAA approved voice message requesting a call back.  Will attempted another outreach call over the next week.  [name], RN Care Management Coordinator [facility] Main Office [phone]

## 2021-01-19 ENCOUNTER — Other Ambulatory Visit: Payer: Self-pay | Admitting: *Deleted

## 2021-01-19 NOTE — Patient Outreach (Signed)
[facility]) Care Management  01/19/2021  Kevin Reid 09/17/1961 [id number]    Telephone Assessment-HTN  RN spoke with pt today and received an update on his ongoing management of care. Pt reports some ongoing stressors as he and his wife are moving into a new home however promise to improve his ongoing help with better eating habits and food choices over the next month. States both his and his wife had Covid and he has decided to get the vaccine. Praise pt for wanting to improve his overall health with better eating habits and offered to assist if needed along the way. Documented reprorted blood pressures in the dicussed plan of care and updated accordingly with pt being on track with his ongoing management of care however minimal in some area of improvement. Will strongly encouraged adherence to all discussed today and again stress the importacne of reducing the stressors to lower the risk of encountering acute events.  Will continue to communicate with the pt's provider on his disposition with [facility] services. Will follow up next month with ongoing case management services.  Goals Addressed            This Visit's Progress   . THN-Lifestyle Change   On track    Follow Up Date 02/17/2021 Timeframe:  Long-Range Goal Priority:  Medium Start Date:    12/23/2020                         Expected End Date:  04/22/2021                  - agree to work together to make changes - ask questions to understand - learn about high blood pressure    Why is this important?   The changes that you are asked to make may be hard to do.  This is especially true when the changes are life-long.  Knowing why it is important to you is the first step.  Working on the change with your family or support person helps you not feel alone.  Reward yourself and family or support person when goals are met. This can be an activity you choose like bowling, hiking, biking, swimming or shooting hoops.      Notes:   1/26-Pt reports stressors related to a new move into a home and BP are slightly elevated. Pt reports he is starting a new trend with dietary changes to improve his health after this move. Him and his wife will be working together with eating healthier food items. RN will offer to assist with healthy choice items when necessary.  Spouse reports pt continue to monitor his blood pressures documenting readings on his phone. Pt continues to improve in his habits in managing his ongoing HTN. Will continue to encouraged ongoing exercises to improve his overall health. Will extend to allow ongoing adherence.    . THN-Stop or Cut Down Having Caffeine   On track    Follow Up Date 2/24//2022 Timeframe:  Short-Term Goal Priority:  Medium Start Date:        12/23/2020                     Expected End Date:     02/21/2021                    - change drinks with caffeine to those without caffeine - reduce caffeine intake a little at a time  Why is this important?   If you want to decrease the amount of caffeine there are steps you can take to do it.  First, learn to read labels to see if a food or drink contains caffeine. Then, take steps to cut back or stop.    Notes:  1/27-Verified pt attempts to improve on this goal however pt moving and has more stress this month but has indicated he will restart eating healthier once moved. Pt continue to do well with his caffeine but again will much improve on his dietary habits over the next month. Spouse continue to encouraged pt to Increase his water intake. Confirm pt has improved with his dietary intake related t his blood pressure.    Baker Pierini and Manage My Blood Pressure   On track    Follow Up Date 02/17/2021 Timeframe:  Long-Range Goal Priority:  Medium Start Date:         12/23/2020                    Expected End Date:  04/22/2021                    - check blood pressure daily - choose a place to take my blood pressure (home,  clinic or office, retail store) - write blood pressure results in a log or diary    Why is this important?   You won't feel high blood pressure, but it can still hurt your blood vessels.  High blood pressure can cause heart or kidney problems. It can also cause a stroke.  Making lifestyle changes like losing a little weight or eating less salt will help.  Checking your blood pressure at home and at different times of the day can help to control blood pressure.  If the doctor prescribes medicine remember to take it the way the doctor ordered.  Call the office if you cannot afford the medicine or if there are questions about it.     Notes:  1/26-Pt reports some elevated blood pressures due to some ongoing stressors with a recent move into a new home. Pt reports current readings at 150/80-90 with no symptoms reported at this time. Will reiterated on the importance of reducing stressors to lower the risk for acute symptoms.   Reports daily blood pressures. Encouraged pt to document all reading in the [facility] calendar for providers to see.       Raina Mina, RN Care Management Coordinator [place] Office [phone]

## 2021-01-24 ENCOUNTER — Ambulatory Visit: Payer: 59 | Admitting: *Deleted

## 2021-01-24 ENCOUNTER — Ambulatory Visit: Payer: 59 | Attending: Internal Medicine

## 2021-01-24 DIAGNOSIS — Z23 Encounter for immunization: Secondary | ICD-10-CM

## 2021-01-24 NOTE — Progress Notes (Signed)
   Covid-19 Vaccination Clinic  Name:  Kevin Reid    MRN: [id number] DOB: 10-13-1961  01/24/2021  Mr. Kevin Reid was observed post Covid-19 immunization for 15 minutes without incident. He was provided with Vaccine Information Sheet and instruction to access the V-Safe system.   Mr. Kevin Reid was instructed to call 911 with any severe reactions post vaccine: Marland Kitchen Difficulty breathing  . Swelling of face and throat  . A fast heartbeat  . A bad rash all over body  . Dizziness and weakness   Immunizations Administered    Name Date Dose VIS Date Route   PFIZER Comrnaty(Gray TOP) Covid-19 Vaccine 01/24/2021  4:16 PM 0.3 mL 12/02/2020 Intramuscular   Manufacturer: [employer], [employer]   Lot: [id number]   NDC: [phone]

## 2021-02-02 DIAGNOSIS — I208 Other forms of angina pectoris: Secondary | ICD-10-CM | POA: Diagnosis not present

## 2021-02-02 DIAGNOSIS — R0600 Dyspnea, unspecified: Secondary | ICD-10-CM | POA: Diagnosis not present

## 2021-02-02 DIAGNOSIS — R001 Bradycardia, unspecified: Secondary | ICD-10-CM | POA: Diagnosis not present

## 2021-02-02 DIAGNOSIS — R5383 Other fatigue: Secondary | ICD-10-CM | POA: Diagnosis not present

## 2021-02-14 ENCOUNTER — Ambulatory Visit: Payer: 59

## 2021-02-15 ENCOUNTER — Ambulatory Visit: Payer: 59 | Attending: Internal Medicine

## 2021-02-15 DIAGNOSIS — Z23 Encounter for immunization: Secondary | ICD-10-CM

## 2021-02-15 NOTE — Progress Notes (Signed)
   Covid-19 Vaccination Clinic  Name:  Kevin Reid    MRN: [id number] DOB: 1961-11-30  02/15/2021  [name] was observed post Covid-19 immunization for 15 minutes without incident. He was provided with Vaccine Information Sheet and instruction to access the V-Safe system.   [name] was instructed to call 911 with any severe reactions post vaccine: Marland Kitchen Difficulty breathing  . Swelling of face and throat  . A fast heartbeat  . A bad rash all over body  . Dizziness and weakness   Immunizations Administered    Name Date Dose VIS Date Route   PFIZER Comrnaty(Gray TOP) Covid-19 Vaccine 02/15/2021  4:03 PM 0.3 mL 12/02/2020 Intramuscular   Manufacturer: [employer], [employer]   Lot: [id number]   NDC: [phone]

## 2021-02-17 ENCOUNTER — Other Ambulatory Visit: Payer: Self-pay | Admitting: *Deleted

## 2021-02-17 NOTE — Patient Outreach (Signed)
[facility]) Care Management  02/17/2021  Kevin Reid 04-26-1961 [id number]   Telephone Assessment-Unsuccessful  RN attempted outreach call however unsuccessful. RN able to leave a HIPAA approved voice message requesting a call back.  Will rescheduled another outreach over the next week for ongoing [facility] services.  [name], RN Care Management Coordinator [facility] Main Office [phone]

## 2021-02-23 ENCOUNTER — Other Ambulatory Visit: Payer: Self-pay | Admitting: *Deleted

## 2021-02-23 NOTE — Patient Outreach (Signed)
[facility]) Care Management  02/23/2021  Hanz Winterhalter January 20, 1961 [id number]   Telephone Assessment-Successful-HTN  RN spoke with pt today and received an update on his ongoing management of care. Pt reports he and his family recently moved into their new home so his blood pressures is getting better with a lower stress level. Pt recites some of his readings noted within the plan of care. Review and discussed goals and interventions as pt continue to managing his HTN very well. No changes in medications and Dr .Baldemar Lenis continue to manage this condition.   Will continue to encourage adherence with all discussed today. Pt feels he no longer needs monthly follow up calls however receptive to quarterly calls from [facility] for ongoing disease management needs.  Will update provider's office accordingly on pt's ongoing disposition with [facility] services. Next follow up scheduled in June (quarterly) as pt has requested. No other needs to address at this time.  Goals Addressed            This Visit's Progress   . THN-Lifestyle Change   On track    Follow Up Date 05/26/2021 Timeframe:  Long-Range Goal Priority:  Medium Start Date:    12/23/2020                         Expected End Date:  06/22/2021                  - agree to work together to make changes - ask questions to understand - learn about high blood pressure    Why is this important?   The changes that you are asked to make may be hard to do.  This is especially true when the changes are life-long.  Knowing why it is important to you is the first step.  Working on the change with your family or support person helps you not feel alone.  Reward yourself and family or support person when goals are met. This can be an activity you choose like bowling, hiking, biking, swimming or shooting hoops.     Notes:  3/2- Pt continue to be positive with his ongoing improvement related to his blood pressures. Pt states him and his  spouse just moved into their new home and his stress levels are reducing resulting in improved blood pressures.  1/26-Pt reports stressors related to a new move into a home and BP are slightly elevated. Pt reports he is starting a new trend with dietary changes to improve his health after this move. Him and his wife will be working together with eating healthier food items. RN will offer to assist with healthy choice items when necessary.  Spouse reports pt continue to monitor his blood pressures documenting readings on his phone. Pt continues to improve in his habits in managing his ongoing HTN. Will continue to encouraged ongoing exercises to improve his overall health. Will extend to allow ongoing adherence.    . COMPLETED: THN-Stop or Cut Down Having Caffeine   On track    Follow Up Date 2/24//2022 Timeframe:  Short-Term Goal Priority:  Medium Start Date:        12/23/2020                     Expected End Date:     02/21/2021                    - change drinks with caffeine to those  without caffeine - reduce caffeine intake a little at a time    Why is this important?   If you want to decrease the amount of caffeine there are steps you can take to do it.  First, learn to read labels to see if a food or drink contains caffeine. Then, take steps to cut back or stop.    Notes:  1/27-Verified pt attempts to improve on this goal however pt moving and has more stress this month but has indicated he will restart eating healthier once moved. Pt continue to do well with his caffeine but again will much improve on his dietary habits over the next month. Spouse continue to encouraged pt to Increase his water intake. Confirm pt has improved with his dietary intake related t his blood pressure.    Baker Pierini and Manage My Blood Pressure   On track    Follow Up Date 05/26/2021 Timeframe:  Long-Range Goal Priority:  Medium Start Date:         12/23/2020                    Expected End Date:  06/23/2021                     - check blood pressure daily - choose a place to take my blood pressure (home, clinic or office, retail store) - write blood pressure results in a log or diary    Why is this important?   You won't feel high blood pressure, but it can still hurt your blood vessels.  High blood pressure can cause heart or kidney problems. It can also cause a stroke.  Making lifestyle changes like losing a little weight or eating less salt will help.  Checking your blood pressure at home and at different times of the day can help to control blood pressure.  If the doctor prescribes medicine remember to take it the way the doctor ordered.  Call the office if you cannot afford the medicine or if there are questions about it.     Notes:  02/23/2021-Pt reports his ongoing blood pressures ranging from 139/86-141/86 as he remains asymptomatic. No changes in his ongoing medications.  1/26-Pt reports some elevated blood pressures due to some ongoing stressors with a recent move into a new home. Pt reports current readings at 150/80-90 with no symptoms reported at this time. Will reiterated on the importance of reducing stressors to lower the risk for acute symptoms.   Reports daily blood pressures. Encouraged pt to document all reading in the [facility] calendar for providers to see.       Raina Mina, RN Care Management Coordinator [place] Office [phone]

## 2021-03-15 DIAGNOSIS — Z79899 Other long term (current) drug therapy: Secondary | ICD-10-CM | POA: Diagnosis not present

## 2021-03-15 DIAGNOSIS — Z125 Encounter for screening for malignant neoplasm of prostate: Secondary | ICD-10-CM | POA: Diagnosis not present

## 2021-03-15 DIAGNOSIS — E119 Type 2 diabetes mellitus without complications: Secondary | ICD-10-CM | POA: Diagnosis not present

## 2021-03-15 DIAGNOSIS — E7800 Pure hypercholesterolemia, unspecified: Secondary | ICD-10-CM | POA: Diagnosis not present

## 2021-03-18 DIAGNOSIS — M25512 Pain in left shoulder: Secondary | ICD-10-CM | POA: Diagnosis not present

## 2021-03-18 DIAGNOSIS — G4733 Obstructive sleep apnea (adult) (pediatric): Secondary | ICD-10-CM | POA: Diagnosis not present

## 2021-03-22 DIAGNOSIS — E1159 Type 2 diabetes mellitus with other circulatory complications: Secondary | ICD-10-CM | POA: Diagnosis not present

## 2021-03-22 DIAGNOSIS — E7800 Pure hypercholesterolemia, unspecified: Secondary | ICD-10-CM | POA: Diagnosis not present

## 2021-03-22 DIAGNOSIS — Z79899 Other long term (current) drug therapy: Secondary | ICD-10-CM | POA: Diagnosis not present

## 2021-03-22 DIAGNOSIS — Z0000 Encounter for general adult medical examination without abnormal findings: Secondary | ICD-10-CM | POA: Diagnosis not present

## 2021-04-18 DIAGNOSIS — G4733 Obstructive sleep apnea (adult) (pediatric): Secondary | ICD-10-CM | POA: Diagnosis not present

## 2021-05-02 DIAGNOSIS — R5383 Other fatigue: Secondary | ICD-10-CM | POA: Diagnosis not present

## 2021-05-02 DIAGNOSIS — I208 Other forms of angina pectoris: Secondary | ICD-10-CM | POA: Diagnosis not present

## 2021-05-02 DIAGNOSIS — R0600 Dyspnea, unspecified: Secondary | ICD-10-CM | POA: Diagnosis not present

## 2021-05-02 DIAGNOSIS — R001 Bradycardia, unspecified: Secondary | ICD-10-CM | POA: Diagnosis not present

## 2021-05-16 DIAGNOSIS — J309 Allergic rhinitis, unspecified: Secondary | ICD-10-CM | POA: Diagnosis not present

## 2021-05-18 DIAGNOSIS — G4733 Obstructive sleep apnea (adult) (pediatric): Secondary | ICD-10-CM | POA: Diagnosis not present

## 2021-05-26 ENCOUNTER — Other Ambulatory Visit: Payer: Self-pay | Admitting: *Deleted

## 2021-05-26 NOTE — Patient Outreach (Signed)
[facility]) Care Management  05/26/2021  Korin Hartwell 02-18-1961 [id number]   Telephone Assessment-Successful-HTN  RN spoke with spoke today with update on pt's ongoing management of care. Reports pt's continue to do well with no acute issues mentioned, Plan of care reviewed and discussed with noted changes and updates with the plan of care.   Will continue with quarterly updates and provide provider with an update on pt's disposition with [facility] services. Addressed all inquires with no additional needs at this time.  Goals Addressed            This Visit's Progress   . THN-Lifestyle Change   On track    Follow Up Date 08/26/2021 Timeframe:  Long-Range Goal Priority:  Medium Start Date:    12/23/2020                         Expected End Date:  09/23/2021                  - agree to work together to make changes - ask questions to understand - learn about high blood pressure    Why is this important?   The changes that you are asked to make may be hard to do.  This is especially true when the changes are life-long.  Knowing why it is important to you is the first step.  Working on the change with your family or support person helps you not feel alone.  Reward yourself and family or support person when goals are met. This can be an activity you choose like bowling, hiking, biking, swimming or shooting hoops.     Notes:  6/2-Caregiver reports pt continue to do well with no new issues. Pt managing his blood pressures with no acute readings. Stress levels are down after a recent move. No additional needs at this time. 3/2- Pt continue to be positive with his ongoing improvement related to his blood pressures. Pt states him and his spouse just moved into their new home and his stress levels are reducing resulting in improved blood pressures.  1/26-Pt reports stressors related to a new move into a home and BP are slightly elevated. Pt reports he is starting a new trend  with dietary changes to improve his health after this move. Him and his wife will be working together with eating healthier food items. RN will offer to assist with healthy choice items when necessary.  Spouse reports pt continue to monitor his blood pressures documenting readings on his phone. Pt continues to improve in his habits in managing his ongoing HTN. Will continue to encouraged ongoing exercises to improve his overall health. Will extend to allow ongoing adherence.    Baker Pierini and Manage My Blood Pressure   On track    Follow Up Date 08/26/2021 Timeframe:  Long-Range Goal Priority:  Medium Start Date:         12/23/2020                    Expected End Date:  09/23/2021                    - check blood pressure daily - choose a place to take my blood pressure (home, clinic or office, retail store) - write blood pressure results in a log or diary   Barriers: Health Behaviors  Why is this important?   You won't feel high blood pressure, but it can  still hurt your blood vessels.  High blood pressure can cause heart or kidney problems. It can also cause a stroke.  Making lifestyle changes like losing a little weight or eating less salt will help.  Checking your blood pressure at home and at different times of the day can help to control blood pressure.  If the doctor prescribes medicine remember to take it the way the doctor ordered.  Call the office if you cannot afford the medicine or if there are questions about it.     Notes:  6/2-Reports pt continue to monitor his ongoing blood pressures with no acute readings and pt remains asymptomatic with no reports changes with his medications.  02/23/2021-Pt reports his ongoing blood pressures ranging from 139/86-141/86 as he remains asymptomatic. No changes in his ongoing medications.  1/26-Pt reports some elevated blood pressures due to some ongoing stressors with a recent move into a new home. Pt reports current readings at 150/80-90 with  no symptoms reported at this time. Will reiterated on the importance of reducing stressors to lower the risk for acute symptoms.   Reports daily blood pressures. Encouraged pt to document all reading in the [facility] calendar for providers to see.       Raina Mina, RN Care Management Coordinator [place] Office [phone]

## 2021-05-27 DIAGNOSIS — G4733 Obstructive sleep apnea (adult) (pediatric): Secondary | ICD-10-CM | POA: Diagnosis not present

## 2021-05-27 DIAGNOSIS — I10 Essential (primary) hypertension: Secondary | ICD-10-CM | POA: Diagnosis not present

## 2021-05-27 DIAGNOSIS — Z9989 Dependence on other enabling machines and devices: Secondary | ICD-10-CM | POA: Diagnosis not present

## 2021-06-06 DIAGNOSIS — M25511 Pain in right shoulder: Secondary | ICD-10-CM | POA: Diagnosis not present

## 2021-06-06 DIAGNOSIS — M75102 Unspecified rotator cuff tear or rupture of left shoulder, not specified as traumatic: Secondary | ICD-10-CM | POA: Diagnosis not present

## 2021-06-18 DIAGNOSIS — G4733 Obstructive sleep apnea (adult) (pediatric): Secondary | ICD-10-CM | POA: Diagnosis not present

## 2021-06-21 DIAGNOSIS — M75102 Unspecified rotator cuff tear or rupture of left shoulder, not specified as traumatic: Secondary | ICD-10-CM | POA: Diagnosis not present

## 2021-06-29 ENCOUNTER — Other Ambulatory Visit: Payer: Self-pay | Admitting: Family Medicine

## 2021-07-04 ENCOUNTER — Emergency Department
Admission: EM | Admit: 2021-07-04 | Discharge: 2021-07-05 | Disposition: A | Discharge status: Home / Self Care | Payer: 59 | Attending: Emergency Medicine | Admitting: Emergency Medicine

## 2021-07-04 DIAGNOSIS — Z8616 Personal history of COVID-19: Secondary | ICD-10-CM | POA: Diagnosis not present

## 2021-07-04 DIAGNOSIS — Z87891 Personal history of nicotine dependence: Secondary | ICD-10-CM | POA: Diagnosis not present

## 2021-07-04 DIAGNOSIS — Z79899 Other long term (current) drug therapy: Secondary | ICD-10-CM | POA: Insufficient documentation

## 2021-07-04 DIAGNOSIS — R079 Chest pain, unspecified: Secondary | ICD-10-CM | POA: Diagnosis not present

## 2021-07-04 DIAGNOSIS — R739 Hyperglycemia, unspecified: Secondary | ICD-10-CM | POA: Diagnosis not present

## 2021-07-04 DIAGNOSIS — R0789 Other chest pain: Secondary | ICD-10-CM | POA: Diagnosis not present

## 2021-07-04 DIAGNOSIS — Z7984 Long term (current) use of oral hypoglycemic drugs: Secondary | ICD-10-CM | POA: Insufficient documentation

## 2021-07-04 DIAGNOSIS — E1165 Type 2 diabetes mellitus with hyperglycemia: Secondary | ICD-10-CM | POA: Diagnosis not present

## 2021-07-04 DIAGNOSIS — R0902 Hypoxemia: Secondary | ICD-10-CM | POA: Diagnosis not present

## 2021-07-04 DIAGNOSIS — I10 Essential (primary) hypertension: Secondary | ICD-10-CM | POA: Insufficient documentation

## 2021-07-04 DIAGNOSIS — M75112 Incomplete rotator cuff tear or rupture of left shoulder, not specified as traumatic: Secondary | ICD-10-CM | POA: Diagnosis not present

## 2021-07-04 DIAGNOSIS — Z7982 Long term (current) use of aspirin: Secondary | ICD-10-CM | POA: Insufficient documentation

## 2021-07-04 DIAGNOSIS — Z20822 Contact with and (suspected) exposure to covid-19: Secondary | ICD-10-CM | POA: Diagnosis not present

## 2021-07-05 ENCOUNTER — Emergency Department: Payer: 59

## 2021-07-05 DIAGNOSIS — E1165 Type 2 diabetes mellitus with hyperglycemia: Secondary | ICD-10-CM | POA: Diagnosis not present

## 2021-07-05 DIAGNOSIS — R0789 Other chest pain: Secondary | ICD-10-CM | POA: Diagnosis not present

## 2021-07-05 DIAGNOSIS — R079 Chest pain, unspecified: Secondary | ICD-10-CM | POA: Diagnosis not present

## 2021-07-05 LAB — CBC
HCT: 33.2 % — ABNORMAL LOW (ref 39.0–52.0)
Hemoglobin: 12.2 g/dL — ABNORMAL LOW (ref 13.0–17.0)
MCH: 31.9 pg (ref 26.0–34.0)
MCHC: 36.7 g/dL — ABNORMAL HIGH (ref 30.0–36.0)
MCV: 86.7 fL (ref 80.0–100.0)
Platelets: 176 10*3/uL (ref 150–400)
RBC: 3.83 MIL/uL — ABNORMAL LOW (ref 4.22–5.81)
RDW: 12.9 % (ref 11.5–15.5)
WBC: 4 10*3/uL (ref 4.0–10.5)
nRBC: 0.5 % — ABNORMAL HIGH (ref 0.0–0.2)

## 2021-07-05 LAB — COMPREHENSIVE METABOLIC PANEL
ALT: 32 U/L (ref 0–44)
AST: 29 U/L (ref 15–41)
Albumin: 3.7 g/dL (ref 3.5–5.0)
Alkaline Phosphatase: 94 U/L (ref 38–126)
Anion gap: 14 (ref 5–15)
BUN: 16 mg/dL (ref 6–20)
CO2: 23 mmol/L (ref 22–32)
Calcium: 8.6 mg/dL — ABNORMAL LOW (ref 8.9–10.3)
Chloride: 96 mmol/L — ABNORMAL LOW (ref 98–111)
Creatinine, Ser: 1.05 mg/dL (ref 0.61–1.24)
GFR, Estimated: >60 mL/min (ref >60)
Glucose, Bld: 333 mg/dL — ABNORMAL HIGH (ref 70–99)
Potassium: 3.5 mmol/L (ref 3.5–5.1)
Sodium: 133 mmol/L — ABNORMAL LOW (ref 135–145)
Total Bilirubin: 1.3 mg/dL — ABNORMAL HIGH (ref 0.3–1.2)
Total Protein: 6.2 g/dL — ABNORMAL LOW (ref 6.5–8.1)

## 2021-07-05 LAB — TROPONIN I (HIGH SENSITIVITY)
Troponin I (High Sensitivity): 9 ng/L (ref <18)
Troponin I (High Sensitivity): 10 ng/L (ref <18)

## 2021-07-05 LAB — RESP PANEL BY RT-PCR (FLU A&B, COVID) ARPGX2
Influenza A by PCR: NEGATIVE
Influenza B by PCR: NEGATIVE
SARS Coronavirus 2 by RT PCR: NEGATIVE

## 2021-07-05 LAB — CBG MONITORING, ED: Glucose-Capillary: 240 mg/dL — ABNORMAL HIGH (ref 70–99)

## 2021-07-05 MED ORDER — LACTATED RINGERS IV BOLUS
1000.0000 mL | Freq: Once | INTRAVENOUS | Status: AC
  Administered 2021-07-05: 1000 mL via INTRAVENOUS

## 2021-07-05 NOTE — ED Triage Notes (Signed)
Patient from home via EMS for chest tightness and overall "not feeling well". Patient given 324 ASA prior to arrival. Patient is a diabetic, BG was 368 and patient states it is never that high. Patient is on 2LPM via [place], does not wear oxygen at home. Patient is AxOx4.

## 2021-07-05 NOTE — ED Provider Notes (Signed)
[facility] Emergency Department Provider Note  ____________________________________________  Time seen: Approximately 4:32 AM  I have reviewed the triage vital signs and the nursing notes.   HISTORY  Chief Complaint Chest Pain   HPI Kevin Reid is a 60 y.o. male with a history of GERD, diabetes, hypertension, hyperlipidemia who presents for evaluation of chest pain.  Patient reports that he had a normal day today and was feeling pretty well.  Went to the movies and had a lot of junk food including popcorn, several cups of soda and candy.  He went home and was laying down to go to bed around 10 PM when he reports having a massive body cramp and then an episode of chest pain that he described as a pretty severe tightness in his chest.  He denies shortness of breath.  His symptoms resolved after several minutes.  No abdominal pain, no nausea or vomiting, no fever or chills.  When EMS arrived patient's blood glucose was 368 which per patient is very high.  Patient also describes that over the last several days has been having indigestion which she describes as burning sensation in his chest.  Has headed pretty persistently over the last several days.  He denies any known history of coronary artery disease.  No personal family history of PE or DVT, no recent travel immobilization, no leg pain or swelling, no hemoptysis or exogenous hormones.  Patient reports that 3 weeks ago his wife, himself, and another family member had COVID-like symptoms but tested negative at home.  He still has a mild lingering cough.  Denies any hemoptysis.  Past Medical History:  Diagnosis Date   Allergy    Depression    Diabetes mellitus without complication (HCC)    GERD (gastroesophageal reflux disease)    Heart murmur    Hyperlipidemia    Hypertension    Joint pain    Sleep apnea     Patient Active Problem List   Diagnosis Date Noted   Pneumonia due to COVID-19 virus 12/30/2020    Acute hypoxemic respiratory failure due to COVID-19 ([place]) 12/29/2020   Chest pressure 01/26/2020   Bilateral lower extremity edema 12/09/2019   Chronic left shoulder pain 12/09/2019   Generalized anxiety disorder 05/15/2019   Long-term use of aspirin therapy 01/30/2019   Diabetes mellitus, type 2 ([place]) 11/19/2018   Hyperlipemia 11/19/2018   Hypertension 11/19/2018   Sleep apnea 11/19/2018    Past Surgical History:  Procedure Laterality Date   carpel tunnel     COLONOSCOPY WITH PROPOFOL N/A 06/21/2015   Procedure: COLONOSCOPY WITH PROPOFOL;  Surgeon: Lollie Sails, MD;  Location: [facility] ENDOSCOPY;  Service: Endoscopy;  Laterality: N/A;   COLONOSCOPY WITH PROPOFOL N/A 07/20/2015   Procedure: COLONOSCOPY WITH PROPOFOL;  Surgeon: Lollie Sails, MD;  Location: [facility] ENDOSCOPY;  Service: Endoscopy;  Laterality: N/A;   WISDOM TOOTH EXTRACTION      Prior to Admission medications   Medication Sig Start Date End Date Taking? Authorizing Provider  amLODipine (NORVASC) 10 MG tablet Take 1 tablet (10 mg total) by mouth daily. 06/29/20   Lesleigh Noe, MD  ascorbic acid (VITAMIN C) 500 MG tablet Take 1 tablet (500 mg total) by mouth daily. 01/03/21   Samuella Cota, MD  aspirin EC 81 MG tablet Take 81 mg by mouth daily.    [provider]  bisoprolol-hydrochlorothiazide (ZIAC) 5-6.25 MG tablet TAKE TWO TABLETS EVERY DAY Patient taking differently: Take 1 tablet by mouth daily.  07/07/20   Lesleigh Noe, MD  Blood Glucose Monitoring Suppl (BLOOD GLUCOSE MONITOR SYSTEM) w/Device KIT Use to test blood sugar once daily 04/06/20   Laqueta Linden, MD  fenofibrate (TRICOR) 145 MG tablet Take 1 tablet (145 mg total) by mouth daily. 01/26/20   Lesleigh Noe, MD  glucose blood (CONTOUR NEXT TEST) test strip Check sugar once daily. DX E11.9 02/05/20   Lesleigh Noe, MD  Lancets MISC 1 each by Does not apply route daily as needed. 12/09/19   Lesleigh Noe, MD  levocetirizine (XYZAL)  5 MG tablet Take 5 mg by mouth every evening.    [provider]  metFORMIN (GLUCOPHAGE) 1000 MG tablet Take 1 tablet (1,000 mg total) by mouth 2 (two) times daily. 11/29/20   Lesleigh Noe, MD  NON FORMULARY CPAP MACHINE USING EVERY NIGHT setting at 10    [provider]  PARoxetine (PAXIL) 20 MG tablet TAKE 3 TABLETS BY MOUTH ONCE DAILY Patient taking differently: Take 60 mg by mouth daily. 07/08/20   Lesleigh Noe, MD  predniSONE (DELTASONE) 50 MG tablet Take 1 tablet (50 mg total) by mouth daily. 01/03/21   Samuella Cota, MD  Semaglutide,0.25 or 0.5MG/DOS, (OZEMPIC, 0.25 OR 0.5 MG/DOSE,) 2 MG/1.5ML SOPN INJECT 0.5 MG INTO THE SKIN ONCE A WEEK Patient taking differently: Inject 0.25 mg into the skin once a week. INJECT 0.5 MG INTO THE SKIN ONCE A WEEK 06/07/20   Lesleigh Noe, MD  Vitamin D, Ergocalciferol, (DRISDOL) 1.25 MG (50000 UNIT) CAPS capsule Take 1 capsule (50,000 Units total) by mouth every 7 (seven) days. 04/06/20   Laqueta Linden, MD  zinc sulfate 220 (50 Zn) MG capsule Take 1 capsule (220 mg total) by mouth daily. 01/03/21   Samuella Cota, MD    Allergies Codeine and Simvastatin  Family History  Problem Relation Age of Onset   Prostate cancer Father 90   Hyperlipidemia Father    Cancer Maternal Grandmother    Heart attack Maternal Grandfather    Stroke Paternal 37        old   Stomach cancer Paternal Grandfather     Social History Social History   Tobacco Use   Smoking status: Former    Packs/day: 1.00    Years: 8.00    Pack years: 8.00    Types: Cigarettes    Quit date: 04/24/2018    Years since quitting: 3.2   Smokeless tobacco: Former    Types: Chew    Quit date: 11/19/2013  Vaping Use   Vaping Use: Never used  Substance Use Topics   Alcohol use: Yes    Comment: not regularly currently   Drug use: No    Review of Systems  Constitutional: Negative for fever. + cramps Eyes: Negative for visual changes. ENT:  Negative for sore throat. Neck: No neck pain  Cardiovascular: + chest pain. Respiratory: Negative for shortness of breath. Gastrointestinal: Negative for abdominal pain, vomiting or diarrhea. Genitourinary: Negative for dysuria. Musculoskeletal: Negative for back pain. Skin: Negative for rash. Neurological: Negative for headaches, weakness or numbness. Psych: No SI or HI  ____________________________________________   PHYSICAL EXAM:  VITAL SIGNS: ED Triage Vitals  Enc Vitals Group     BP 07/05/21 0007 117/71     Pulse Rate 07/05/21 0007 (!) 55     Resp 07/05/21 0007 16     Temp 07/05/21 0007 98.1 F (36.7 C)     Temp Source 07/05/21 0007  Oral     SpO2 07/05/21 0007 92 %     Weight 07/05/21 0008 240 lb 1.3 oz (108.9 kg)     Height 07/05/21 0008 '5\' 10"'  (1.778 m)     Head Circumference --      Peak Flow --      Pain Score --      Pain Loc --      Pain Edu? --      Excl. in [place]? --     Constitutional: Alert and oriented. Well appearing and in no apparent distress. HEENT:      Head: Normocephalic and atraumatic.         Eyes: Conjunctivae are normal. Sclera is non-icteric.       Mouth/Throat: Mucous membranes are moist.       Neck: Supple with no signs of meningismus. Cardiovascular: Regular rate and rhythm. No murmurs, gallops, or rubs. 2+ symmetrical distal pulses are present in all extremities. No JVD. Respiratory: Normal respiratory effort. Lungs are clear to auscultation bilaterally.  Gastrointestinal: Soft, non tender, and non distended with positive bowel sounds. No rebound or guarding. Genitourinary: No CVA tenderness. Musculoskeletal:  No edema, cyanosis, or erythema of extremities. Neurologic: Normal speech and language. Face is symmetric. Moving all extremities. No gross focal neurologic deficits are appreciated. Skin: Skin is warm, dry and intact. No rash noted. Psychiatric: Mood and affect are normal. Speech and behavior are  normal.  ____________________________________________   LABS (all labs ordered are listed, but only abnormal results are displayed)  Labs Reviewed  CBC - Abnormal; Notable for the following components:      Result Value   RBC 3.83 (*)    Hemoglobin 12.2 (*)    HCT 33.2 (*)    MCHC 36.7 (*)    nRBC 0.5 (*)    All other components within normal limits  COMPREHENSIVE METABOLIC PANEL - Abnormal; Notable for the following components:   Sodium 133 (*)    Chloride 96 (*)    Glucose, Bld 333 (*)    Calcium 8.6 (*)    Total Protein 6.2 (*)    Total Bilirubin 1.3 (*)    All other components within normal limits  CBG MONITORING, ED - Abnormal; Notable for the following components:   Glucose-Capillary 240 (*)    All other components within normal limits  RESP PANEL BY RT-PCR (FLU A&B, COVID) ARPGX2  TROPONIN I (HIGH SENSITIVITY)  TROPONIN I (HIGH SENSITIVITY)   ____________________________________________  EKG  ED ECG REPORT I, Rudene Re, the attending physician, personally viewed and interpreted this ECG.  Sinus rhythm, rate of 55, normal intervals, diffuse T wave flattening no ST elevations or depressions.  No significant changes when compared to prior from January 2022 ____________________________________________  RADIOLOGY  I have personally reviewed the images performed during this visit and I agree with the Radiologist's read.   Interpretation by Radiologist:  DG Chest 2 View  Result Date: 07/05/2021 CLINICAL DATA:  Chest pain EXAM: CHEST - 2 VIEW COMPARISON:  Radiograph 12/28/2020, 07/07/2018 FINDINGS: Interval clearing of previously seen ill-defined opacities in the lung bases. Some more chronically coarsened interstitial changes are similar to priors. No pneumothorax or visible effusion. No focal consolidative opacity. The cardiomediastinal contours are unremarkable. No acute osseous or soft tissue abnormality. Degenerative changes are present in the imaged spine  and shoulders. Telemetry leads overlie the chest. IMPRESSION: No acute cardiopulmonary abnormality. Electronically Signed   By: Lovena Le M.D.   On: 07/05/2021 00:43  ____________________________________________   PROCEDURES  Procedure(s) performed: yes .1-3 Lead EKG Interpretation  Date/Time: 07/05/2021 4:35 AM Performed by: Rudene Re, MD Authorized by: Rudene Re, MD     Interpretation: non-specific     ECG rate assessment: bradycardic     Rhythm: sinus bradycardia     Ectopy: none     Conduction: normal    Critical Care performed:  None ____________________________________________   INITIAL IMPRESSION / ASSESSMENT AND PLAN / ED COURSE  60 y.o. male with a history of GERD, diabetes, hypertension, hyperlipidemia who presents for evaluation of chest pain.  Patient with an episode of short-lived chest pain and diffuse body cramps after some dietary indiscretions today.  Also complaining of indigestion which she describes as burning sensation in his chest for the last few days.  Patient is otherwise well-appearing in no distress with normal vital signs.  EKG is unchanged from baseline.  Patient has no tachycardia, no tachypnea, no hypoxia, normal work of breathing and normal sats.  Lungs are clear to auscultation.  Initially arrived on 2 L nasal cannula however that was discontinued immediately and patient sats remained 95 to 100%.  Differential diagnoses including GERD/indigestion versus ACS versus DKA versus dehydration.  Patient given aspirin per EMS  Low suspicion for PE with no tachypnea, tachycardia, hypoxia.  Low suspicion for dissection with normal mediastinum silhouette on chest x-ray, no tachycardia, no hypertension, no pain radiating to the back, no neurological deficits.  Chest x-ray visualized by me with no acute findings, confirmed by radiology.  2 high-sensitivity troponins were negative with no signs of ischemia.  Blood work showing glucose of 333  with no signs of DKA.  After liter bolus patient's repeat blood glucose of 240.  COVID and flu negative.  No signs of significant dehydration, sepsis, AKI.  During patient's entire stay in the emergency room which was almost 5 hours patient remained with no further episodes of chest pain.  At this time with negative work-up and no symptoms patient is stable for discharge home.  Due to his history of diabetes, hypertension, hyperlipidemia will refer patient to cardiology.  Discussed my standard return precautions and follow-up.  Plan was discussed with patient and his wife was at bedside.      _____________________________________________ Please note:  Patient was evaluated in Emergency Department today for the symptoms described in the history of present illness. Patient was evaluated in the context of the global COVID-19 pandemic, which necessitated consideration that the patient might be at risk for infection with the SARS-CoV-2 virus that causes COVID-19. Institutional protocols and algorithms that pertain to the evaluation of patients at risk for COVID-19 are in a state of rapid change based on information released by regulatory bodies including the CDC and federal and state organizations. These policies and algorithms were followed during the patient's care in the ED.  Some ED evaluations and interventions may be delayed as a result of limited staffing during the pandemic.   [place] Controlled Substance Database was reviewed by me. ____________________________________________   FINAL CLINICAL IMPRESSION(S) / ED DIAGNOSES   Final diagnoses:  Atypical chest pain  Type 2 diabetes mellitus with hyperglycemia, unspecified whether long term insulin use ([place])      NEW MEDICATIONS STARTED DURING THIS VISIT:  ED Discharge Orders     None        Note:  This document was prepared using Dragon voice recognition software and may include unintentional dictation errors.    Alfred Levins, [place],  MD 07/05/21 [phone]

## 2021-07-05 NOTE — Discharge Instructions (Addendum)

## 2021-07-13 DIAGNOSIS — K219 Gastro-esophageal reflux disease without esophagitis: Secondary | ICD-10-CM | POA: Diagnosis not present

## 2021-07-13 DIAGNOSIS — I10 Essential (primary) hypertension: Secondary | ICD-10-CM | POA: Diagnosis not present

## 2021-07-13 DIAGNOSIS — R0789 Other chest pain: Secondary | ICD-10-CM | POA: Diagnosis not present

## 2021-07-18 DIAGNOSIS — G4733 Obstructive sleep apnea (adult) (pediatric): Secondary | ICD-10-CM | POA: Diagnosis not present

## 2021-08-18 DIAGNOSIS — G4733 Obstructive sleep apnea (adult) (pediatric): Secondary | ICD-10-CM | POA: Diagnosis not present

## 2021-08-23 DIAGNOSIS — E113393 Type 2 diabetes mellitus with moderate nonproliferative diabetic retinopathy without macular edema, bilateral: Secondary | ICD-10-CM | POA: Diagnosis not present

## 2021-08-26 ENCOUNTER — Other Ambulatory Visit: Payer: Self-pay | Admitting: *Deleted

## 2021-08-26 NOTE — Patient Outreach (Signed)
[facility]) Care Management  08/26/2021  Jaceion Aday 1961/05/07 [id number]   Telephone Assessment-Unsuccessful-HTN  RN attempted outreach call today however unsuccessful. RN able to leave a HIPAA approved voice message requesting a call back.  Will outreach once again over the next week for ongoing [facility] services.  [name], RN Care Management Coordinator [facility] Main Office [phone]

## 2021-08-31 ENCOUNTER — Other Ambulatory Visit: Payer: Self-pay | Admitting: *Deleted

## 2021-08-31 NOTE — Patient Outreach (Addendum)
[facility]) Care Management  08/31/2021  Kevin Reid [birth year] [id number]  Telephone Assessment-Unsuccessful Outreach #2  RN attempted outreach call today however unsuccessful. RN was not able to leave a HIPAA voice message to both the home and mobile contact requesting a call back.  Will follow engage at that time and completed another outreach call over the next week and send outreach letter.   [name], RN Care Management Coordinator [facility] Main Office [phone]

## 2021-09-06 ENCOUNTER — Other Ambulatory Visit: Payer: Self-pay | Admitting: *Deleted

## 2021-09-06 NOTE — Patient Outreach (Signed)
[facility]) Care Management  09/06/2021  Kevin Reid August 29, 1961 [id number]   Telephone Assessment Outreach #3  RN attempted outreach call today however unsuccessful. RN able to leave a message on pt's spouse's cell requesting a call back.  Will follow up once again in a few weeks if not sooner. Note no response to outreach letter sent earlier this month.   [name], RN Care Management Coordinator [facility] Main Office [phone]

## 2021-09-15 DIAGNOSIS — G4733 Obstructive sleep apnea (adult) (pediatric): Secondary | ICD-10-CM | POA: Diagnosis not present

## 2021-09-18 DIAGNOSIS — G4733 Obstructive sleep apnea (adult) (pediatric): Secondary | ICD-10-CM | POA: Diagnosis not present

## 2021-10-06 ENCOUNTER — Other Ambulatory Visit: Payer: Self-pay | Admitting: *Deleted

## 2021-10-06 NOTE — Patient Outreach (Signed)
[facility]) Care Management  10/06/2021  Kevin Reid 15-Sep-1961 [id number]   Telephone Assessment-Unsuccessful  RN attempted outreach call however unsuccessful. RN was able to leave a HIPAA approved voice message requesting a call back.  Will attempted another outreach over the next few weeks for ongoing [name] services.  [name], RN Care Management Coordinator [facility] Main Office [phone]

## 2021-10-15 DIAGNOSIS — G4733 Obstructive sleep apnea (adult) (pediatric): Secondary | ICD-10-CM | POA: Diagnosis not present

## 2021-10-18 DIAGNOSIS — G4733 Obstructive sleep apnea (adult) (pediatric): Secondary | ICD-10-CM | POA: Diagnosis not present

## 2021-10-21 ENCOUNTER — Other Ambulatory Visit: Payer: Self-pay | Admitting: *Deleted

## 2021-10-21 NOTE — Patient Outreach (Signed)
[facility]) Care Management  10/21/2021  Kevin Reid July 15, 1961 [id number]   Case Closure Outreach #5  Several outreach call made to pt however unsuccessful. Attempted last outreach call today and left a HIPAA approved voice message requesting a call back.  Will close case with no response and notify the provider.  [name], RN Care Management Coordinator [facility] Main Office [phone]

## 2021-10-27 DIAGNOSIS — M7542 Impingement syndrome of left shoulder: Secondary | ICD-10-CM | POA: Diagnosis not present

## 2021-10-27 DIAGNOSIS — M75122 Complete rotator cuff tear or rupture of left shoulder, not specified as traumatic: Secondary | ICD-10-CM | POA: Diagnosis not present

## 2021-10-27 DIAGNOSIS — M24512 Contracture, left shoulder: Secondary | ICD-10-CM | POA: Diagnosis not present

## 2021-10-27 DIAGNOSIS — Z7984 Long term (current) use of oral hypoglycemic drugs: Secondary | ICD-10-CM | POA: Diagnosis not present

## 2021-10-27 DIAGNOSIS — M25512 Pain in left shoulder: Secondary | ICD-10-CM | POA: Diagnosis not present

## 2021-10-27 DIAGNOSIS — M75112 Incomplete rotator cuff tear or rupture of left shoulder, not specified as traumatic: Secondary | ICD-10-CM | POA: Diagnosis not present

## 2021-10-27 DIAGNOSIS — Z7985 Long-term (current) use of injectable non-insulin antidiabetic drugs: Secondary | ICD-10-CM | POA: Diagnosis not present

## 2021-10-27 DIAGNOSIS — S46212A Strain of muscle, fascia and tendon of other parts of biceps, left arm, initial encounter: Secondary | ICD-10-CM | POA: Diagnosis not present

## 2021-10-27 DIAGNOSIS — M7522 Bicipital tendinitis, left shoulder: Secondary | ICD-10-CM | POA: Diagnosis not present

## 2021-10-27 DIAGNOSIS — G8918 Other acute postprocedural pain: Secondary | ICD-10-CM | POA: Diagnosis not present

## 2021-10-27 DIAGNOSIS — M19012 Primary osteoarthritis, left shoulder: Secondary | ICD-10-CM | POA: Diagnosis not present

## 2021-11-15 DIAGNOSIS — G4733 Obstructive sleep apnea (adult) (pediatric): Secondary | ICD-10-CM | POA: Diagnosis not present

## 2021-11-18 DIAGNOSIS — G4733 Obstructive sleep apnea (adult) (pediatric): Secondary | ICD-10-CM | POA: Diagnosis not present

## 2021-12-05 DIAGNOSIS — M75122 Complete rotator cuff tear or rupture of left shoulder, not specified as traumatic: Secondary | ICD-10-CM | POA: Diagnosis not present

## 2021-12-05 DIAGNOSIS — M75120 Complete rotator cuff tear or rupture of unspecified shoulder, not specified as traumatic: Secondary | ICD-10-CM | POA: Insufficient documentation

## 2021-12-06 DIAGNOSIS — J0100 Acute maxillary sinusitis, unspecified: Secondary | ICD-10-CM | POA: Diagnosis not present

## 2021-12-14 DIAGNOSIS — M75122 Complete rotator cuff tear or rupture of left shoulder, not specified as traumatic: Secondary | ICD-10-CM | POA: Diagnosis not present

## 2021-12-14 DIAGNOSIS — G4733 Obstructive sleep apnea (adult) (pediatric): Secondary | ICD-10-CM | POA: Diagnosis not present

## 2021-12-15 DIAGNOSIS — Z20822 Contact with and (suspected) exposure to covid-19: Secondary | ICD-10-CM | POA: Diagnosis not present

## 2021-12-21 DIAGNOSIS — M75122 Complete rotator cuff tear or rupture of left shoulder, not specified as traumatic: Secondary | ICD-10-CM | POA: Diagnosis not present

## 2021-12-30 DIAGNOSIS — M75122 Complete rotator cuff tear or rupture of left shoulder, not specified as traumatic: Secondary | ICD-10-CM | POA: Diagnosis not present

## 2022-01-06 DIAGNOSIS — M7552 Bursitis of left shoulder: Secondary | ICD-10-CM | POA: Diagnosis not present

## 2022-01-10 DIAGNOSIS — M75122 Complete rotator cuff tear or rupture of left shoulder, not specified as traumatic: Secondary | ICD-10-CM | POA: Diagnosis not present

## 2022-01-12 DIAGNOSIS — S46119A Strain of muscle, fascia and tendon of long head of biceps, unspecified arm, initial encounter: Secondary | ICD-10-CM | POA: Insufficient documentation

## 2022-01-14 DIAGNOSIS — G4733 Obstructive sleep apnea (adult) (pediatric): Secondary | ICD-10-CM | POA: Diagnosis not present

## 2022-01-16 DIAGNOSIS — M75122 Complete rotator cuff tear or rupture of left shoulder, not specified as traumatic: Secondary | ICD-10-CM | POA: Diagnosis not present

## 2022-01-19 DIAGNOSIS — Z20822 Contact with and (suspected) exposure to covid-19: Secondary | ICD-10-CM | POA: Diagnosis not present

## 2022-01-19 DIAGNOSIS — M75122 Complete rotator cuff tear or rupture of left shoulder, not specified as traumatic: Secondary | ICD-10-CM | POA: Diagnosis not present

## 2022-01-25 DIAGNOSIS — S46112A Strain of muscle, fascia and tendon of long head of biceps, left arm, initial encounter: Secondary | ICD-10-CM | POA: Diagnosis not present

## 2022-01-30 DIAGNOSIS — M75120 Complete rotator cuff tear or rupture of unspecified shoulder, not specified as traumatic: Secondary | ICD-10-CM | POA: Diagnosis not present

## 2022-01-30 DIAGNOSIS — M19012 Primary osteoarthritis, left shoulder: Secondary | ICD-10-CM | POA: Diagnosis not present

## 2022-01-30 DIAGNOSIS — M75122 Complete rotator cuff tear or rupture of left shoulder, not specified as traumatic: Secondary | ICD-10-CM | POA: Diagnosis not present

## 2022-01-30 DIAGNOSIS — M19019 Primary osteoarthritis, unspecified shoulder: Secondary | ICD-10-CM | POA: Diagnosis not present

## 2022-02-14 DIAGNOSIS — G4733 Obstructive sleep apnea (adult) (pediatric): Secondary | ICD-10-CM | POA: Diagnosis not present

## 2022-03-07 DIAGNOSIS — Z20822 Contact with and (suspected) exposure to covid-19: Secondary | ICD-10-CM | POA: Diagnosis not present

## 2022-03-09 DIAGNOSIS — Z01812 Encounter for preprocedural laboratory examination: Secondary | ICD-10-CM | POA: Diagnosis not present

## 2022-03-14 DIAGNOSIS — J309 Allergic rhinitis, unspecified: Secondary | ICD-10-CM | POA: Diagnosis not present

## 2022-03-14 DIAGNOSIS — E785 Hyperlipidemia, unspecified: Secondary | ICD-10-CM | POA: Diagnosis not present

## 2022-03-14 DIAGNOSIS — E781 Pure hyperglyceridemia: Secondary | ICD-10-CM | POA: Diagnosis not present

## 2022-03-14 DIAGNOSIS — M25512 Pain in left shoulder: Secondary | ICD-10-CM | POA: Diagnosis not present

## 2022-03-14 DIAGNOSIS — G8918 Other acute postprocedural pain: Secondary | ICD-10-CM | POA: Diagnosis not present

## 2022-03-14 DIAGNOSIS — M19012 Primary osteoarthritis, left shoulder: Secondary | ICD-10-CM | POA: Diagnosis not present

## 2022-03-14 DIAGNOSIS — Z7985 Long-term (current) use of injectable non-insulin antidiabetic drugs: Secondary | ICD-10-CM | POA: Diagnosis not present

## 2022-03-14 DIAGNOSIS — I10 Essential (primary) hypertension: Secondary | ICD-10-CM | POA: Diagnosis not present

## 2022-03-14 DIAGNOSIS — F32A Depression, unspecified: Secondary | ICD-10-CM | POA: Diagnosis not present

## 2022-03-14 DIAGNOSIS — M12512 Traumatic arthropathy, left shoulder: Secondary | ICD-10-CM | POA: Diagnosis not present

## 2022-03-14 DIAGNOSIS — E119 Type 2 diabetes mellitus without complications: Secondary | ICD-10-CM | POA: Diagnosis not present

## 2022-03-14 DIAGNOSIS — G4733 Obstructive sleep apnea (adult) (pediatric): Secondary | ICD-10-CM | POA: Diagnosis not present

## 2022-03-14 DIAGNOSIS — M75120 Complete rotator cuff tear or rupture of unspecified shoulder, not specified as traumatic: Secondary | ICD-10-CM | POA: Diagnosis not present

## 2022-03-14 DIAGNOSIS — M24512 Contracture, left shoulder: Secondary | ICD-10-CM | POA: Diagnosis not present

## 2022-03-14 DIAGNOSIS — Z7984 Long term (current) use of oral hypoglycemic drugs: Secondary | ICD-10-CM | POA: Diagnosis not present

## 2022-03-15 DIAGNOSIS — J309 Allergic rhinitis, unspecified: Secondary | ICD-10-CM | POA: Diagnosis not present

## 2022-03-15 DIAGNOSIS — Z7985 Long-term (current) use of injectable non-insulin antidiabetic drugs: Secondary | ICD-10-CM | POA: Diagnosis not present

## 2022-03-15 DIAGNOSIS — M19012 Primary osteoarthritis, left shoulder: Secondary | ICD-10-CM | POA: Diagnosis not present

## 2022-03-15 DIAGNOSIS — E781 Pure hyperglyceridemia: Secondary | ICD-10-CM | POA: Diagnosis not present

## 2022-03-15 DIAGNOSIS — F32A Depression, unspecified: Secondary | ICD-10-CM | POA: Diagnosis not present

## 2022-03-15 DIAGNOSIS — E119 Type 2 diabetes mellitus without complications: Secondary | ICD-10-CM | POA: Diagnosis not present

## 2022-03-15 DIAGNOSIS — I10 Essential (primary) hypertension: Secondary | ICD-10-CM | POA: Diagnosis not present

## 2022-03-15 DIAGNOSIS — G4733 Obstructive sleep apnea (adult) (pediatric): Secondary | ICD-10-CM | POA: Diagnosis not present

## 2022-03-15 DIAGNOSIS — Z7984 Long term (current) use of oral hypoglycemic drugs: Secondary | ICD-10-CM | POA: Diagnosis not present

## 2022-03-27 DIAGNOSIS — M25512 Pain in left shoulder: Secondary | ICD-10-CM | POA: Diagnosis not present

## 2022-03-29 DIAGNOSIS — Z96612 Presence of left artificial shoulder joint: Secondary | ICD-10-CM | POA: Diagnosis not present

## 2022-03-31 DIAGNOSIS — Z96612 Presence of left artificial shoulder joint: Secondary | ICD-10-CM | POA: Diagnosis not present

## 2022-04-03 DIAGNOSIS — Z96612 Presence of left artificial shoulder joint: Secondary | ICD-10-CM | POA: Diagnosis not present

## 2022-04-05 DIAGNOSIS — Z96612 Presence of left artificial shoulder joint: Secondary | ICD-10-CM | POA: Diagnosis not present

## 2022-04-07 DIAGNOSIS — Z20822 Contact with and (suspected) exposure to covid-19: Secondary | ICD-10-CM | POA: Diagnosis not present

## 2022-04-11 DIAGNOSIS — Z96612 Presence of left artificial shoulder joint: Secondary | ICD-10-CM | POA: Diagnosis not present

## 2022-04-13 DIAGNOSIS — E782 Mixed hyperlipidemia: Secondary | ICD-10-CM | POA: Diagnosis not present

## 2022-04-13 DIAGNOSIS — Z96612 Presence of left artificial shoulder joint: Secondary | ICD-10-CM | POA: Diagnosis not present

## 2022-04-13 DIAGNOSIS — Z79899 Other long term (current) drug therapy: Secondary | ICD-10-CM | POA: Diagnosis not present

## 2022-04-13 DIAGNOSIS — E119 Type 2 diabetes mellitus without complications: Secondary | ICD-10-CM | POA: Diagnosis not present

## 2022-04-13 DIAGNOSIS — Z125 Encounter for screening for malignant neoplasm of prostate: Secondary | ICD-10-CM | POA: Diagnosis not present

## 2022-04-15 DIAGNOSIS — G4731 Primary central sleep apnea: Secondary | ICD-10-CM | POA: Diagnosis not present

## 2022-04-15 DIAGNOSIS — G4733 Obstructive sleep apnea (adult) (pediatric): Secondary | ICD-10-CM | POA: Diagnosis not present

## 2022-04-18 DIAGNOSIS — Z96612 Presence of left artificial shoulder joint: Secondary | ICD-10-CM | POA: Diagnosis not present

## 2022-04-20 DIAGNOSIS — E7800 Pure hypercholesterolemia, unspecified: Secondary | ICD-10-CM | POA: Diagnosis not present

## 2022-04-20 DIAGNOSIS — Z0000 Encounter for general adult medical examination without abnormal findings: Secondary | ICD-10-CM | POA: Diagnosis not present

## 2022-04-20 DIAGNOSIS — G4733 Obstructive sleep apnea (adult) (pediatric): Secondary | ICD-10-CM | POA: Diagnosis not present

## 2022-04-20 DIAGNOSIS — Z1331 Encounter for screening for depression: Secondary | ICD-10-CM | POA: Diagnosis not present

## 2022-04-20 DIAGNOSIS — E1159 Type 2 diabetes mellitus with other circulatory complications: Secondary | ICD-10-CM | POA: Diagnosis not present

## 2022-04-25 DIAGNOSIS — Z96612 Presence of left artificial shoulder joint: Secondary | ICD-10-CM | POA: Diagnosis not present

## 2022-04-27 DIAGNOSIS — Z96612 Presence of left artificial shoulder joint: Secondary | ICD-10-CM | POA: Diagnosis not present

## 2022-05-04 DIAGNOSIS — Z96612 Presence of left artificial shoulder joint: Secondary | ICD-10-CM | POA: Diagnosis not present

## 2022-05-15 DIAGNOSIS — G4733 Obstructive sleep apnea (adult) (pediatric): Secondary | ICD-10-CM | POA: Diagnosis not present

## 2022-05-17 DIAGNOSIS — Z96612 Presence of left artificial shoulder joint: Secondary | ICD-10-CM | POA: Diagnosis not present

## 2022-05-24 DIAGNOSIS — Z96612 Presence of left artificial shoulder joint: Secondary | ICD-10-CM | POA: Diagnosis not present

## 2022-06-01 IMAGING — DX DG CHEST 1V PORT
1 series · 1 of 1 positions shown · non-contrast
Comparison: 07/07/2018

CLINICAL DATA: COVID cough short of breath

EXAM:
PORTABLE CHEST 1 VIEW

[chest ap]
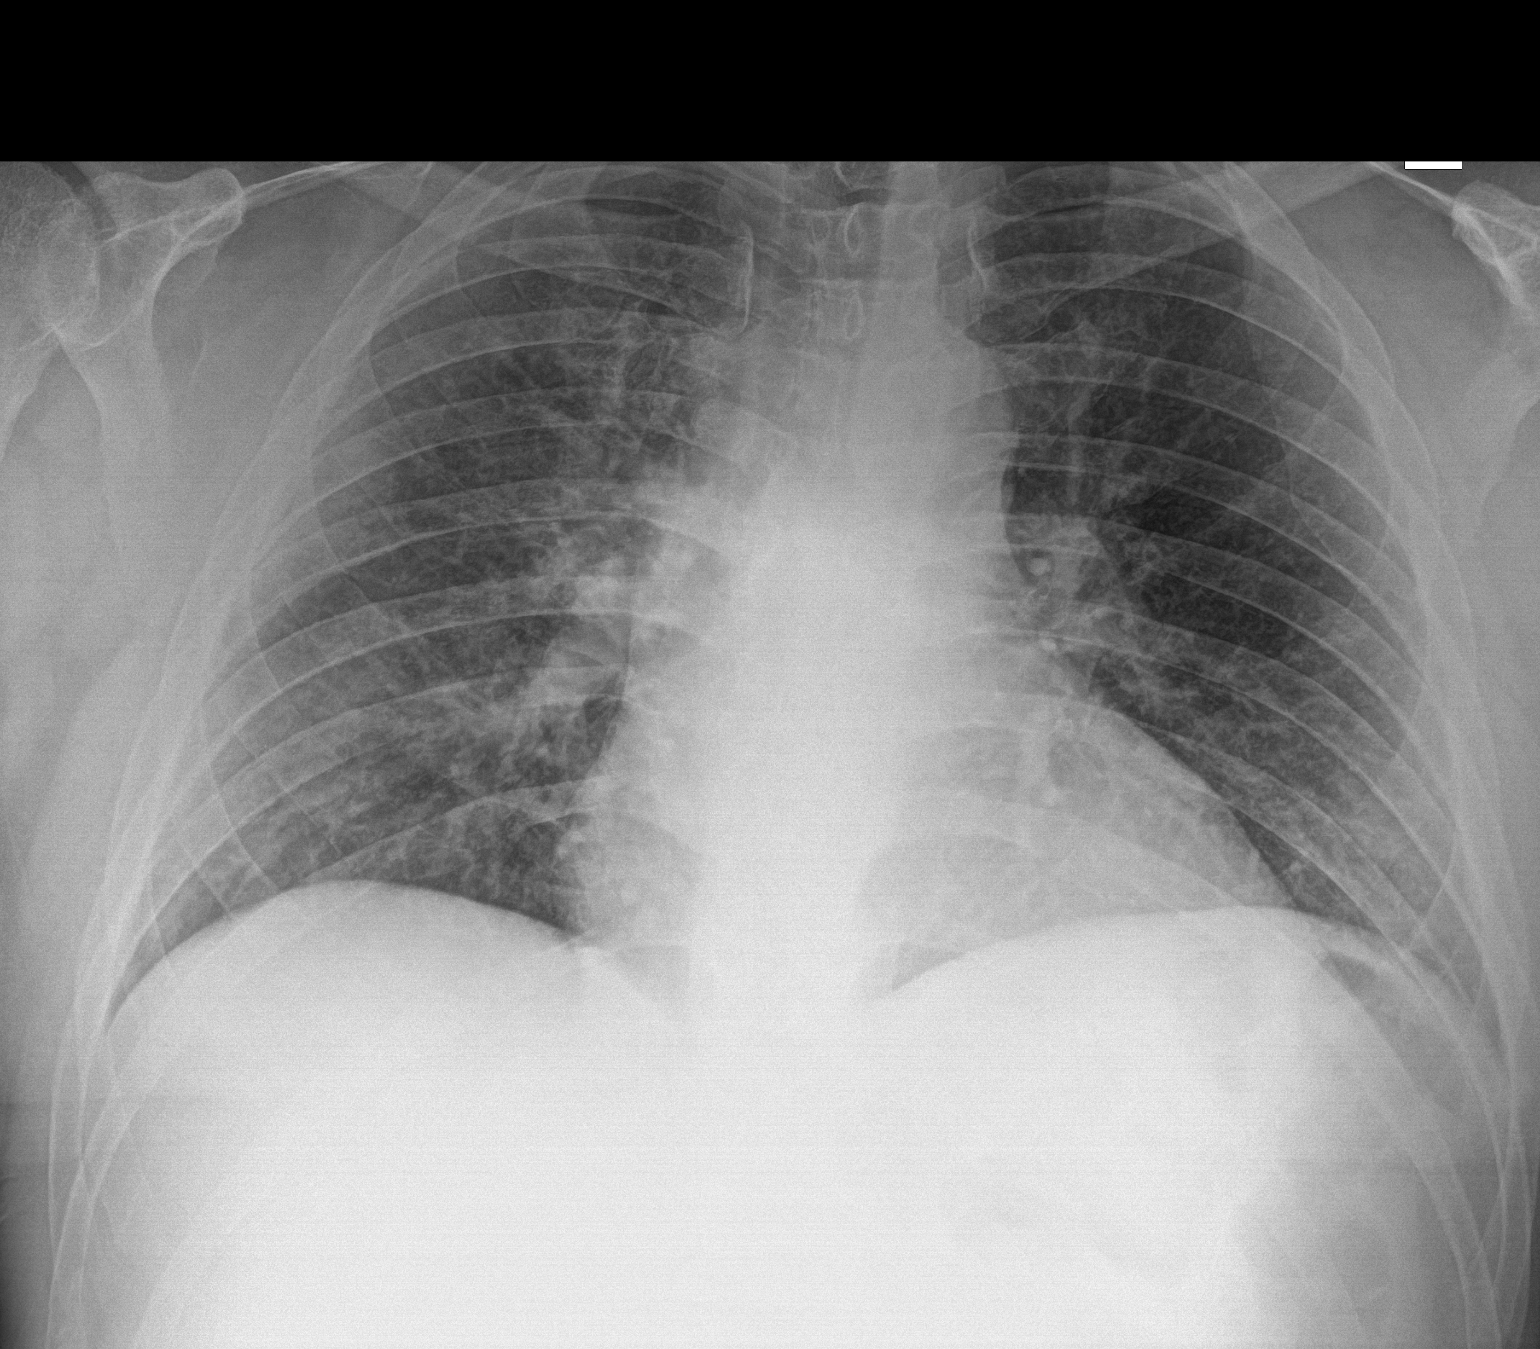

[1 of 1 positions shown; findings below may reference images not displayed]

FINDINGS: Subtle ground-glass opacities in the left lower lung and in the
right thorax. No pleural effusion. Normal heart size. No
pneumothorax
IMPRESSION: Suspicion of subtle ground-glass opacities/pneumonia in the left
lower lung and right thorax.

## 2022-06-19 DIAGNOSIS — G4733 Obstructive sleep apnea (adult) (pediatric): Secondary | ICD-10-CM | POA: Diagnosis not present

## 2022-06-19 DIAGNOSIS — G4731 Primary central sleep apnea: Secondary | ICD-10-CM | POA: Diagnosis not present

## 2022-07-17 DIAGNOSIS — G4731 Primary central sleep apnea: Secondary | ICD-10-CM | POA: Diagnosis not present

## 2022-07-17 DIAGNOSIS — G4733 Obstructive sleep apnea (adult) (pediatric): Secondary | ICD-10-CM | POA: Diagnosis not present

## 2022-07-19 DIAGNOSIS — G4733 Obstructive sleep apnea (adult) (pediatric): Secondary | ICD-10-CM | POA: Diagnosis not present

## 2022-07-19 DIAGNOSIS — G4731 Primary central sleep apnea: Secondary | ICD-10-CM | POA: Diagnosis not present

## 2022-08-02 ENCOUNTER — Encounter (INDEPENDENT_AMBULATORY_CARE_PROVIDER_SITE_OTHER): Payer: Self-pay

## 2022-08-15 DIAGNOSIS — M222X1 Patellofemoral disorders, right knee: Secondary | ICD-10-CM | POA: Diagnosis not present

## 2022-08-15 DIAGNOSIS — M222X2 Patellofemoral disorders, left knee: Secondary | ICD-10-CM | POA: Diagnosis not present

## 2022-08-19 DIAGNOSIS — G4731 Primary central sleep apnea: Secondary | ICD-10-CM | POA: Diagnosis not present

## 2022-10-13 DIAGNOSIS — J320 Chronic maxillary sinusitis: Secondary | ICD-10-CM | POA: Diagnosis not present

## 2022-10-13 DIAGNOSIS — J302 Other seasonal allergic rhinitis: Secondary | ICD-10-CM | POA: Diagnosis not present

## 2022-10-13 DIAGNOSIS — J339 Nasal polyp, unspecified: Secondary | ICD-10-CM | POA: Diagnosis not present

## 2022-10-18 DIAGNOSIS — J330 Polyp of nasal cavity: Secondary | ICD-10-CM | POA: Diagnosis not present

## 2022-10-31 DIAGNOSIS — J3489 Other specified disorders of nose and nasal sinuses: Secondary | ICD-10-CM | POA: Diagnosis not present

## 2022-10-31 DIAGNOSIS — J342 Deviated nasal septum: Secondary | ICD-10-CM | POA: Diagnosis not present

## 2022-10-31 DIAGNOSIS — G4733 Obstructive sleep apnea (adult) (pediatric): Secondary | ICD-10-CM | POA: Diagnosis not present

## 2022-10-31 DIAGNOSIS — J301 Allergic rhinitis due to pollen: Secondary | ICD-10-CM | POA: Diagnosis not present

## 2022-11-09 DIAGNOSIS — J301 Allergic rhinitis due to pollen: Secondary | ICD-10-CM | POA: Diagnosis not present

## 2022-11-16 DIAGNOSIS — G4731 Primary central sleep apnea: Secondary | ICD-10-CM | POA: Diagnosis not present

## 2022-12-16 DIAGNOSIS — G4733 Obstructive sleep apnea (adult) (pediatric): Secondary | ICD-10-CM | POA: Diagnosis not present

## 2023-01-17 DIAGNOSIS — G4733 Obstructive sleep apnea (adult) (pediatric): Secondary | ICD-10-CM | POA: Diagnosis not present

## 2023-02-01 DIAGNOSIS — U071 COVID-19: Secondary | ICD-10-CM | POA: Diagnosis not present

## 2023-02-17 DIAGNOSIS — G4731 Primary central sleep apnea: Secondary | ICD-10-CM | POA: Diagnosis not present

## 2023-02-17 DIAGNOSIS — G4733 Obstructive sleep apnea (adult) (pediatric): Secondary | ICD-10-CM | POA: Diagnosis not present

## 2023-03-13 ENCOUNTER — Ambulatory Visit: Payer: Self-pay | Admitting: Physician Assistant

## 2023-03-13 ENCOUNTER — Encounter: Payer: Self-pay | Admitting: Physician Assistant

## 2023-03-13 VITALS — HR 63 | Temp 97.5°F | Resp 14 | Wt 228.0 lb

## 2023-03-13 DIAGNOSIS — L089 Local infection of the skin and subcutaneous tissue, unspecified: Secondary | ICD-10-CM

## 2023-03-13 MED ORDER — SULFAMETHOXAZOLE-TRIMETHOPRIM 800-160 MG PO TABS: 1.0000 | ORAL_TABLET | Freq: Two times a day (BID) | ORAL | 0 refills | Status: DC

## 2023-03-13 MED ORDER — OXYCODONE-ACETAMINOPHEN 7.5-325 MG PO TABS: 1.0000 | ORAL_TABLET | Freq: Four times a day (QID) | ORAL | 0 refills | Status: DC | PRN

## 2023-03-13 NOTE — Progress Notes (Signed)
Here to see provider about a boil he stated has been present for several months noted on left side of posterior neck with appearance red and raised and no drainage.

## 2023-03-13 NOTE — Progress Notes (Signed)
   Subjective: Cystic lesion    Patient ID: Kevin Reid, male    DOB: 16-Dec-1961, 62 y.o.   MRN: [id number]  HPI Patient presents for cystic lesion posterior left auricle area.  Patient stated lesion appeared a few months ago.  Patient has been trying to manipulate the lesion to express the material.  Denies fever chills associated complaint.  Denies pain.  Similar complaint a few years ago.   Review of Systems Anxiety, diabetes, hyperlipidemia, hypertension, sleep apnea.    Objective:   Physical Exam No acute distress. BP is 172/106.  Pulse 63 respiration 14, patient is temperature 97.5.  Patient 90% O2 sat on room air.  Patient weighs 228 pounds and BMI 32.71. Cystic lesion posterior left auricle area.  Lesion is fluctuant.  Mild erythema.  Weeping purulent material.       Assessment & Plan:  Obtain patient consent for I&D.  Area was surgical clean.  3 mL of 1% lidocaine with epi was instilled into the lesion.  0.5 cm incision allowed express of sebaceous and purulent material.  Area was copiously irrigated and packed with iodoform gauze.  Patient given a prescription for Bactrim DS and Percocets.  Patient will follow-up in 2 days for packing removal.

## 2023-03-14 DIAGNOSIS — M170 Bilateral primary osteoarthritis of knee: Secondary | ICD-10-CM | POA: Diagnosis not present

## 2023-03-14 DIAGNOSIS — M222X1 Patellofemoral disorders, right knee: Secondary | ICD-10-CM | POA: Diagnosis not present

## 2023-03-14 DIAGNOSIS — M222X2 Patellofemoral disorders, left knee: Secondary | ICD-10-CM | POA: Diagnosis not present

## 2023-03-15 ENCOUNTER — Encounter: Payer: Self-pay | Admitting: Physician Assistant

## 2023-03-15 ENCOUNTER — Ambulatory Visit: Payer: Self-pay | Admitting: Physician Assistant

## 2023-03-15 DIAGNOSIS — Z5189 Encounter for other specified aftercare: Secondary | ICD-10-CM

## 2023-03-15 NOTE — Progress Notes (Signed)
   Subjective: Wound check    Patient ID: Kevin Reid, male    DOB: 30-Dec-1960, 62 y.o.   MRN: [id number]  HPI Patient presents for evaluation of wound check of infected sebaceous cyst which was I&D on 03/13/2023.  Patient was no complaints.  Patient admits to noncompliance of antibiotics.  Denies fever associated complaint.  States there is no drainage.   Review of Systems Diabetes, hyperlipidemia,    Objective:   Physical Exam  Vital signs deferred. Area shows mild erythema and no edema.  No active drainage.      Assessment & Plan: Wound check   Packing tissue was removed.  Area was irrigated with clear return.  Site was clean and rebandaged.  Patient advised to continue home wound care and finish antibiotics.  Follow-up in 1 week.

## 2023-03-15 NOTE — Progress Notes (Signed)
Pt presents today for follow up on cyst removal. Kevin Reid

## 2023-03-18 DIAGNOSIS — G4731 Primary central sleep apnea: Secondary | ICD-10-CM | POA: Diagnosis not present

## 2023-03-18 DIAGNOSIS — G4733 Obstructive sleep apnea (adult) (pediatric): Secondary | ICD-10-CM | POA: Diagnosis not present

## 2023-03-22 ENCOUNTER — Ambulatory Visit: Payer: Self-pay | Admitting: Physician Assistant

## 2023-03-22 ENCOUNTER — Encounter: Payer: Self-pay | Admitting: Physician Assistant

## 2023-03-22 VITALS — BP 176/100 | HR 55 | Temp 97.1°F | Resp 12

## 2023-03-22 DIAGNOSIS — J029 Acute pharyngitis, unspecified: Secondary | ICD-10-CM | POA: Insufficient documentation

## 2023-03-22 DIAGNOSIS — M25519 Pain in unspecified shoulder: Secondary | ICD-10-CM | POA: Insufficient documentation

## 2023-03-22 DIAGNOSIS — M19012 Primary osteoarthritis, left shoulder: Secondary | ICD-10-CM | POA: Insufficient documentation

## 2023-03-22 DIAGNOSIS — F32A Depression, unspecified: Secondary | ICD-10-CM | POA: Insufficient documentation

## 2023-03-22 DIAGNOSIS — Z9889 Other specified postprocedural states: Secondary | ICD-10-CM | POA: Insufficient documentation

## 2023-03-22 DIAGNOSIS — Z5189 Encounter for other specified aftercare: Secondary | ICD-10-CM

## 2023-03-22 NOTE — Progress Notes (Signed)
   Subjective:Wound check    Patient ID: Kevin Reid, male    DOB: Jan 05, 1961, 62 y.o.   MRN: [id number]  HPI Follow up I/D of infected sebaceous cyst from 03/13/2023. States no compliant. Finished antibotics.   Review of Systems Diabetes,hyperlipidema, hypertension, and sleep apnea.    Objective:   Physical Exam BP is 176/100, pulse 55, respiration 12, temperature 97.1, patient 90% O2 sat on room air. Wound site shows mild edema but no erythema.  No active drainage.  Nontender to palpation.      Assessment & Plan: Wound check   Advised patient to follow-up with PCP secondary to elevated blood pressure.  Patient return back to this clinic if needed

## 2023-03-22 NOTE — Progress Notes (Signed)
States BP has been elevated & was put on new BP med & has been on it x1 week. This morning he checked it at home & it was 146/82

## 2023-03-26 DIAGNOSIS — S67190A Crushing injury of right index finger, initial encounter: Secondary | ICD-10-CM | POA: Diagnosis not present

## 2023-03-26 DIAGNOSIS — T148XXA Other injury of unspecified body region, initial encounter: Secondary | ICD-10-CM | POA: Diagnosis not present

## 2023-03-29 DIAGNOSIS — E782 Mixed hyperlipidemia: Secondary | ICD-10-CM | POA: Diagnosis not present

## 2023-03-29 DIAGNOSIS — Z79899 Other long term (current) drug therapy: Secondary | ICD-10-CM | POA: Diagnosis not present

## 2023-03-29 DIAGNOSIS — E119 Type 2 diabetes mellitus without complications: Secondary | ICD-10-CM | POA: Diagnosis not present

## 2023-03-29 DIAGNOSIS — T148XXA Other injury of unspecified body region, initial encounter: Secondary | ICD-10-CM | POA: Diagnosis not present

## 2023-03-29 DIAGNOSIS — S67190A Crushing injury of right index finger, initial encounter: Secondary | ICD-10-CM | POA: Diagnosis not present

## 2023-04-05 DIAGNOSIS — Z79899 Other long term (current) drug therapy: Secondary | ICD-10-CM | POA: Diagnosis not present

## 2023-04-05 DIAGNOSIS — E7800 Pure hypercholesterolemia, unspecified: Secondary | ICD-10-CM | POA: Diagnosis not present

## 2023-04-05 DIAGNOSIS — Z125 Encounter for screening for malignant neoplasm of prostate: Secondary | ICD-10-CM | POA: Diagnosis not present

## 2023-04-05 DIAGNOSIS — E1159 Type 2 diabetes mellitus with other circulatory complications: Secondary | ICD-10-CM | POA: Diagnosis not present

## 2023-04-05 DIAGNOSIS — G4733 Obstructive sleep apnea (adult) (pediatric): Secondary | ICD-10-CM | POA: Diagnosis not present

## 2023-04-05 DIAGNOSIS — I152 Hypertension secondary to endocrine disorders: Secondary | ICD-10-CM | POA: Diagnosis not present

## 2023-04-09 DIAGNOSIS — M170 Bilateral primary osteoarthritis of knee: Secondary | ICD-10-CM | POA: Diagnosis not present

## 2023-04-10 ENCOUNTER — Ambulatory Visit: Payer: Self-pay | Admitting: Physician Assistant

## 2023-04-10 ENCOUNTER — Encounter: Payer: Self-pay | Admitting: Physician Assistant

## 2023-04-10 VITALS — HR 56 | Temp 97.7°F | Resp 14 | Ht 70.0 in | Wt 227.0 lb

## 2023-04-10 DIAGNOSIS — Z0000 Encounter for general adult medical examination without abnormal findings: Secondary | ICD-10-CM

## 2023-04-10 NOTE — Progress Notes (Signed)
[facility]  ____________________________________________   None    (approximate)  I have reviewed the triage vital signs and the nursing notes.   HISTORY  Chief Complaint No chief complaint on file.   HPI Kevin Reid is a 62 y.o. male who presents for annual physical exam. Reports knee pain for which he is seeing orthopedics for. In no acute distress.         { Past Medical History:  Diagnosis Date   Allergy    Depression    Diabetes mellitus without complication (HCC)    GERD (gastroesophageal reflux disease)    Heart murmur    Hyperlipidemia    Hypertension    Joint pain    Sleep apnea     Patient Active Problem List   Diagnosis Date Noted   Osteoarthritis of left shoulder 03/22/2023   History of shoulder surgery 03/22/2023   Depressive disorder 03/22/2023   Shoulder pain 03/22/2023   Sore throat 03/22/2023   Rupture of tendon of biceps, long head 01/12/2022   Full thickness rotator cuff tear 12/05/2021   Pneumonia due to COVID-19 virus 12/30/2020   Acute hypoxemic respiratory failure due to COVID-19 12/29/2020   Chest pressure 01/26/2020   Bilateral lower extremity edema 12/09/2019   Chronic left shoulder pain 12/09/2019   Generalized anxiety disorder 05/15/2019   Long-term use of aspirin therapy 01/30/2019   Diabetes mellitus, type 2 11/19/2018   Hyperlipemia 11/19/2018   Hypertension 11/19/2018   Sleep apnea 11/19/2018    Past Surgical History:  Procedure Laterality Date   carpel tunnel     COLONOSCOPY WITH PROPOFOL N/A 06/21/2015   Procedure: COLONOSCOPY WITH PROPOFOL;  Surgeon: Christena Deem, MD;  Location: [facility] ENDOSCOPY;  Service: Endoscopy;  Laterality: N/A;   COLONOSCOPY WITH PROPOFOL N/A 07/20/2015   Procedure: COLONOSCOPY WITH PROPOFOL;  Surgeon: Christena Deem, MD;  Location: [facility] ENDOSCOPY;  Service: Endoscopy;  Laterality: N/A;   WISDOM TOOTH EXTRACTION      Prior to Admission  medications   Medication Sig Start Date End Date Taking? Authorizing Provider  albuterol (VENTOLIN HFA) 108 (90 Base) MCG/ACT inhaler albuterol sulfate HFA 90 mcg/actuation aerosol inhaler 05/16/21   [provider]  amLODipine (NORVASC) 10 MG tablet Take 1 tablet (10 mg total) by mouth daily. 06/29/20   Gweneth Dimitri, MD  atenolol (TENORMIN) 25 MG tablet Take 1 tablet by mouth daily. 03/15/23 03/14/24  [provider]  bisoprolol-hydrochlorothiazide (ZIAC) 5-6.25 MG tablet TAKE TWO TABLETS EVERY DAY Patient taking differently: Take 1 tablet by mouth daily. 07/07/20   Gweneth Dimitri, MD  Blood Glucose Monitoring Suppl (BLOOD GLUCOSE MONITOR SYSTEM) w/Device KIT Use to test blood sugar once daily 04/06/20   Langston Reusing, MD  cetirizine (ZYRTEC) 10 MG tablet Take 1 tablet by mouth daily. Patient not taking: Reported on 03/22/2023    [provider]  fenofibrate (TRICOR) 145 MG tablet Take 1 tablet (145 mg total) by mouth daily. 01/26/20   Gweneth Dimitri, MD  fluticasone Aleda Grana) 50 MCG/ACT nasal spray fluticasone propionate 50 mcg/actuation nasal spray,suspension 12/06/21   [provider]  glucose blood (CONTOUR NEXT TEST) test strip Check sugar once daily. DX E11.9 02/05/20   Gweneth Dimitri, MD  Lancets MISC 1 each by Does not apply route daily as needed. 12/09/19   Gweneth Dimitri, MD  levocetirizine (XYZAL) 5 MG tablet Take 5 mg by mouth every evening.    [provider]  metFORMIN (GLUCOPHAGE) 1000 MG tablet  Take 1 tablet (1,000 mg total) by mouth 2 (two) times daily. 11/29/20   Gweneth Dimitri, MD  MOUNJARO 5 MG/0.5ML Pen INJECT 0.5 MLS (5 MG TOTAL) SUBCUTANEOUSLY EVERY 7 (SEVEN) DAYS 03/02/23   [provider]  NON FORMULARY CPAP MACHINE USING EVERY NIGHT setting at 10    [provider]  oxyCODONE-acetaminophen (PERCOCET) 7.5-325 MG tablet Take 1 tablet by mouth every 6 (six) hours as needed for severe pain. Patient not taking: Reported on  03/22/2023 03/13/23   Joni Reining, PA-C  PARoxetine (PAXIL) 20 MG tablet TAKE 3 TABLETS BY MOUTH ONCE DAILY Patient taking differently: Take 60 mg by mouth daily. 07/08/20   Gweneth Dimitri, MD  Semaglutide,0.25 or 0.5MG /DOS, (OZEMPIC, 0.25 OR 0.5 MG/DOSE,) 2 MG/1.5ML SOPN INJECT 0.5 MG INTO THE SKIN ONCE A WEEK Patient not taking: Reported on 03/13/2023 06/07/20   Gweneth Dimitri, MD  sulfamethoxazole-trimethoprim (BACTRIM DS) 800-160 MG tablet Take 1 tablet by mouth 2 (two) times daily. 03/13/23   Joni Reining, PA-C  Vitamin D, Ergocalciferol, (DRISDOL) 1.25 MG (50000 UNIT) CAPS capsule Take 1 capsule (50,000 Units total) by mouth every 7 (seven) days. Patient not taking: Reported on 03/13/2023 04/06/20   Langston Reusing, MD    Allergies Codeine, Other, and Simvastatin  Family History  Problem Relation Age of Onset   Prostate cancer Father 62   Hyperlipidemia Father    Cancer Maternal Grandmother    Heart attack Maternal Grandfather    Stroke Paternal Grandmother        old   Stomach cancer Paternal Grandfather     Social History Social History   Tobacco Use   Smoking status: Former    Packs/day: 1.00    Years: 8.00    Additional pack years: 0.00    Total pack years: 8.00    Types: Cigarettes    Quit date: 04/24/2018    Years since quitting: 4.9   Smokeless tobacco: Former    Types: Chew    Quit date: 11/19/2013  Vaping Use   Vaping Use: Never used  Substance Use Topics   Alcohol use: Yes    Comment: not regularly currently   Drug use: No    Review of Systems Constitutional: No fever/chills Eyes: No visual changes. ENT: No sore throat. Cardiovascular: Denies chest pain. Respiratory: Denies shortness of breath. Gastrointestinal: No abdominal pain.  No nausea, no vomiting.  No diarrhea.  No constipation. Genitourinary: Negative for dysuria. Musculoskeletal: Negative for back pain. Knee pain.  Skin: Negative for rash. Neurological: Negative for headaches, focal  weakness or numbness. Psychiatric: Depression Endocrine: Diabetes, hyperlipidemia, and hypertension   ____________________________________________   PHYSICAL EXAM:  VITAL SIGNS:  Pulse 56  Resp 14  Temp 97.7 F (36.5 C)  SpO2 97 %  Weight 227 lb (103 kg)  Height  (1.778 m)   Constitutional: Alert and oriented. Well appearing and in no acute distress. Eyes: Conjunctivae are normal. PERRL. EOMI. Head: Atraumatic. Nose: No congestion/rhinnorhea. Mouth/Throat: Mucous membranes are moist.  Oropharynx non-erythematous. Neck: No stridor. No cervical spine tenderness to palpation. Hematological/Lymphatic/Immunilogical: No cervical lymphadenopathy. Cardiovascular: Normal rate, irregular rhythm. Grossly normal heart sounds.  Good peripheral circulation. Respiratory: Normal respiratory effort.  No retractions. Lungs CTAB. Gastrointestinal: Soft and nontender. No distention. No abdominal bruits. No CVA tenderness. Musculoskeletal: No lower extremity tenderness nor edema.  No joint effusions. Limited mobility in knees.  Neurologic:  Normal speech and language. No gross focal neurologic deficits are appreciated. No gait instability. Skin:  Skin is warm, dry and intact. No rash noted. Psychiatric: Mood and affect are normal. Speech and behavior are normal.  ____________________________________________   LABS  Order: [id number] Component Ref Range & Units 12 d ago  WBC (White Blood Cell Count) 4.1 - 10.2 10^3/uL 5.5  RBC (Red Blood Cell Count) 4.69 - 6.13 10^6/uL 4.61 Low   Hemoglobin 14.1 - 18.1 gm/dL 57.8  Hematocrit 46.9 - 52.0 % 41.6  MCV (Mean Corpuscular Volume) 80.0 - 100.0 fl 90.2  MCH (Mean Corpuscular Hemoglobin) 27.0 - 31.2 pg 30.8  MCHC (Mean Corpuscular Hemoglobin Concentration) 32.0 - 36.0 gm/dL 62.9  Platelet Count 528 - 450 10^3/uL 195  RDW-CV (Red Cell Distribution Width) 11.6 - 14.8 % 12.4  MPV (Mean Platelet Volume) 9.4 - 12.4 fl 12.6 High    Neutrophils 1.50 - 7.80 10^3/uL 3.25  Lymphocytes 1.00 - 3.60 10^3/uL 1.58  Monocytes 0.00 - 1.50 10^3/uL 0.42  Eosinophils 0.00 - 0.55 10^3/uL 0.12  Basophils 0.00 - 0.09 10^3/uL 0.08  Neutrophil % 32.0 - 70.0 % 59.5  Lymphocyte % 10.0 - 50.0 % 28.9  Monocyte % 4.0 - 13.0 % 7.7  Eosinophil % 1.0 - 5.0 % 2.2  Basophil% 0.0 - 2.0 % 1.5  Immature Granulocyte % <=0.7 % 0.2  Immature Granulocyte Count <=0.06 10^3/L 0.01  Resulting Agency [facility]   Specimen Collected: 03/29/23 09:37   Performed by: [facility] Last Resulted: 03/29/23 15:51  Received From: [facility]  Result Received: 04/06/23 09:29   Received Information  CBC w/auto Differential (5 Part) (Order [id number])    suggestion  Information displayed in this report may not trend or trigger automated decision support.     Contains abnormal data CBC w/auto Differential (5 Part) Order: [id number] Component Ref Range & Units 12 d ago  WBC (White Blood Cell Count) 4.1 - 10.2 10^3/uL 5.5  RBC (Red Blood Cell Count) 4.69 - 6.13 10^6/uL 4.61 Low   Hemoglobin 14.1 - 18.1 gm/dL 03.4  Hematocrit 74.2 - 52.0 % 41.6  MCV (Mean Corpuscular Volume) 80.0 - 100.0 fl 90.2  MCH (Mean Corpuscular Hemoglobin) 27.0 - 31.2 pg 30.8  MCHC (Mean Corpuscular Hemoglobin Concentration) 32.0 - 36.0 gm/dL 59.5  Platelet Count 638 - 450 10^3/uL 195  RDW-CV (Red Cell Distribution Width) 11.6 - 14.8 % 12.4  MPV (Mean Platelet Volume) 9.4 - 12.4 fl 12.6 High   Neutrophils 1.50 - 7.80 10^3/uL 3.25  Lymphocytes 1.00 - 3.60 10^3/uL 1.58  Monocytes 0.00 - 1.50 10^3/uL 0.42  Eosinophils 0.00 - 0.55 10^3/uL 0.12  Basophils 0.00 - 0.09 10^3/uL 0.08  Neutrophil % 32.0 - 70.0 % 59.5  Lymphocyte % 10.0 - 50.0 % 28.9  Monocyte % 4.0 - 13.0 % 7.7  Eosinophil % 1.0 - 5.0 % 2.2  Basophil% 0.0 - 2.0 % 1.5  Immature Granulocyte % <=0.7 % 0.2  Immature Granulocyte Count    _____  12 d ago   Cholesterol, Total 100 - 200 mg/dL 756  Triglyceride 35 - 199 mg/dL 433  HDL (High Density Lipoprotein) Cholesterol 29.0 - 71.0 mg/dL 29.5  LDL Calculated 0 - 130 mg/dL 74  VLDL Cholesterol mg/dL 25   Order: [id number]  Component Ref Range & Units 12 d ago  Hemoglobin A1C 4.2 - 5.6 % 6.3 High   Average Blood Glucose (Calc) mg/dL 301         _______________________________________  EKG No acute findings or EKG  ____________________________________________  ____________________________________________   INITIAL IMPRESSION / ASSESSMENT AND PLAN   As part of my medical decision making, I reviewed the following data within the electronic MEDICAL RECORD NUMBER   No acute findings on physical exam.         ____________________________________________   FINAL CLINICAL IMPRESSION  Well exam   ED Discharge Orders     None        Note:  This document was prepared using Dragon voice recognition software and may include unintentional dictation errors.

## 2023-04-10 NOTE — Progress Notes (Signed)
Here for provider physical as his PCP was unable to see him for current fiscal year physical, but he stated that he follows with his DM and HTN with meds with [facility].  No complaints voiced today.  Stated he does not check blood sugar daily.

## 2023-04-23 DIAGNOSIS — G4733 Obstructive sleep apnea (adult) (pediatric): Secondary | ICD-10-CM | POA: Diagnosis not present

## 2023-04-23 DIAGNOSIS — G4731 Primary central sleep apnea: Secondary | ICD-10-CM | POA: Diagnosis not present

## 2023-05-23 DIAGNOSIS — G4733 Obstructive sleep apnea (adult) (pediatric): Secondary | ICD-10-CM | POA: Diagnosis not present

## 2023-05-23 DIAGNOSIS — G4731 Primary central sleep apnea: Secondary | ICD-10-CM | POA: Diagnosis not present

## 2023-06-23 DIAGNOSIS — G4733 Obstructive sleep apnea (adult) (pediatric): Secondary | ICD-10-CM | POA: Diagnosis not present

## 2023-06-23 DIAGNOSIS — G4731 Primary central sleep apnea: Secondary | ICD-10-CM | POA: Diagnosis not present

## 2023-08-03 DIAGNOSIS — G4733 Obstructive sleep apnea (adult) (pediatric): Secondary | ICD-10-CM | POA: Diagnosis not present

## 2023-08-10 DIAGNOSIS — I152 Hypertension secondary to endocrine disorders: Secondary | ICD-10-CM | POA: Diagnosis not present

## 2023-08-10 DIAGNOSIS — R079 Chest pain, unspecified: Secondary | ICD-10-CM | POA: Diagnosis not present

## 2023-08-10 DIAGNOSIS — E7800 Pure hypercholesterolemia, unspecified: Secondary | ICD-10-CM | POA: Diagnosis not present

## 2023-08-10 DIAGNOSIS — E1159 Type 2 diabetes mellitus with other circulatory complications: Secondary | ICD-10-CM | POA: Diagnosis not present

## 2023-09-03 DIAGNOSIS — G4731 Primary central sleep apnea: Secondary | ICD-10-CM | POA: Diagnosis not present

## 2023-09-03 DIAGNOSIS — G4733 Obstructive sleep apnea (adult) (pediatric): Secondary | ICD-10-CM | POA: Diagnosis not present

## 2023-10-03 DIAGNOSIS — G4731 Primary central sleep apnea: Secondary | ICD-10-CM | POA: Diagnosis not present

## 2023-10-03 DIAGNOSIS — G4733 Obstructive sleep apnea (adult) (pediatric): Secondary | ICD-10-CM | POA: Diagnosis not present

## 2023-10-30 DIAGNOSIS — K219 Gastro-esophageal reflux disease without esophagitis: Secondary | ICD-10-CM | POA: Diagnosis not present

## 2023-10-30 DIAGNOSIS — Z23 Encounter for immunization: Secondary | ICD-10-CM | POA: Diagnosis not present

## 2023-11-01 DIAGNOSIS — G4731 Primary central sleep apnea: Secondary | ICD-10-CM | POA: Diagnosis not present

## 2023-11-01 DIAGNOSIS — G4733 Obstructive sleep apnea (adult) (pediatric): Secondary | ICD-10-CM | POA: Diagnosis not present

## 2023-11-02 ENCOUNTER — Emergency Department: Payer: 59

## 2023-11-02 ENCOUNTER — Encounter: Payer: Self-pay | Admitting: Emergency Medicine

## 2023-11-02 ENCOUNTER — Other Ambulatory Visit: Payer: Self-pay

## 2023-11-02 ENCOUNTER — Inpatient Hospital Stay
Admission: EM | Admit: 2023-11-02 | Discharge: 2023-11-08 | DRG: 322 | Disposition: A | Discharge status: Home / Self Care | Payer: 59 | Attending: Internal Medicine | Admitting: Internal Medicine

## 2023-11-02 DIAGNOSIS — E7800 Pure hypercholesterolemia, unspecified: Secondary | ICD-10-CM | POA: Diagnosis present

## 2023-11-02 DIAGNOSIS — Z6831 Body mass index (BMI) 31.0-31.9, adult: Secondary | ICD-10-CM

## 2023-11-02 DIAGNOSIS — Z7982 Long term (current) use of aspirin: Secondary | ICD-10-CM

## 2023-11-02 DIAGNOSIS — E785 Hyperlipidemia, unspecified: Secondary | ICD-10-CM | POA: Diagnosis not present

## 2023-11-02 DIAGNOSIS — E876 Hypokalemia: Secondary | ICD-10-CM | POA: Diagnosis present

## 2023-11-02 DIAGNOSIS — E669 Obesity, unspecified: Secondary | ICD-10-CM | POA: Diagnosis not present

## 2023-11-02 DIAGNOSIS — E66811 Obesity, class 1: Secondary | ICD-10-CM | POA: Diagnosis present

## 2023-11-02 DIAGNOSIS — I2510 Atherosclerotic heart disease of native coronary artery without angina pectoris: Secondary | ICD-10-CM | POA: Diagnosis present

## 2023-11-02 DIAGNOSIS — Z8249 Family history of ischemic heart disease and other diseases of the circulatory system: Secondary | ICD-10-CM

## 2023-11-02 DIAGNOSIS — E1169 Type 2 diabetes mellitus with other specified complication: Secondary | ICD-10-CM | POA: Diagnosis present

## 2023-11-02 DIAGNOSIS — Z7985 Long-term (current) use of injectable non-insulin antidiabetic drugs: Secondary | ICD-10-CM | POA: Diagnosis not present

## 2023-11-02 DIAGNOSIS — Z888 Allergy status to other drugs, medicaments and biological substances status: Secondary | ICD-10-CM | POA: Diagnosis not present

## 2023-11-02 DIAGNOSIS — G4730 Sleep apnea, unspecified: Secondary | ICD-10-CM | POA: Diagnosis present

## 2023-11-02 DIAGNOSIS — Z809 Family history of malignant neoplasm, unspecified: Secondary | ICD-10-CM

## 2023-11-02 DIAGNOSIS — Z7984 Long term (current) use of oral hypoglycemic drugs: Secondary | ICD-10-CM | POA: Diagnosis not present

## 2023-11-02 DIAGNOSIS — G4733 Obstructive sleep apnea (adult) (pediatric): Secondary | ICD-10-CM | POA: Diagnosis present

## 2023-11-02 DIAGNOSIS — E119 Type 2 diabetes mellitus without complications: Secondary | ICD-10-CM

## 2023-11-02 DIAGNOSIS — Z8042 Family history of malignant neoplasm of prostate: Secondary | ICD-10-CM | POA: Diagnosis not present

## 2023-11-02 DIAGNOSIS — Z7902 Long term (current) use of antithrombotics/antiplatelets: Secondary | ICD-10-CM

## 2023-11-02 DIAGNOSIS — I10 Essential (primary) hypertension: Secondary | ICD-10-CM | POA: Diagnosis not present

## 2023-11-02 DIAGNOSIS — E871 Hypo-osmolality and hyponatremia: Secondary | ICD-10-CM | POA: Diagnosis not present

## 2023-11-02 DIAGNOSIS — Z79899 Other long term (current) drug therapy: Secondary | ICD-10-CM | POA: Diagnosis not present

## 2023-11-02 DIAGNOSIS — Z885 Allergy status to narcotic agent status: Secondary | ICD-10-CM

## 2023-11-02 DIAGNOSIS — K219 Gastro-esophageal reflux disease without esophagitis: Secondary | ICD-10-CM | POA: Diagnosis not present

## 2023-11-02 DIAGNOSIS — E1165 Type 2 diabetes mellitus with hyperglycemia: Secondary | ICD-10-CM | POA: Diagnosis not present

## 2023-11-02 DIAGNOSIS — F32A Depression, unspecified: Secondary | ICD-10-CM | POA: Diagnosis present

## 2023-11-02 DIAGNOSIS — R001 Bradycardia, unspecified: Secondary | ICD-10-CM | POA: Diagnosis present

## 2023-11-02 DIAGNOSIS — Z83438 Family history of other disorder of lipoprotein metabolism and other lipidemia: Secondary | ICD-10-CM

## 2023-11-02 DIAGNOSIS — Z96612 Presence of left artificial shoulder joint: Secondary | ICD-10-CM | POA: Diagnosis not present

## 2023-11-02 DIAGNOSIS — Z87891 Personal history of nicotine dependence: Secondary | ICD-10-CM

## 2023-11-02 DIAGNOSIS — I214 Non-ST elevation (NSTEMI) myocardial infarction: Secondary | ICD-10-CM | POA: Diagnosis not present

## 2023-11-02 DIAGNOSIS — I200 Unstable angina: Principal | ICD-10-CM

## 2023-11-02 DIAGNOSIS — Z823 Family history of stroke: Secondary | ICD-10-CM

## 2023-11-02 DIAGNOSIS — R079 Chest pain, unspecified: Secondary | ICD-10-CM | POA: Diagnosis not present

## 2023-11-02 DIAGNOSIS — Z800 Family history of malignant neoplasm of digestive organs: Secondary | ICD-10-CM

## 2023-11-02 LAB — CBC WITH DIFFERENTIAL/PLATELET
Abs Immature Granulocytes: 0.02 10*3/uL (ref 0.00–0.07)
Basophils Absolute: 0.1 10*3/uL (ref 0.0–0.1)
Basophils Relative: 1 %
Eosinophils Absolute: 0.1 10*3/uL (ref 0.0–0.5)
Eosinophils Relative: 2 %
HCT: 40.1 % (ref 39.0–52.0)
Hemoglobin: 14.2 g/dL (ref 13.0–17.0)
Immature Granulocytes: 0 %
Lymphocytes Relative: 22 %
Lymphs Abs: 1.3 10*3/uL (ref 0.7–4.0)
MCH: 30.7 pg (ref 26.0–34.0)
MCHC: 35.4 g/dL (ref 30.0–36.0)
MCV: 86.6 fL (ref 80.0–100.0)
Monocytes Absolute: 0.5 10*3/uL (ref 0.1–1.0)
Monocytes Relative: 8 %
Neutro Abs: 4.2 10*3/uL (ref 1.7–7.7)
Neutrophils Relative %: 67 %
Platelets: 184 10*3/uL (ref 150–400)
RBC: 4.63 MIL/uL (ref 4.22–5.81)
RDW: 11.9 % (ref 11.5–15.5)
WBC: 6.2 10*3/uL (ref 4.0–10.5)
nRBC: 0 % (ref 0.0–0.2)

## 2023-11-02 LAB — BASIC METABOLIC PANEL
Anion gap: 10 (ref 5–15)
BUN: 26 mg/dL — ABNORMAL HIGH (ref 8–23)
CO2: 24 mmol/L (ref 22–32)
Calcium: 9 mg/dL (ref 8.9–10.3)
Chloride: 98 mmol/L (ref 98–111)
Creatinine, Ser: 0.98 mg/dL (ref 0.61–1.24)
GFR, Estimated: >60 mL/min (ref >60)
Glucose, Bld: 117 mg/dL — ABNORMAL HIGH (ref 70–99)
Potassium: 3.2 mmol/L — ABNORMAL LOW (ref 3.5–5.1)
Sodium: 132 mmol/L — ABNORMAL LOW (ref 135–145)

## 2023-11-02 LAB — PROTIME-INR
INR: 1 (ref 0.8–1.2)
Prothrombin Time: 13.7 s (ref 11.4–15.2)

## 2023-11-02 LAB — TSH: TSH: 3.115 u[IU]/mL (ref 0.350–4.500)

## 2023-11-02 LAB — CBG MONITORING, ED: Glucose-Capillary: 89 mg/dL (ref 70–99)

## 2023-11-02 LAB — TROPONIN I (HIGH SENSITIVITY)
Troponin I (High Sensitivity): 144 ng/L (ref <18)
Troponin I (High Sensitivity): 168 ng/L (ref <18)

## 2023-11-02 LAB — HEMOGLOBIN A1C
Hgb A1c MFr Bld: 5.4 % (ref 4.8–5.6)
Mean Plasma Glucose: 108.28 mg/dL

## 2023-11-02 LAB — APTT: aPTT: 31 s (ref 24–36)

## 2023-11-02 MED ORDER — ATORVASTATIN CALCIUM 80 MG PO TABS
80.0000 mg | ORAL_TABLET | Freq: Every day | ORAL | Status: DC
Start: 2023-11-02 — End: 2023-11-08
  Administered 2023-11-02 – 2023-11-08 (×7): 80 mg via ORAL
  Filled 2023-11-02 (×2): qty 4
  Filled 2023-11-02 (×5): qty 1

## 2023-11-02 MED ORDER — HEPARIN BOLUS VIA INFUSION
4000.0000 [IU] | Freq: Once | INTRAVENOUS | Status: AC
Start: 2023-11-02 — End: 2023-11-02
  Administered 2023-11-02: 4000 [IU] via INTRAVENOUS
  Filled 2023-11-02: qty 4000

## 2023-11-02 MED ORDER — IRBESARTAN 150 MG PO TABS
300.0000 mg | ORAL_TABLET | Freq: Every day | ORAL | Status: DC
  Administered 2023-11-03 – 2023-11-08 (×6): 300 mg via ORAL
  Filled 2023-11-02 (×6): qty 2

## 2023-11-02 MED ORDER — INSULIN ASPART 100 UNIT/ML IJ SOLN
0.0000 [IU] | Freq: Three times a day (TID) | INTRAMUSCULAR | Status: DC
  Administered 2023-11-03: 3 [IU] via SUBCUTANEOUS
  Administered 2023-11-04: 2 [IU] via SUBCUTANEOUS
  Administered 2023-11-05: 3 [IU] via SUBCUTANEOUS
  Administered 2023-11-05 – 2023-11-06 (×2): 2 [IU] via SUBCUTANEOUS
  Filled 2023-11-02 (×6): qty 1

## 2023-11-02 MED ORDER — ONDANSETRON HCL 4 MG/2ML IJ SOLN: 4.0000 mg | Freq: Four times a day (QID) | INTRAMUSCULAR | Status: DC | PRN

## 2023-11-02 MED ORDER — NITROGLYCERIN 0.4 MG SL SUBL
0.4000 mg | SUBLINGUAL_TABLET | SUBLINGUAL | Status: DC | PRN
  Administered 2023-11-02 – 2023-11-06 (×5): 0.4 mg via SUBLINGUAL
  Filled 2023-11-02 (×3): qty 1

## 2023-11-02 MED ORDER — HEPARIN (PORCINE) 25000 UT/250ML-% IV SOLN
1800.0000 [IU]/h | INTRAVENOUS | Status: DC
  Administered 2023-11-02: 1300 [IU]/h via INTRAVENOUS
  Administered 2023-11-03: 1600 [IU]/h via INTRAVENOUS
  Administered 2023-11-03 – 2023-11-06 (×4): 1800 [IU]/h via INTRAVENOUS
  Filled 2023-11-02 (×7): qty 250

## 2023-11-02 MED ORDER — PAROXETINE HCL 30 MG PO TABS
60.0000 mg | ORAL_TABLET | Freq: Every day | ORAL | Status: DC
  Administered 2023-11-03 – 2023-11-08 (×6): 60 mg via ORAL
  Filled 2023-11-02 (×6): qty 2

## 2023-11-02 MED ORDER — ASPIRIN 81 MG PO TBEC
81.0000 mg | DELAYED_RELEASE_TABLET | Freq: Every day | ORAL | Status: DC
  Administered 2023-11-03 – 2023-11-06 (×4): 81 mg via ORAL
  Filled 2023-11-02 (×4): qty 1

## 2023-11-02 MED ORDER — FENOFIBRATE 160 MG PO TABS
160.0000 mg | ORAL_TABLET | Freq: Every day | ORAL | Status: DC
  Administered 2023-11-03 – 2023-11-08 (×6): 160 mg via ORAL
  Filled 2023-11-02 (×6): qty 1

## 2023-11-02 MED ORDER — POTASSIUM CHLORIDE CRYS ER 20 MEQ PO TBCR
40.0000 meq | EXTENDED_RELEASE_TABLET | Freq: Once | ORAL | Status: AC
  Administered 2023-11-02: 40 meq via ORAL
  Filled 2023-11-02: qty 2

## 2023-11-02 MED ORDER — ACETAMINOPHEN 325 MG PO TABS
650.0000 mg | ORAL_TABLET | ORAL | Status: DC | PRN
  Administered 2023-11-07: 650 mg via ORAL
  Filled 2023-11-02: qty 2

## 2023-11-02 MED ORDER — ASPIRIN 81 MG PO CHEW
324.0000 mg | CHEWABLE_TABLET | Freq: Once | ORAL | Status: AC
  Administered 2023-11-02: 324 mg via ORAL
  Filled 2023-11-02: qty 4

## 2023-11-02 NOTE — ED Triage Notes (Signed)
Pt comes via [facility] with 2 months of burning cp. Pt states worse after eating. Pt states no relief with meds. Pt states worse with exertion.

## 2023-11-02 NOTE — Assessment & Plan Note (Addendum)
Patient on Avapro, hydrochlorothiazide, low-dose bisoprolol and Norvasc.

## 2023-11-02 NOTE — ED Provider Notes (Signed)
[facility] Provider Note    Event Date/Time   First MD Initiated Contact with Patient 11/02/23 1416     (approximate)   History   Chest Pain   HPI  Kevin Reid is a 62 y.o. male currently on PPI for history of reflux presents to the ER for evaluation of worsening exertional chest pain burning in nature right side going up and shoulder.  No associated diaphoresis.  Does improve with rest but over the past week is started to have pain even at rest.  Does have a history of hypertension hypercholesterolemia as well as diabetes does have family history of cardiac disease.  He does not smoke.     Physical Exam   Triage Vital Signs: ED Triage Vitals  Encounter Vitals Group     BP 11/02/23 1402 (!) 164/96     Systolic BP Percentile --      Diastolic BP Percentile --      Pulse Rate 11/02/23 1402 (!) 56     Resp 11/02/23 1402 17     Temp 11/02/23 1402 97.8 F (36.6 C)     Temp Source 11/02/23 1402 Oral     SpO2 11/02/23 1402 96 %     Weight 11/02/23 1403 218 lb (98.9 kg)     Height 11/02/23 1403 5\' 10"  (1.778 m)     Head Circumference --      Peak Flow --      Pain Score 11/02/23 1403 6     Pain Loc --      Pain Education --      Exclude from Growth Chart --     Most recent vital signs: Vitals:   11/02/23 1402  BP: (!) 164/96  Pulse: (!) 56  Resp: 17  Temp: 97.8 F (36.6 C)  SpO2: 96%     Constitutional: Alert  Eyes: Conjunctivae are normal.  Head: Atraumatic. Nose: No congestion/rhinnorhea. Mouth/Throat: Mucous membranes are moist.   Neck: Painless ROM.  Cardiovascular:   Good peripheral circulation. Respiratory: Normal respiratory effort.  No retractions.  Gastrointestinal: Soft and nontender.  Musculoskeletal:  no deformity Neurologic:  MAE spontaneously. No gross focal neurologic deficits are appreciated.  Skin:  Skin is warm, dry and intact. No rash noted. Psychiatric: Mood and affect are normal. Speech and behavior are  normal.    ED Results / Procedures / Treatments   Labs (all labs ordered are listed, but only abnormal results are displayed) Labs Reviewed  BASIC METABOLIC PANEL - Abnormal; Notable for the following components:      Result Value   Sodium 132 (*)    Potassium 3.2 (*)    Glucose, Bld 117 (*)    BUN 26 (*)    All other components within normal limits  TROPONIN I (HIGH SENSITIVITY) - Abnormal; Notable for the following components:   Troponin I (High Sensitivity) 144 (*)    All other components within normal limits  CBC WITH DIFFERENTIAL/PLATELET     EKG  ED ECG REPORT I, Willy Eddy, the attending physician, personally viewed and interpreted this ECG.   Date: 11/02/2023  EKG Time: 14:03  Rate: 55  Rhythm: sinus  Axis: left  Intervals: normal  ST&T Change: biphasic t wave abn v2-v4 with inversions in I and aVL.  New compared to prior    RADIOLOGY Please see ED Course for my review and interpretation.  I personally reviewed all radiographic images ordered to evaluate for the above acute complaints and reviewed  radiology reports and findings.  These findings were personally discussed with the patient.  Please see medical record for radiology report.    PROCEDURES:  Critical Care performed: Yes, see critical care procedure note(s)  .Critical Care  Performed by: Willy Eddy, MD Authorized by: Willy Eddy, MD   Critical care provider statement:    Critical care time (minutes):  35   Critical care was necessary to treat or prevent imminent or life-threatening deterioration of the following conditions:  Circulatory failure   Critical care was time spent personally by me on the following activities:  Ordering and performing treatments and interventions, ordering and review of laboratory studies, ordering and review of radiographic studies, pulse oximetry, re-evaluation of patient's condition, review of old charts, obtaining history from patient or  surrogate, examination of patient, evaluation of patient's response to treatment, discussions with primary provider, discussions with consultants and development of treatment plan with patient or surrogate    MEDICATIONS ORDERED IN ED: Medications  nitroGLYCERIN (NITROSTAT) SL tablet 0.4 mg (0.4 mg Sublingual Given 11/02/23 1524)  aspirin chewable tablet 324 mg (324 mg Oral Given 11/02/23 1523)     IMPRESSION / MDM / ASSESSMENT AND PLAN / ED COURSE  I reviewed the triage vital signs and the nursing notes.                              Differential diagnosis includes, but is not limited to, ACS, pericarditis, esophagitis, boerhaaves, pe, dissection, pna, bronchitis, costochondritis  Patient presenting to the ER for evaluation of symptoms as described above.  Based on symptoms, risk factors and considered above differential, this presenting complaint could reflect a potentially life-threatening illness therefore the patient will be placed on continuous pulse oximetry and telemetry for monitoring.  Laboratory evaluation will be sent to evaluate for the above complaints.  X-ray on my review and potation without evidence of consolidation.  His history and presentation is highly suspicious for unstable angina.  Will give aspirin nitrate.  Will check labs.  Suspicion for PE or dissection.   Clinical Course as of 11/02/23 1531  Fri Nov 02, 2023  1523 Patient's troponin is significantly elevated consistent with NSTEMI will consult cardiology will heparinize.  Will consult hospitalist for admission. [PR]    Clinical Course User Index [PR] Willy Eddy, MD     FINAL CLINICAL IMPRESSION(S) / ED DIAGNOSES   Final diagnoses:  Unstable angina (HCC)     Rx / DC Orders   ED Discharge Orders     None        Note:  This document was prepared using Dragon voice recognition software and may include unintentional dictation errors.    Willy Eddy, MD 11/02/23 [phone]

## 2023-11-02 NOTE — ED Notes (Signed)
Pt given diet drink, sterile water provided for c-pap, urinal provided. No further needs identified by pt at this time.

## 2023-11-02 NOTE — Assessment & Plan Note (Addendum)
Patient is presenting with new onset chest pain for 1 week that is substernal with radiation to both arms.  Significantly worse with exertion.  Although not severe, notable troponin leak at 144 up to 168, with some nonspecific T wave inversions on the EKG.  Given patient's risk factors, he is at high risk for ACS/CAD.   - Telemetry monitoring - Cardiology consulted; appreciate their recommendations. EDP discussed case with Dr. Melton Alar - Heparin infusion per pharmacy dosing - Nitroglycerin sublingual as needed for chest pain - Aspirin 324 mg once followed by aspirin 81 mg daily - Start atorvastatin 80 mg daily - Avoid metoprolol for now given bradycardia - Echocardiogram ordered - A1c and lipid panel

## 2023-11-02 NOTE — Assessment & Plan Note (Signed)
Last A1c of 6.3% in April 2024.  - Hold home antiglycemic agents - SSI, moderate

## 2023-11-02 NOTE — Consult Note (Signed)
Pharmacy Consult Note - Anticoagulation  Pharmacy Consult for heparin Indication: chest pain/ACS  PATIENT MEASUREMENTS: Height: 5\' 10"  (177.8 cm) Weight: 98.9 kg (218 lb) IBW/kg (Calculated) : 73 HEPARIN DW (KG): 93.5  VITAL SIGNS: Temp: 97.8 F (36.6 C) (11/08 1402) Temp Source: Oral (11/08 1402) BP: 164/96 (11/08 1402) Pulse Rate: 56 (11/08 1402)  Recent Labs    11/02/23 1430  HGB 14.2  HCT 40.1  PLT 184  CREATININE 0.98  TROPONINIHS 144*    Estimated Creatinine Clearance: 93.4 mL/min (by C-G formula based on SCr of 0.98 mg/dL).  PAST MEDICAL HISTORY: Past Medical History:  Diagnosis Date   Allergy    Depression    Diabetes mellitus without complication (HCC)    GERD (gastroesophageal reflux disease)    Heart murmur    Hyperlipidemia    Hypertension    Joint pain    Sleep apnea     ASSESSMENT: 62 y.o. male with PMH including GERD, HTN is presenting with exertional chest pain. Patient is not on chronic anticoagulation per chart review. ECG exhibiting possible ST-T changes and cTn elevated at 144. Pharmacy has been consulted to initiate and manage heparin intravenous infusion.  Pertinent medications: No chronic anticoagulation PTA per chart review  Goal(s) of therapy: Heparin level 0.3 - 0.7 units/mL Monitor platelets by anticoagulation protocol: Yes   Baseline anticoagulation labs: Recent Labs    11/02/23 1430  HGB 14.2  PLT 184    Date Time aPTT/HL Rate/Comment      PLAN: Give 4000 units bolus x1; then start heparin infusion at 1300 units/hour. Check heparin level in 6 hours, then daily once at least two levels are consecutively therapeutic. Monitor CBC daily while on heparin infusion.   Will M. Dareen Piano, PharmD Clinical Pharmacist 11/02/2023 4:06 PM

## 2023-11-02 NOTE — ED Notes (Signed)
Wife states she will need to go home to take care of dogs shortly, wife w/ pt consent requests notification if pt is taken to cath lab tonight.

## 2023-11-02 NOTE — H&P (Addendum)
History and Physical    Patient: Kevin Reid MWN:[id number] DOB: Oct 04, 1961 DOA: 11/02/2023 DOS: the patient was seen and examined on 11/02/2023 PCP: Kandyce Rud, MD  Patient coming from: Home  Chief Complaint:  Chief Complaint  Patient presents with   Chest Pain   HPI: Kevin Reid is a 62 y.o. male with medical history significant of hypertension, hyperlipidemia, type 2 diabetes, OSA on CPAP, who presented to the ED due to chest pain.  Kevin Reid states that Kevin Reid was initially experiencing some chest pain earlier this fall around August when his PCP put him on Prilosec.  This chest pain resolved and Kevin Reid did not think much of it.  However in the last 1 week, Kevin Reid has had recurrence of chest pain that is substernal with radiation to both shoulders and arms.  Kevin Reid states that pain occasionally occurs at rest but is very severe with any exertion.  Kevin Reid has had difficulty climbing the stairs due to the symptoms.  Kevin Reid denies any diaphoresis, palpitations, shortness of breath or lower extremity swelling.  ED course: On arrival to the ED, patient was hypertensive at 164/96 with heart rate of 56.  Kevin Reid was saturating at 96% on room air.  Kevin Reid was afebrile at 97.8.  Initial workup demonstrated normal CBC, potassium of 3.2, glucose 117, BUN 26, creatinine 0.98 with GFR above 60.  Initial troponin elevated at 144 with mild increase up to 168.  Chest x-ray with no acute disease.  Kevin Reid was started on heparin infusion and cardiology was consulted.  [facility] contacted for admission.  Review of Systems: As mentioned in the history of present illness. All other systems reviewed and are negative.  Past Medical History:  Diagnosis Date   Allergy    Depression    Diabetes mellitus without complication (HCC)    GERD (gastroesophageal reflux disease)    Heart murmur    Hyperlipidemia    Hypertension    Joint pain    Sleep apnea    Past Surgical History:  Procedure Laterality Date   carpel tunnel      COLONOSCOPY WITH PROPOFOL N/A 06/21/2015   Procedure: COLONOSCOPY WITH PROPOFOL;  Surgeon: Christena Deem, MD;  Location[facility] ENDOSCOPY;  Service: Endoscopy;  Laterality: N/A;   COLONOSCOPY WITH PROPOFOL N/A 07/20/2015   Procedure: COLONOSCOPY WITH PROPOFOL;  Surgeon: Christena Deem, MD;  Location[facility] ENDOSCOPY;  Service: Endoscopy;  Laterality: N/A;   WISDOM TOOTH EXTRACTION     Social History:  reports that Kevin Reid quit smoking about 5 years ago. His smoking use included cigarettes. Kevin Reid started smoking about 13 years ago. Kevin Reid has a 8 pack-year smoking history. Kevin Reid quit smokeless tobacco use about 9 years ago.  His smokeless tobacco use included chew. Kevin Reid reports current alcohol use. Kevin Reid reports that Kevin Reid does not use drugs.  Allergies  Allergen Reactions   Codeine     unknown   Other Other (See Comments)   Simvastatin Other (See Comments)    Chest heaviness and numbness on and off.    Family History  Problem Relation Age of Onset   Prostate cancer Father 90   Hyperlipidemia Father    Cancer Maternal Grandmother    Heart attack Maternal Grandfather    Stroke Paternal Grandmother        old   Stomach cancer Paternal Grandfather     Prior to Admission medications   Medication Sig Start Date End Date Taking? Authorizing Provider  albuterol (VENTOLIN HFA) 108 (90 Base) MCG/ACT inhaler albuterol  sulfate HFA 90 mcg/actuation aerosol inhaler Patient not taking: Reported on 04/10/2023 05/16/21   [provider]  amLODipine (NORVASC) 10 MG tablet Take 1 tablet (10 mg total) by mouth daily. 06/29/20   Gweneth Dimitri, MD  atenolol (TENORMIN) 25 MG tablet Take 1 tablet by mouth daily. 03/15/23 03/14/24  [provider]  bisoprolol-hydrochlorothiazide (ZIAC) 5-6.25 MG tablet TAKE TWO TABLETS EVERY DAY Patient taking differently: Take 1 tablet by mouth daily. 07/07/20   Gweneth Dimitri, MD  Blood Glucose Monitoring Suppl (BLOOD GLUCOSE MONITOR SYSTEM) w/Device KIT Use to test blood sugar  once daily 04/06/20   Langston Reusing, MD  cetirizine (ZYRTEC) 10 MG tablet Take 1 tablet by mouth daily. Patient not taking: Reported on 03/22/2023    [provider]  fenofibrate (TRICOR) 145 MG tablet Take 1 tablet (145 mg total) by mouth daily. 01/26/20   Gweneth Dimitri, MD  fluticasone Aleda Grana) 50 MCG/ACT nasal spray fluticasone propionate 50 mcg/actuation nasal spray,suspension Patient not taking: Reported on 04/10/2023 12/06/21   [provider]  glucose blood (CONTOUR NEXT TEST) test strip Check sugar once daily. DX E11.9 02/05/20   Gweneth Dimitri, MD  Lancets MISC 1 each by Does not apply route daily as needed. 12/09/19   Gweneth Dimitri, MD  levocetirizine (XYZAL) 5 MG tablet Take 5 mg by mouth every evening.    [provider]  metFORMIN (GLUCOPHAGE) 1000 MG tablet Take 1 tablet (1,000 mg total) by mouth 2 (two) times daily. 11/29/20   Gweneth Dimitri, MD  MOUNJARO 5 MG/0.5ML Pen INJECT 0.5 MLS (5 MG TOTAL) SUBCUTANEOUSLY EVERY 7 (SEVEN) DAYS 03/02/23   [provider]  NON FORMULARY CPAP MACHINE USING EVERY NIGHT setting at 10    [provider]  PARoxetine (PAXIL) 20 MG tablet TAKE 3 TABLETS BY MOUTH ONCE DAILY Patient taking differently: Take 60 mg by mouth daily. 07/08/20   Gweneth Dimitri, MD  Semaglutide,0.25 or 0.5MG /DOS, (OZEMPIC, 0.25 OR 0.5 MG/DOSE,) 2 MG/1.5ML SOPN INJECT 0.5 MG INTO THE SKIN ONCE A WEEK 06/07/20   Gweneth Dimitri, MD  Vitamin D, Ergocalciferol, (DRISDOL) 1.25 MG (50000 UNIT) CAPS capsule Take 1 capsule (50,000 Units total) by mouth every 7 (seven) days. Patient not taking: Reported on 03/13/2023 04/06/20   Langston Reusing, MD    Physical Exam: Vitals:   11/02/23 1403 11/02/23 1700 11/02/23 1730 11/02/23 1816  BP:  126/88 (!) 139/92   Pulse:  (!) 51 (!) 51   Resp:  16 20   Temp:    97.8 F (36.6 C)  TempSrc:    Oral  SpO2:  95% 95%   Weight: 98.9 kg     Height: 5\' 10"  (1.778 m)      Physical Exam Vitals and  nursing note reviewed.  Constitutional:      General: Kevin Reid is not in acute distress.    Appearance: Kevin Reid is overweight. Kevin Reid is not toxic-appearing.  HENT:     Head: Normocephalic and atraumatic.  Eyes:     Extraocular Movements: Extraocular movements intact.     Pupils: Pupils are equal, round, and reactive to light.  Neck:     Vascular: No hepatojugular reflux or JVD.  Cardiovascular:     Rate and Rhythm: Regular rhythm. Bradycardia present.     Heart sounds: No murmur heard. Pulmonary:     Effort: Pulmonary effort is normal. No tachypnea or respiratory distress.     Breath sounds: Normal breath sounds.  Abdominal:     Palpations: Abdomen  is soft.     Tenderness: There is no abdominal tenderness.  Musculoskeletal:     Right lower leg: No edema.     Left lower leg: No edema.  Skin:    General: Skin is warm and dry.  Neurological:     General: No focal deficit present.     Mental Status: Kevin Reid is alert and oriented to person, place, and time.  Psychiatric:        Mood and Affect: Mood normal.        Behavior: Behavior normal.    Data Reviewed: CBC with WBC of 6.2 hemoglobin of 14.2, platelets of 184 BMP with sodium of 132, potassium 3.2, bicarb 24, glucose 117, BUN 26, creatinine 0.98 and GFR above 60 Initial troponin of 144 with increased to 168  EKG personally reviewed.  Sinus rhythm with rate of 55.  T wave inversion in V2, and V4 through 6.  No ST elevation or depression.  Compared to EKG obtained in April 2024, rate is unchanged however T wave changes are new.  DG Chest Portable 1 View  Result Date: 11/02/2023 CLINICAL DATA:  Chest pain EXAM: PORTABLE CHEST 1 VIEW COMPARISON:  X-ray 07/04/2021 FINDINGS: No consolidation, pneumothorax or effusion. No edema. Normal cardiopericardial silhouette when adjusting for technique. Left shoulder arthroplasty. IMPRESSION: No acute cardiopulmonary disease. Electronically Signed   By: Karen Kays M.D.   On: 11/02/2023 17:23    There are no  new results to review at this time.  Assessment and Plan:  * NSTEMI (non-ST elevated myocardial infarction) [facility]) Patient is presenting with new onset chest pain for 1 week that is substernal with radiation to both arms.  Significantly worse with exertion.  Although not severe, notable troponin leak at 144 up to 168, with some nonspecific T wave inversions on the EKG.  Given patient's risk factors, Kevin Reid is at high risk for ACS/CAD.   - Telemetry monitoring - Cardiology consulted; appreciate their recommendations. EDP discussed case with Dr. Melton Alar - Heparin infusion per pharmacy dosing - Nitroglycerin sublingual as needed for chest pain - Aspirin 324 mg once followed by aspirin 81 mg daily - Start atorvastatin 80 mg daily - Avoid metoprolol for now given bradycardia - Echocardiogram ordered - A1c and lipid panel  Sleep apnea - CPAP at bedtime  Hypertension Patient has a long-term history of hypertension with generally above goal blood pressures at office visits.  - Hold home atenolol given bradycardia and HCTZ given possibility of cath during admission - Continue telmisartan (substituted as irbesartan given formulary)  Hyperlipemia - Start atorvastatin 80 mg daily - Lipid panel in the a.m.  Diabetes mellitus, type 2 (HCC) Last A1c of 6.3% in April 2024.  - Hold home antiglycemic agents - SSI, moderate  Advance Care Planning:   Code Status: Full Code verified by patient  Consults: Cardiology  Family Communication: No family at bedside  Severity of Illness: The appropriate patient status for this patient is INPATIENT. Inpatient status is judged to be reasonable and necessary in order to provide the required intensity of service to ensure the patient's safety. The patient's presenting symptoms, physical exam findings, and initial radiographic and laboratory data in the context of their chronic comorbidities is felt to place them at high risk for further clinical deterioration.  Furthermore, it is not anticipated that the patient will be medically stable for discharge from the hospital within 2 midnights of admission.   * I certify that at the point of admission it is my  clinical judgment that the patient will require inpatient hospital care spanning beyond 2 midnights from the point of admission due to high intensity of service, high risk for further deterioration and high frequency of surveillance required.*  Author: Verdene Lennert, MD 11/02/2023 7:50 PM  For on call review [email or web address].

## 2023-11-02 NOTE — Assessment & Plan Note (Signed)
-   CPAP at bedtime 

## 2023-11-02 NOTE — Assessment & Plan Note (Signed)
LDL 74 on high-dose Lipitor.

## 2023-11-02 NOTE — ED Triage Notes (Signed)
Pt sts that he has been having chest pain with burning. Pt sts that he was at his PCP and was given omeprazole but it is not helping any more. Pt sts that he PCP recently increased the dosage a couple of days ago.

## 2023-11-03 DIAGNOSIS — E1169 Type 2 diabetes mellitus with other specified complication: Secondary | ICD-10-CM | POA: Diagnosis not present

## 2023-11-03 DIAGNOSIS — E669 Obesity, unspecified: Secondary | ICD-10-CM | POA: Diagnosis not present

## 2023-11-03 DIAGNOSIS — G4733 Obstructive sleep apnea (adult) (pediatric): Secondary | ICD-10-CM

## 2023-11-03 DIAGNOSIS — I214 Non-ST elevation (NSTEMI) myocardial infarction: Secondary | ICD-10-CM | POA: Diagnosis not present

## 2023-11-03 DIAGNOSIS — I10 Essential (primary) hypertension: Secondary | ICD-10-CM

## 2023-11-03 DIAGNOSIS — E785 Hyperlipidemia, unspecified: Secondary | ICD-10-CM

## 2023-11-03 LAB — CBG MONITORING, ED
Glucose-Capillary: 109 mg/dL — ABNORMAL HIGH (ref 70–99)
Glucose-Capillary: 91 mg/dL (ref 70–99)
Glucose-Capillary: 155 mg/dL — ABNORMAL HIGH (ref 70–99)

## 2023-11-03 LAB — BASIC METABOLIC PANEL
Anion gap: 9 (ref 5–15)
BUN: 27 mg/dL — ABNORMAL HIGH (ref 8–23)
CO2: 25 mmol/L (ref 22–32)
Calcium: 8.8 mg/dL — ABNORMAL LOW (ref 8.9–10.3)
Chloride: 102 mmol/L (ref 98–111)
Creatinine, Ser: 1.02 mg/dL (ref 0.61–1.24)
GFR, Estimated: >60 mL/min (ref >60)
Glucose, Bld: 120 mg/dL — ABNORMAL HIGH (ref 70–99)
Potassium: 3.6 mmol/L (ref 3.5–5.1)
Sodium: 136 mmol/L (ref 135–145)

## 2023-11-03 LAB — CBC
HCT: 40.7 % (ref 39.0–52.0)
Hemoglobin: 14.2 g/dL (ref 13.0–17.0)
MCH: 31.1 pg (ref 26.0–34.0)
MCHC: 34.9 g/dL (ref 30.0–36.0)
MCV: 89.1 fL (ref 80.0–100.0)
Platelets: 150 10*3/uL (ref 150–400)
RBC: 4.57 MIL/uL (ref 4.22–5.81)
RDW: 11.9 % (ref 11.5–15.5)
WBC: 5.4 10*3/uL (ref 4.0–10.5)
nRBC: 0 % (ref 0.0–0.2)

## 2023-11-03 LAB — HEPARIN LEVEL (UNFRACTIONATED)
Heparin Unfractionated: 0.16 [IU]/mL — ABNORMAL LOW (ref 0.30–0.70)
Heparin Unfractionated: 0.26 [IU]/mL — ABNORMAL LOW (ref 0.30–0.70)
Heparin Unfractionated: 0.47 [IU]/mL (ref 0.30–0.70)
Heparin Unfractionated: 0.56 [IU]/mL (ref 0.30–0.70)

## 2023-11-03 LAB — LIPID PANEL
Cholesterol: 140 mg/dL (ref 0–200)
HDL: 31 mg/dL — ABNORMAL LOW (ref >40)
LDL Cholesterol: 74 mg/dL (ref 0–99)
Total CHOL/HDL Ratio: 4.5 {ratio}
Triglycerides: 176 mg/dL — ABNORMAL HIGH (ref <150)
VLDL: 35 mg/dL (ref 0–40)

## 2023-11-03 LAB — TROPONIN I (HIGH SENSITIVITY): Troponin I (High Sensitivity): 181 ng/L (ref <18)

## 2023-11-03 LAB — HIV ANTIBODY (ROUTINE TESTING W REFLEX): HIV Screen 4th Generation wRfx: NONREACTIVE

## 2023-11-03 MED ORDER — HEPARIN BOLUS VIA INFUSION
1400.0000 [IU] | Freq: Once | INTRAVENOUS | Status: AC
  Administered 2023-11-03: 1400 [IU] via INTRAVENOUS
  Filled 2023-11-03: qty 1400

## 2023-11-03 MED ORDER — HYDROCHLOROTHIAZIDE 12.5 MG PO TABS
12.5000 mg | ORAL_TABLET | Freq: Every day | ORAL | Status: DC
  Administered 2023-11-03: 12.5 mg via ORAL
  Filled 2023-11-03: qty 1

## 2023-11-03 MED ORDER — HEPARIN BOLUS VIA INFUSION
2800.0000 [IU] | Freq: Once | INTRAVENOUS | Status: AC
  Administered 2023-11-03: 2800 [IU] via INTRAVENOUS
  Filled 2023-11-03: qty 2800

## 2023-11-03 NOTE — ED Notes (Signed)
ED TO INPATIENT HANDOFF REPORT  ED Nurse Name and Phone #: Virgilio Belling Name/Age/Gender Kevin Reid 62 y.o. male Room/Bed: ED33A/ED33A  Code Status   Code Status: Full Code  Home/SNF/Other Home Patient oriented to: self, place, time, and situation Is this baseline? Yes   Triage Complete: Triage complete  Chief Complaint NSTEMI (non-ST elevated myocardial infarction) [facility]) [I21.4]  Triage Note Pt comes via [facility] with 2 months of burning cp. Pt states worse after eating. Pt states no relief with meds. Pt states worse with exertion.  Pt sts that he has been having chest pain with burning. Pt sts that he was at his PCP and was given omeprazole but it is not helping any more. Pt sts that he PCP recently increased the dosage a couple of days ago.   Allergies Allergies  Allergen Reactions   Codeine     unknown   Other Other (See Comments)   Simvastatin Other (See Comments)    Chest heaviness and numbness on and off.    Level of Care/Admitting Diagnosis ED Disposition     ED Disposition  Admit   Condition  --   Comment  Hospital Area: [facility] [[id number]]  Level of Care: Telemetry Cardiac [[id number]]  Covid Evaluation: Asymptomatic - no recent exposure (last 10 days) testing not required  Diagnosis: NSTEMI (non-ST elevated myocardial infarction) [facility]) [[id number]]  Admitting Physician: Verdene Lennert [[id number]]  Attending Physician: Verdene Lennert [phone]  Certification:: I certify this patient will need inpatient services for at least 2 midnights  Expected Medical Readiness: 11/09/2023          B Medical/Surgery History Past Medical History:  Diagnosis Date   Allergy    Depression    Diabetes mellitus without complication (HCC)    GERD (gastroesophageal reflux disease)    Heart murmur    Hyperlipidemia    Hypertension    Joint pain    Sleep apnea    Past Surgical History:  Procedure Laterality Date   carpel tunnel     COLONOSCOPY  WITH PROPOFOL N/A 06/21/2015   Procedure: COLONOSCOPY WITH PROPOFOL;  Surgeon: Christena Deem, MD;  Location: [facility] ENDOSCOPY;  Service: Endoscopy;  Laterality: N/A;   COLONOSCOPY WITH PROPOFOL N/A 07/20/2015   Procedure: COLONOSCOPY WITH PROPOFOL;  Surgeon: Christena Deem, MD;  Location: [facility] ENDOSCOPY;  Service: Endoscopy;  Laterality: N/A;   WISDOM TOOTH EXTRACTION       A IV Location/Drains/Wounds Patient Lines/Drains/Airways Status     Active Line/Drains/Airways     Name Placement date Placement time Site Days   Peripheral IV 11/02/23 20 G Anterior;Distal;Right;Upper Arm 11/02/23  1443  Arm  1   Peripheral IV 11/03/23 20 G Anterior;Left Forearm 11/03/23  0105  Forearm  less than 1            Intake/Output Last 24 hours  Intake/Output Summary (Last 24 hours) at 11/03/2023 1828 Last data filed at 11/03/2023 1415 Gross per 24 hour  Intake --  Output 2150 ml  Net -2150 ml    Labs/Imaging Results for orders placed or performed during the hospital encounter of 11/02/23 (from the past 48 hour(s))  Troponin I (High Sensitivity)     Status: Abnormal   Collection Time: 11/02/23  2:30 PM  Result Value Ref Range   Troponin I (High Sensitivity) 144 (HH) <18 ng/L    Comment: CRITICAL RESULT CALLED TO, READ BACK BY AND VERIFIED WITH BRANDY LIGHT @1516  11/02/23 MJU (NOTE) Elevated high sensitivity  troponin I (hsTnI) values and significant  changes across serial measurements may suggest ACS but many other  chronic and acute conditions are known to elevate hsTnI results.  Refer to the "Links" section for chest pain algorithms and additional  guidance. Performed at [facility], [address] [place], [place] [address]   CBC with Differential     Status: None   Collection Time: 11/02/23  2:30 PM  Result Value Ref Range   WBC 6.2 4.0 - 10.5 K/uL   RBC 4.63 4.22 - 5.81 MIL/uL   Hemoglobin 14.2 13.0 - 17.0 g/dL   HCT 60.4 54.0 - 98.1 %   MCV 86.6 80.0 - 100.0 fL    MCH 30.7 26.0 - 34.0 pg   MCHC 35.4 30.0 - 36.0 g/dL   RDW 19.1 47.8 - 29.5 %   Platelets 184 150 - 400 K/uL   nRBC 0.0 0.0 - 0.2 %   Neutrophils Relative % 67 %   Neutro Abs 4.2 1.7 - 7.7 K/uL   Lymphocytes Relative 22 %   Lymphs Abs 1.3 0.7 - 4.0 K/uL   Monocytes Relative 8 %   Monocytes Absolute 0.5 0.1 - 1.0 K/uL   Eosinophils Relative 2 %   Eosinophils Absolute 0.1 0.0 - 0.5 K/uL   Basophils Relative 1 %   Basophils Absolute 0.1 0.0 - 0.1 K/uL   Immature Granulocytes 0 %   Abs Immature Granulocytes 0.02 0.00 - 0.07 K/uL    Comment: Performed at [facility], [address], [place], [place] [address]  Basic metabolic panel     Status: Abnormal   Collection Time: 11/02/23  2:30 PM  Result Value Ref Range   Sodium 132 (L) 135 - 145 mmol/L   Potassium 3.2 (L) 3.5 - 5.1 mmol/L   Chloride 98 98 - 111 mmol/L   CO2 24 22 - 32 mmol/L   Glucose, Bld 117 (H) 70 - 99 mg/dL    Comment: Glucose reference range applies only to samples taken after fasting for at least 8 hours.   BUN 26 (H) 8 - 23 mg/dL   Creatinine, Ser 8.65 0.61 - 1.24 mg/dL   Calcium 9.0 8.9 - 78.4 mg/dL   GFR, Estimated >69 >62 mL/min    Comment: (NOTE) Calculated using the CKD-EPI Creatinine Equation (2021)    Anion gap 10 5 - 15    Comment: Performed at [facility], [address] [place], [place] [address]  Hemoglobin A1c     Status: None   Collection Time: 11/02/23  2:30 PM  Result Value Ref Range   Hgb A1c MFr Bld 5.4 4.8 - 5.6 %    Comment: (NOTE) Pre diabetes:          5.7%-6.4%  Diabetes:              >6.4%  Glycemic control for   <7.0% adults with diabetes    Mean Plasma Glucose 108.28 mg/dL    Comment: Performed at [facility], [address] [place], [place] [address]  Troponin I (High Sensitivity)     Status: Abnormal   Collection Time: 11/02/23  4:59 PM  Result Value Ref Range   Troponin I (High Sensitivity) 168 (HH) <18 ng/L    Comment: CRITICAL VALUE NOTED.  VALUE IS CONSISTENT WITH PREVIOUSLY REPORTED/CALLED VALUE MJU (NOTE) Elevated high sensitivity troponin I (hsTnI) values and significant  changes across serial measurements may suggest ACS but many other  chronic and acute conditions  are known to elevate hsTnI results.  Refer to the "Links" section for chest pain algorithms and additional  guidance. Performed at [facility], [address] [place], [place] [address]   APTT     Status: None   Collection Time: 11/02/23  4:59 PM  Result Value Ref Range   aPTT 31 24 - 36 seconds    Comment: Performed at [facility], [address] [place], [place] [address]  Protime-INR     Status: None   Collection Time: 11/02/23  4:59 PM  Result Value Ref Range   Prothrombin Time 13.7 11.4 - 15.2 seconds   INR 1.0 0.8 - 1.2    Comment: (NOTE) INR goal varies based on device and disease states. Performed at [facility], [address] [place], [place] [address]   TSH     Status: None   Collection Time: 11/02/23  4:59 PM  Result Value Ref Range   TSH 3.115 0.350 - 4.500 uIU/mL    Comment: Performed by a 3rd Generation assay with a functional sensitivity of <=0.01 uIU/mL. Performed at [facility], [address] [place], [place] [address]   CBG monitoring, ED     Status: None   Collection Time: 11/02/23 10:24 PM  Result Value Ref Range   Glucose-Capillary 89 70 - 99 mg/dL    Comment: Glucose reference range applies only to samples taken after fasting for at least 8 hours.  Heparin level (unfractionated)     Status: None   Collection Time: 11/03/23 12:28 AM  Result Value Ref Range   Heparin Unfractionated 0.56 0.30 - 0.70 IU/mL    Comment: (NOTE) The clinical reportable range upper limit is being lowered to >1.10 to align with the FDA approved guidance for the current laboratory assay.  If heparin results are below expected values, and patient dosage has  been confirmed, suggest follow up  testing of antithrombin III levels. Performed at [facility], [address] [place], [place] [address]   Troponin I (High Sensitivity)     Status: Abnormal   Collection Time: 11/03/23 12:50 AM  Result Value Ref Range   Troponin I (High Sensitivity) 181 (HH) <18 ng/L    Comment: CRITICAL VALUE NOTED. VALUE IS CONSISTENT WITH PREVIOUSLY REPORTED/CALLED VALUE LFD (NOTE) Elevated high sensitivity troponin I (hsTnI) values and significant  changes across serial measurements may suggest ACS but many other  chronic and acute conditions are known to elevate hsTnI results.  Refer to the "Links" section for chest pain algorithms and additional  guidance. Performed at [facility], [address] [place], [place] [address]   CBC     Status: None   Collection Time: 11/03/23  5:01 AM  Result Value Ref Range   WBC 5.4 4.0 - 10.5 K/uL   RBC 4.57 4.22 - 5.81 MIL/uL   Hemoglobin 14.2 13.0 - 17.0 g/dL   HCT 07.3 71.0 - 62.6 %   MCV 89.1 80.0 - 100.0 fL   MCH 31.1 26.0 - 34.0 pg   MCHC 34.9 30.0 - 36.0 g/dL   RDW 94.8 54.6 - 27.0 %   Platelets 150 150 - 400 K/uL   nRBC 0.0 0.0 - 0.2 %    Comment: Performed at [facility], [address] [place], [place] [address]  HIV Antibody (routine testing w rflx)     Status: None   Collection Time: 11/03/23  5:01 AM  Result Value Ref Range   HIV Screen  4th Generation wRfx Non Reactive Non Reactive    Comment: Performed at [facility], [address] [place], [place] [address]  Basic metabolic panel     Status: Abnormal   Collection Time: 11/03/23  5:01 AM  Result Value Ref Range   Sodium 136 135 - 145 mmol/L   Potassium 3.6 3.5 - 5.1 mmol/L   Chloride 102 98 - 111 mmol/L   CO2 25 22 - 32 mmol/L   Glucose, Bld 120 (H) 70 - 99 mg/dL    Comment: Glucose reference range applies only to samples taken after fasting for at least 8 hours.   BUN 27 (H) 8 - 23 mg/dL   Creatinine, Ser 3.47 0.61 - 1.24 mg/dL    Calcium 8.8 (L) 8.9 - 10.3 mg/dL   GFR, Estimated >42 >59 mL/min    Comment: (NOTE) Calculated using the CKD-EPI Creatinine Equation (2021)    Anion gap 9 5 - 15    Comment: Performed at [facility], [address] [place], [place] [address]  Lipid panel     Status: Abnormal   Collection Time: 11/03/23  5:01 AM  Result Value Ref Range   Cholesterol 140 0 - 200 mg/dL   Triglycerides 564 (H) <150 mg/dL   HDL 31 (L) >33 mg/dL   Total CHOL/HDL Ratio 4.5 RATIO   VLDL 35 0 - 40 mg/dL   LDL Cholesterol 74 0 - 99 mg/dL    Comment:        Total Cholesterol/HDL:CHD Risk Coronary Heart Disease Risk Table                     Men   Women  1/2 Average Risk   3.4   3.3  Average Risk       5.0   4.4  2 X Average Risk   9.6   7.1  3 X Average Risk  23.4   11.0        Use the calculated Patient Ratio above and the CHD Risk Table to determine the patient's CHD Risk.        ATP III CLASSIFICATION (LDL):  <100     mg/dL   Optimal  295-188  mg/dL   Near or Above                    Optimal  130-159  mg/dL   Borderline  416-606  mg/dL   High  >301     mg/dL   Very High Performed at [facility], [address] [place], [place] [address]   Heparin level (unfractionated)     Status: Abnormal   Collection Time: 11/03/23  5:01 AM  Result Value Ref Range   Heparin Unfractionated 0.16 (L) 0.30 - 0.70 IU/mL    Comment: (NOTE) The clinical reportable range upper limit is being lowered to >1.10 to align with the FDA approved guidance for the current laboratory assay.  If heparin results are below expected values, and patient dosage has  been confirmed, suggest follow up testing of antithrombin III levels. Performed at [facility], [address] [place], [place] [address]   CBG monitoring, ED     Status: Abnormal   Collection Time: 11/03/23  7:29 AM  Result Value Ref Range   Glucose-Capillary 109 (H) 70 - 99 mg/dL    Comment: Glucose reference range applies  only to samples taken after fasting for at least 8 hours.  CBG monitoring, ED  Status: None   Collection Time: 11/03/23 11:20 AM  Result Value Ref Range   Glucose-Capillary 91 70 - 99 mg/dL    Comment: Glucose reference range applies only to samples taken after fasting for at least 8 hours.  Heparin level (unfractionated)     Status: None   Collection Time: 11/03/23  1:04 PM  Result Value Ref Range   Heparin Unfractionated 0.47 0.30 - 0.70 IU/mL    Comment: (NOTE) The clinical reportable range upper limit is being lowered to >1.10 to align with the FDA approved guidance for the current laboratory assay.  If heparin results are below expected values, and patient dosage has  been confirmed, suggest follow up testing of antithrombin III levels. Performed at [facility], [address] [place], [place] [address]   CBG monitoring, ED     Status: Abnormal   Collection Time: 11/03/23  4:29 PM  Result Value Ref Range   Glucose-Capillary 155 (H) 70 - 99 mg/dL    Comment: Glucose reference range applies only to samples taken after fasting for at least 8 hours.   DG Chest Portable 1 View  Result Date: 11/02/2023 CLINICAL DATA:  Chest pain EXAM: PORTABLE CHEST 1 VIEW COMPARISON:  X-ray 07/04/2021 FINDINGS: No consolidation, pneumothorax or effusion. No edema. Normal cardiopericardial silhouette when adjusting for technique. Left shoulder arthroplasty. IMPRESSION: No acute cardiopulmonary disease. Electronically Signed   By: Karen Kays M.D.   On: 11/02/2023 17:23    Pending Labs Unresulted Labs (From admission, onward)     Start     Ordered   11/04/23 0500  CBC  Tomorrow morning,   R        11/03/23 1424   11/03/23 1900  Heparin level (unfractionated)  Once-Timed,   TIMED        11/03/23 1424   11/03/23 0500  Lipoprotein A (LPA)  Tomorrow morning,   R        11/02/23 1748            Vitals/Pain Today's Vitals   11/03/23 1630 11/03/23 1700 11/03/23 1730 11/03/23  1800  BP: (!) 152/93 (!) 161/100 (!) 167/98 (!) 175/108  Pulse: 66 67 63 77  Resp: 13 17 18 16   Temp:      TempSrc:      SpO2: 97% 94% 98% 94%  Weight:      Height:      PainSc:        Isolation Precautions No active isolations  Medications Medications  nitroGLYCERIN (NITROSTAT) SL tablet 0.4 mg (0.4 mg Sublingual Given 11/02/23 1524)  heparin ADULT infusion 100 units/mL (25000 units/2103mL) (1,600 Units/hr Intravenous New Bag/Given 11/03/23 0735)  aspirin EC tablet 81 mg (81 mg Oral Given 11/03/23 0918)  acetaminophen (TYLENOL) tablet 650 mg (has no administration in time range)  ondansetron (ZOFRAN) injection 4 mg (has no administration in time range)  atorvastatin (LIPITOR) tablet 80 mg (80 mg Oral Given 11/03/23 0918)  insulin aspart (novoLOG) injection 0-15 Units (3 Units Subcutaneous Given 11/03/23 1632)  fenofibrate tablet 160 mg (160 mg Oral Given 11/03/23 0918)  irbesartan (AVAPRO) tablet 300 mg (300 mg Oral Given 11/03/23 0919)  PARoxetine (PAXIL) tablet 60 mg (60 mg Oral Given 11/03/23 0919)  hydrochlorothiazide (HYDRODIURIL) tablet 12.5 mg (12.5 mg Oral Given 11/03/23 1415)  aspirin chewable tablet 324 mg (324 mg Oral Given 11/02/23 1523)  heparin bolus via infusion 4,000 Units (4,000 Units Intravenous Bolus from Bag 11/02/23 1701)  potassium chloride SA (KLOR-CON M) CR  tablet 40 mEq (40 mEq Oral Given 11/02/23 1908)  heparin bolus via infusion 2,800 Units (2,800 Units Intravenous Bolus from Bag 11/03/23 0641)    Mobility walks     Focused Assessments Cardiac Assessment Handoff:  Cardiac Rhythm: Sinus bradycardia Lab Results  Component Value Date   TROPONINI <0.03 07/07/2018   Lab Results  Component Value Date   DDIMER 0.42 01/02/2021   Does the Patient currently have chest pain? Yes    R Recommendations: See Admitting Provider Note  Report given to:   Additional Notes:

## 2023-11-03 NOTE — Consult Note (Signed)
Pharmacy Consult Note - Anticoagulation  Pharmacy Consult for heparin Indication: chest pain/ACS  PATIENT MEASUREMENTS: Height: 5\' 10"  (177.8 cm) Weight: 98.9 kg (218 lb) IBW/kg (Calculated) : 73 HEPARIN DW (KG): 93.5  VITAL SIGNS: Temp: 98 F (36.7 C) (11/09 0300) Temp Source: Oral (11/09 0300) BP: 134/79 (11/09 0300) Pulse Rate: 54 (11/09 0300)  Recent Labs    11/02/23 1659 11/03/23 0028 11/03/23 0050 11/03/23 0501  HGB  --   --   --  14.2  HCT  --   --   --  40.7  PLT  --   --   --  150  APTT 31  --   --   --   LABPROT 13.7  --   --   --   INR 1.0  --   --   --   HEPARINUNFRC  --    < >  --  0.16*  CREATININE  --   --   --  1.02  TROPONINIHS 168*  --  181*  --    < > = values in this interval not displayed.    Estimated Creatinine Clearance: 89.7 mL/min (by C-G formula based on SCr of 1.02 mg/dL).  PAST MEDICAL HISTORY: Past Medical History:  Diagnosis Date   Allergy    Depression    Diabetes mellitus without complication (HCC)    GERD (gastroesophageal reflux disease)    Heart murmur    Hyperlipidemia    Hypertension    Joint pain    Sleep apnea     ASSESSMENT: 62 y.o. male with PMH including GERD, HTN is presenting with exertional chest pain. Patient is not on chronic anticoagulation per chart review. ECG exhibiting possible ST-T changes and cTn elevated at 144. Pharmacy has been consulted to initiate and manage heparin intravenous infusion.  Pertinent medications: No chronic anticoagulation PTA per chart review  Goal(s) of therapy: Heparin level 0.3 - 0.7 units/mL Monitor platelets by anticoagulation protocol: Yes   Baseline anticoagulation labs: Recent Labs    11/02/23 1430 11/02/23 1659 11/03/23 0501  APTT  --  31  --   INR  --  1.0  --   HGB 14.2  --  14.2  PLT 184  --  150    Date Time aPTT/HL Rate/Comment 11/9     0028      0.56               therapeutic X 1  11/9     0501      0.16               SUBtherapeutic       PLAN: 11/9:  HL @ 0501 = 0.16, SUBtherapeutic - RN says heparin gtt has been running all night without any interuptions - Will order heparin 2800 units IV X 1 bolus and increase drip rate to 1600 units/hr - Will recheck HL 6 hrs after rate change   Palmer Shorey D Clinical Pharmacist 11/03/2023 6:34 AM

## 2023-11-03 NOTE — Progress Notes (Signed)
Progress Note   Patient: Kevin Reid GLO:[id number] DOB: 1961-06-22 DOA: 11/02/2023     1 DOS: the patient was seen and examined on 11/03/2023   Brief hospital course: 62 year old man past medical history of hypertension hyperlipidemia type 2 diabetes mellitus, sleep apnea on CPAP presents to the ED secondary to chest pain.  For months patient has been having some discomfort.  In August the PCP put him on Prilosec which seemed to help a little bit but patient continued having chest pains on and off recently having chest pain going up a flight of stairs.  He came to the ER and found to have an elevated troponin and started on heparin infusion.  11/9.  Patient still has some intermittent chest pain.  Troponin went up to 181.  Continue heparin drip and aspirin and Lipitor.  Assessment and Plan: * NSTEMI (non-ST elevated myocardial infarction) (HCC) Last troponin up at 181.  Continue heparin drip.  Continue aspirin.  Heart rate on the lower side so beta-blocker currently contraindicated.  Continue Lipitor.  Cardiology to determine plan.  Hypertension Bisoprolol on hold with bradycardia.  Patient on Avapro.  With blood pressure being on the high side will add hydrochlorothiazide.  Type 2 diabetes mellitus with hyperlipidemia (HCC) Last A1c of 6.3% in April 2024.  Continue sliding scale insulin.  Last 3 sugars 89, 109 and 91.  LDL 74.  Continue high-dose Lipitor.  Obesity (BMI 30-39.9) BMI 31.28  Sleep apnea - CPAP at bedtime        Subjective: Patient having some pains going on for months.  More recently having severe pains going up a flight of stairs.  Found to have an elevated troponin and started on heparin drip for NSTEMI.  Physical Exam: Vitals:   11/03/23 0930 11/03/23 1000 11/03/23 1030 11/03/23 1125  BP: (!) 164/94 (!) 162/95 (!) 176/97   Pulse: (!) 52 (!) 59 (!) 58   Resp: 13 15 15    Temp:    97.9 F (36.6 C)  TempSrc:    Oral  SpO2: 97% 97% 94%   Weight:       Height:       Physical Exam HENT:     Head: Normocephalic.     Mouth/Throat:     Pharynx: No oropharyngeal exudate.  Eyes:     General: Lids are normal.     Conjunctiva/sclera: Conjunctivae normal.  Cardiovascular:     Rate and Rhythm: Normal rate and regular rhythm.     Heart sounds: Normal heart sounds, S1 normal and S2 normal.  Pulmonary:     Breath sounds: No decreased breath sounds, wheezing, rhonchi or rales.  Abdominal:     Palpations: Abdomen is soft.     Tenderness: There is no abdominal tenderness.  Musculoskeletal:     Right lower leg: No swelling.     Left lower leg: No swelling.  Skin:    General: Skin is warm.     Findings: No rash.  Neurological:     Mental Status: He is alert and oriented to person, place, and time.     Data Reviewed: Creatinine 1.02, LDL 74,  platelet count 150  Family Communication: Family at bedside  Disposition: Status is: Inpatient Remains inpatient appropriate because: Continue heparin drip.  Cardiology to set up plan likely will end up needing a cardiac cath on Monday.  Planned Discharge Destination: Home    Time spent: 28 minutes  Author: Alford Highland, MD 11/03/2023 1:38 PM  For on call review  [email or web address]

## 2023-11-03 NOTE — ED Notes (Signed)
Grace Bushy M.D. and left voicemail regarding pt's BP and requested PRN HTN medication

## 2023-11-03 NOTE — Hospital Course (Signed)
62 year old man past medical history of hypertension hyperlipidemia type 2 diabetes mellitus, sleep apnea on CPAP presents to the ED secondary to chest pain.  For months patient has been having some discomfort.  In August the PCP put him on Prilosec which seemed to help a little bit but patient continued having chest pains on and off recently having chest pain going up a flight of stairs.  He came to the ER and found to have an elevated troponin and started on heparin infusion.  Cardiac cath was performed on 11/12, showed two-vessel disease involving 95% LAD, RCA 70% proximal, 100% distal.  Patient had a drug-eluting stent placed into RCA on 11/12.  Treated with aspirin and Brilinta. Scheduled LAD stent 11/13.

## 2023-11-03 NOTE — Consult Note (Signed)
Pharmacy Consult Note - Anticoagulation  Pharmacy Consult for heparin Indication: chest pain/ACS  PATIENT MEASUREMENTS: Height: 5\' 10"  (177.8 cm) Weight: 98.9 kg (218 lb) IBW/kg (Calculated) : 73 HEPARIN DW (KG): 93.5  VITAL SIGNS: Temp: 98.1 F (36.7 C) (11/09 1511) Temp Source: Oral (11/09 1511) BP: 157/100 (11/09 2000) Pulse Rate: 65 (11/09 2000)  Recent Labs    11/02/23 1659 11/03/23 0028 11/03/23 0050 11/03/23 0501 11/03/23 1304 11/03/23 1955  HGB  --   --   --  14.2  --   --   HCT  --   --   --  40.7  --   --   PLT  --   --   --  150  --   --   APTT 31  --   --   --   --   --   LABPROT 13.7  --   --   --   --   --   INR 1.0  --   --   --   --   --   HEPARINUNFRC  --    < >  --  0.16*   < > 0.26*  CREATININE  --   --   --  1.02  --   --   TROPONINIHS 168*  --  181*  --   --   --    < > = values in this interval not displayed.    Estimated Creatinine Clearance: 89.7 mL/min (by C-G formula based on SCr of 1.02 mg/dL).  PAST MEDICAL HISTORY: Past Medical History:  Diagnosis Date   Allergy    Depression    Diabetes mellitus without complication (HCC)    GERD (gastroesophageal reflux disease)    Heart murmur    Hyperlipidemia    Hypertension    Joint pain    Sleep apnea     ASSESSMENT: 61 y.o. male with PMH including GERD, HTN is presenting with exertional chest pain. Patient is not on chronic anticoagulation per chart review. ECG exhibiting possible ST-T changes and cTn elevated at 144. Pharmacy has been consulted to initiate and manage heparin intravenous infusion.  Pertinent medications: No chronic anticoagulation PTA per chart review  Goal(s) of therapy: Heparin level 0.3 - 0.7 units/mL Monitor platelets by anticoagulation protocol: Yes   Baseline anticoagulation labs: Recent Labs    11/02/23 1430 11/02/23 1659 11/03/23 0501  APTT  --  31  --   INR  --  1.0  --   HGB 14.2  --  14.2  PLT 184  --  150     Date Time aPTT/HL Rate/Comment 11/9     0028      0.56               therapeutic X 1  11/9     0501      0.16               SUBtherapeutic  11/9 1304   0.47  Therapeutic x1 11/9 2044   0.26  SUBtherapeutic     PLAN: - Heparin level SUBtherapeutic  - Give 1400 unit bolus x 1 - Increase drip rate to 1800 units/hr - Will check HL in 6 hrs  - CBC daily  Paulita Fujita, PharmD Clinical Pharmacist 11/03/2023

## 2023-11-03 NOTE — Consult Note (Signed)
[facility] CLINIC CARDIOLOGY CONSULT NOTE       Patient ID: Kevin Reid MRN: [id number] DOB/AGE: Jun 02, 1961 62 y.o.  Admit date: 11/02/2023 Referring Physician Dr Renae Gloss Primary Physician Kandyce Rud, MD Primary Cardiologist Dr Juliann Pares Reason for Consultation NSTEMI  HPI: Kevin Reid is a 62 y.o. male  with a past medical history of hypertension and hyperlipidemia who presented to the ED on 11/02/2023 for chest pain. Patient noted to have elevated troponins with persistent chest pain. He has not seen cardiology in a few years now. Patient is agreeable to proceed with left heart cath with possible intervervention on Monday morning. He has stable chest pain at rest. Denies shortness of breath, palpitations, diaphoresis, syncope, edema, PND, orthopnea.   Review of systems complete and found to be negative unless listed above     Past Medical History:  Diagnosis Date   Allergy    Depression    Diabetes mellitus without complication (HCC)    GERD (gastroesophageal reflux disease)    Heart murmur    Hyperlipidemia    Hypertension    Joint pain    Sleep apnea     Past Surgical History:  Procedure Laterality Date   carpel tunnel     COLONOSCOPY WITH PROPOFOL N/A 06/21/2015   Procedure: COLONOSCOPY WITH PROPOFOL;  Surgeon: Christena Deem, MD;  Location: [facility] ENDOSCOPY;  Service: Endoscopy;  Laterality: N/A;   COLONOSCOPY WITH PROPOFOL N/A 07/20/2015   Procedure: COLONOSCOPY WITH PROPOFOL;  Surgeon: Christena Deem, MD;  Location: [facility] ENDOSCOPY;  Service: Endoscopy;  Laterality: N/A;   WISDOM TOOTH EXTRACTION      (Not in a hospital admission)  Social History   Socioeconomic History   Marital status: Married    Spouse name: Sherri   Number of children: Not on file   Years of education: high school   Highest education level: Not on file  Occupational History   Occupation: [employer] cabin crew  Tobacco Use   Smoking status: Former    Current  packs/day: 0.00    Average packs/day: 1 pack/day for 8.0 years (8.0 ttl pk-yrs)    Types: Cigarettes    Start date: 04/24/2010    Quit date: 04/24/2018    Years since quitting: 5.5   Smokeless tobacco: Former    Types: Chew    Quit date: 11/19/2013  Vaping Use   Vaping status: Never Used  Substance and Sexual Activity   Alcohol use: Yes    Comment: not regularly currently   Drug use: No   Sexual activity: Yes    Birth control/protection: None  Other Topics Concern   Not on file  Social History Narrative   Married to [employer]   Enjoys - shooting for fun, does not hunt as much anymore   Exercise- nothing regularly, needs to exercise   Diet - trying to eat healthy, follow the diabetic diet (avoid carbs)   Support system: wife   Social Determinants of Health   Financial Resource Strain: Low Risk  (11/19/2018)   Overall Financial Resource Strain (CARDIA)    Difficulty of Paying Living Expenses: Not hard at all  Food Insecurity: No Food Insecurity (10/18/2020)   Hunger Vital Sign    Worried About Running Out of Food in the Last Year: Never true    Ran Out of Food in the Last Year: Never true  Transportation Needs: No Transportation Needs (10/18/2020)   PRAPARE - Administrator, Civil Service (Medical): No    Lack of Transportation (  Non-Medical): No  Physical Activity: Not on file  Stress: Not on file  Social Connections: Not on file  Intimate Partner Violence: Not on file    Family History  Problem Relation Age of Onset   Prostate cancer Father 36   Hyperlipidemia Father    Cancer Maternal Grandmother    Heart attack Maternal Grandfather    Stroke Paternal Grandmother        old   Stomach cancer Paternal Grandfather      Vitals:   11/03/23 0930 11/03/23 1000 11/03/23 1030 11/03/23 1125  BP: (!) 164/94 (!) 162/95 (!) 176/97   Pulse: (!) 52 (!) 59 (!) 58   Resp: 13 15 15    Temp:    97.9 F (36.6 C)  TempSrc:    Oral  SpO2: 97% 97% 94%   Weight:       Height:        PHYSICAL EXAM General: awake, well nourished, in no acute distress. HEENT: Normocephalic and atraumatic. Neck: No JVD.  Lungs: Normal respiratory effort. Clear bilaterally to auscultation. No wheezes, crackles, rhonchi.  Heart: HRRR. Normal S1 and S2 without gallops or murmurs.  Abdomen: Non-distended appearing.  Msk: Normal strength and tone for age. Extremities: Warm and well perfused. No clubbing, cyanosis. no edema.  Neuro: Alert and oriented X 3. Psych: Answers questions appropriately.   Labs: Basic Metabolic Panel: Recent Labs    11/02/23 1430 11/03/23 0501  NA 132* 136  K 3.2* 3.6  CL 98 102  CO2 24 25  GLUCOSE 117* 120*  BUN 26* 27*  CREATININE 0.98 1.02  CALCIUM 9.0 8.8*   Liver Function Tests: No results for input(s): "AST", "ALT", "ALKPHOS", "BILITOT", "PROT", "ALBUMIN" in the last 72 hours. No results for input(s): "LIPASE", "AMYLASE" in the last 72 hours. CBC: Recent Labs    11/02/23 1430 11/03/23 0501  WBC 6.2 5.4  NEUTROABS 4.2  --   HGB 14.2 14.2  HCT 40.1 40.7  MCV 86.6 89.1  PLT 184 150   Cardiac Enzymes: Recent Labs    11/02/23 1430 11/02/23 1659 11/03/23 0050  TROPONINIHS 144* 168* 181*   BNP: No results for input(s): "BNP" in the last 72 hours. D-Dimer: No results for input(s): "DDIMER" in the last 72 hours. Hemoglobin A1C: Recent Labs    11/02/23 1430  HGBA1C 5.4   Fasting Lipid Panel: Recent Labs    11/03/23 0501  CHOL 140  HDL 31*  LDLCALC 74  TRIG 952*  CHOLHDL 4.5   Thyroid Function Tests: Recent Labs    11/02/23 1659  TSH 3.115   Anemia Panel: No results for input(s): "VITAMINB12", "FOLATE", "FERRITIN", "TIBC", "IRON", "RETICCTPCT" in the last 72 hours.   Radiology: DG Chest Portable 1 View  Result Date: 11/02/2023 CLINICAL DATA:  Chest pain EXAM: PORTABLE CHEST 1 VIEW COMPARISON:  X-ray 07/04/2021 FINDINGS: No consolidation, pneumothorax or effusion. No edema. Normal cardiopericardial  silhouette when adjusting for technique. Left shoulder arthroplasty. IMPRESSION: No acute cardiopulmonary disease. Electronically Signed   By: Karen Kays M.D.   On: 11/02/2023 17:23    ECHO pending  TELEMETRY reviewed by me [facility]) 11/03/2023 : NSR  EKG reviewed by me: Sinus bradycardia with eft axis deviation, voltage criteria for LVH. Cannot rule out Anterior infarct , age undetermined ST & T wave abnormality, consider lateral ischemia   Data reviewed by me [facility]) 11/03/2023: last 24h vitals tele labs imaging I/O provider notes  Principal Problem:   NSTEMI (non-ST elevated myocardial infarction) (HCC) Active  Problems:   Diabetes mellitus, type 2 (HCC)   Hyperlipemia   Hypertension   Sleep apnea    ASSESSMENT AND PLAN:  NSTEMI HTN HLD Schedule for cardiac catheterization, and possible angioplasty. We discussed regarding risks, benefits, alternatives to this including stress testing, CTA and continued medical therapy. Patient wants to proceed. Understands <1-2% risk of death, stroke, MI, urgent CABG, bleeding, infection, renal failure but not limited to these. Continue heparin gtt Will add nitroglycerin gtt for persistent chest pain. Restart home cardiac meds. Cath tentatively planned for Monday 11/11    Signed: [name], DO 11/03/2023, 11:52 AM [facility] Cardiology

## 2023-11-03 NOTE — Consult Note (Signed)
Pharmacy Consult Note - Anticoagulation  Pharmacy Consult for heparin Indication: chest pain/ACS  PATIENT MEASUREMENTS: Height: 5\' 10"  (177.8 cm) Weight: 98.9 kg (218 lb) IBW/kg (Calculated) : 73 HEPARIN DW (KG): 93.5  VITAL SIGNS: Temp: 97.9 F (36.6 C) (11/08 2213) Temp Source: Oral (11/08 2213) BP: 139/88 (11/08 2200) Pulse Rate: 50 (11/08 2222)  Recent Labs    11/02/23 1430 11/02/23 1659 11/03/23 0028  HGB 14.2  --   --   HCT 40.1  --   --   PLT 184  --   --   APTT  --  31  --   LABPROT  --  13.7  --   INR  --  1.0  --   HEPARINUNFRC  --   --  0.56  CREATININE 0.98  --   --   TROPONINIHS 144* 168*  --     Estimated Creatinine Clearance: 93.4 mL/min (by C-G formula based on SCr of 0.98 mg/dL).  PAST MEDICAL HISTORY: Past Medical History:  Diagnosis Date   Allergy    Depression    Diabetes mellitus without complication (HCC)    GERD (gastroesophageal reflux disease)    Heart murmur    Hyperlipidemia    Hypertension    Joint pain    Sleep apnea     ASSESSMENT: 62 y.o. male with PMH including GERD, HTN is presenting with exertional chest pain. Patient is not on chronic anticoagulation per chart review. ECG exhibiting possible ST-T changes and cTn elevated at 144. Pharmacy has been consulted to initiate and manage heparin intravenous infusion.  Pertinent medications: No chronic anticoagulation PTA per chart review  Goal(s) of therapy: Heparin level 0.3 - 0.7 units/mL Monitor platelets by anticoagulation protocol: Yes   Baseline anticoagulation labs: Recent Labs    11/02/23 1430 11/02/23 1659  APTT  --  31  INR  --  1.0  HGB 14.2  --   PLT 184  --     Date Time aPTT/HL Rate/Comment 11/9     0028      0.56               therapeutic X 1      PLAN: 11/9:  HL @ 0028 = 0.56, therapeutic X 1 - Will continue pt on current rate and recheck HL on 11/9 @ 0600  Tawyna Pellot D Clinical Pharmacist 11/03/2023 1:14 AM

## 2023-11-03 NOTE — Assessment & Plan Note (Signed)
BMI down to 29.24

## 2023-11-03 NOTE — Plan of Care (Signed)
  Problem: Education: Goal: Ability to describe self-care measures that may prevent or decrease complications (Diabetes Survival Skills Education) will improve Outcome: Progressing Goal: Individualized Educational Video(s) Outcome: Progressing   Problem: Coping: Goal: Ability to adjust to condition or change in health will improve Outcome: Progressing   Problem: Fluid Volume: Goal: Ability to maintain a balanced intake and output will improve Outcome: Progressing   Problem: Health Behavior/Discharge Planning: Goal: Ability to identify and utilize available resources and services will improve Outcome: Progressing Goal: Ability to manage health-related needs will improve Outcome: Progressing   Problem: Metabolic: Goal: Ability to maintain appropriate glucose levels will improve Outcome: Progressing   Problem: Nutritional: Goal: Maintenance of adequate nutrition will improve Outcome: Progressing Goal: Progress toward achieving an optimal weight will improve Outcome: Progressing   Problem: Skin Integrity: Goal: Risk for impaired skin integrity will decrease Outcome: Progressing   Problem: Tissue Perfusion: Goal: Adequacy of tissue perfusion will improve Outcome: Progressing   Problem: Education: Goal: Understanding of cardiac disease, CV risk reduction, and recovery process will improve Outcome: Progressing Goal: Individualized Educational Video(s) Outcome: Progressing   Problem: Activity: Goal: Ability to tolerate increased activity will improve Outcome: Progressing   Problem: Cardiac: Goal: Ability to achieve and maintain adequate cardiovascular perfusion will improve Outcome: Progressing   Problem: Health Behavior/Discharge Planning: Goal: Ability to safely manage health-related needs after discharge will improve Outcome: Progressing   Problem: Education: Goal: Knowledge of General Education information will improve Description: Including pain rating scale,  medication(s)/side effects and non-pharmacologic comfort measures Outcome: Progressing   Problem: Health Behavior/Discharge Planning: Goal: Ability to manage health-related needs will improve Outcome: Progressing   Problem: Clinical Measurements: Goal: Ability to maintain clinical measurements within normal limits will improve Outcome: Progressing Goal: Will remain free from infection Outcome: Progressing Goal: Diagnostic test results will improve Outcome: Progressing Goal: Respiratory complications will improve Outcome: Progressing Goal: Cardiovascular complication will be avoided Outcome: Progressing   Problem: Activity: Goal: Risk for activity intolerance will decrease Outcome: Progressing   Problem: Nutrition: Goal: Adequate nutrition will be maintained Outcome: Progressing   Problem: Coping: Goal: Level of anxiety will decrease Outcome: Progressing   Problem: Elimination: Goal: Will not experience complications related to bowel motility Outcome: Progressing Goal: Will not experience complications related to urinary retention Outcome: Progressing   Problem: Pain Management: Goal: General experience of comfort will improve Outcome: Progressing   Problem: Safety: Goal: Ability to remain free from injury will improve Outcome: Progressing   Problem: Skin Integrity: Goal: Risk for impaired skin integrity will decrease Outcome: Progressing

## 2023-11-03 NOTE — Consult Note (Signed)
Pharmacy Consult Note - Anticoagulation  Pharmacy Consult for heparin Indication: chest pain/ACS  PATIENT MEASUREMENTS: Height: 5\' 10"  (177.8 cm) Weight: 98.9 kg (218 lb) IBW/kg (Calculated) : 73 HEPARIN DW (KG): 93.5  VITAL SIGNS: Temp: 97.9 F (36.6 C) (11/09 1125) Temp Source: Oral (11/09 1125) BP: 157/92 (11/09 1400) Pulse Rate: 59 (11/09 1400)  Recent Labs    11/02/23 1659 11/03/23 0028 11/03/23 0050 11/03/23 0501 11/03/23 1304  HGB  --   --   --  14.2  --   HCT  --   --   --  40.7  --   PLT  --   --   --  150  --   APTT 31  --   --   --   --   LABPROT 13.7  --   --   --   --   INR 1.0  --   --   --   --   HEPARINUNFRC  --    < >  --  0.16* 0.47  CREATININE  --   --   --  1.02  --   TROPONINIHS 168*  --  181*  --   --    < > = values in this interval not displayed.    Estimated Creatinine Clearance: 89.7 mL/min (by C-G formula based on SCr of 1.02 mg/dL).  PAST MEDICAL HISTORY: Past Medical History:  Diagnosis Date   Allergy    Depression    Diabetes mellitus without complication (HCC)    GERD (gastroesophageal reflux disease)    Heart murmur    Hyperlipidemia    Hypertension    Joint pain    Sleep apnea     ASSESSMENT: 62 y.o. male with PMH including GERD, HTN is presenting with exertional chest pain. Patient is not on chronic anticoagulation per chart review. ECG exhibiting possible ST-T changes and cTn elevated at 144. Pharmacy has been consulted to initiate and manage heparin intravenous infusion.  Pertinent medications: No chronic anticoagulation PTA per chart review  Goal(s) of therapy: Heparin level 0.3 - 0.7 units/mL Monitor platelets by anticoagulation protocol: Yes   Baseline anticoagulation labs: Recent Labs    11/02/23 1430 11/02/23 1659 11/03/23 0501  APTT  --  31  --   INR  --  1.0  --   HGB 14.2  --  14.2  PLT 184  --  150    Date Time aPTT/HL Rate/Comment 11/9     0028      0.56               therapeutic X 1  11/9      0501      0.16               SUBtherapeutic  11/9 1304   0.47  Therapeutic x1     PLAN: 11/9 1304   HL=0.47  Therapeutic x1 - Will continue drip rate at 1600 units/hr - Will check confirmatory HL in 6 hrs  -CBC daily  Bari Mantis PharmD Clinical Pharmacist 11/03/2023

## 2023-11-03 NOTE — Progress Notes (Signed)
   11/02/23 2222  BiPAP/CPAP/SIPAP  $ Non-Invasive Home Ventilator  Initial  BiPAP/CPAP/SIPAP Pt Type Adult  Mask Type Nasal pillows  Respiratory Rate 17 breaths/min  EPAP  (HOME SETTINGS)  FiO2 (%) 21 %  Patient Home Equipment Yes  Auto Titrate Yes  Safety Check Completed by RT for Home Unit Yes, no issues noted   Patient on home CPAP unit, self manages. Home unit safety check complete, plugged into red outlet declines further assistance from RT at this time.

## 2023-11-04 DIAGNOSIS — I214 Non-ST elevation (NSTEMI) myocardial infarction: Secondary | ICD-10-CM | POA: Diagnosis not present

## 2023-11-04 DIAGNOSIS — I10 Essential (primary) hypertension: Secondary | ICD-10-CM | POA: Diagnosis not present

## 2023-11-04 DIAGNOSIS — E669 Obesity, unspecified: Secondary | ICD-10-CM | POA: Diagnosis not present

## 2023-11-04 DIAGNOSIS — E1169 Type 2 diabetes mellitus with other specified complication: Secondary | ICD-10-CM | POA: Diagnosis not present

## 2023-11-04 LAB — CBC
HCT: 39.5 % (ref 39.0–52.0)
Hemoglobin: 14 g/dL (ref 13.0–17.0)
MCH: 30.6 pg (ref 26.0–34.0)
MCHC: 35.4 g/dL (ref 30.0–36.0)
MCV: 86.2 fL (ref 80.0–100.0)
Platelets: 161 10*3/uL (ref 150–400)
RBC: 4.58 MIL/uL (ref 4.22–5.81)
RDW: 11.9 % (ref 11.5–15.5)
WBC: 5.6 10*3/uL (ref 4.0–10.5)
nRBC: 0 % (ref 0.0–0.2)

## 2023-11-04 LAB — GLUCOSE, CAPILLARY
Glucose-Capillary: 113 mg/dL — ABNORMAL HIGH (ref 70–99)
Glucose-Capillary: 114 mg/dL — ABNORMAL HIGH (ref 70–99)
Glucose-Capillary: 130 mg/dL — ABNORMAL HIGH (ref 70–99)
Glucose-Capillary: 110 mg/dL — ABNORMAL HIGH (ref 70–99)

## 2023-11-04 LAB — HEPARIN LEVEL (UNFRACTIONATED)
Heparin Unfractionated: 0.45 [IU]/mL (ref 0.30–0.70)
Heparin Unfractionated: 0.36 [IU]/mL (ref 0.30–0.70)

## 2023-11-04 MED ORDER — AMLODIPINE BESYLATE 5 MG PO TABS
5.0000 mg | ORAL_TABLET | Freq: Once | ORAL | Status: AC
  Administered 2023-11-04: 5 mg via ORAL
  Filled 2023-11-04: qty 1

## 2023-11-04 MED ORDER — HYDROCHLOROTHIAZIDE 25 MG PO TABS
25.0000 mg | ORAL_TABLET | Freq: Every day | ORAL | Status: DC
  Administered 2023-11-04 – 2023-11-07 (×4): 25 mg via ORAL
  Filled 2023-11-04 (×4): qty 1

## 2023-11-04 MED ORDER — AMLODIPINE BESYLATE 5 MG PO TABS
5.0000 mg | ORAL_TABLET | Freq: Every day | ORAL | Status: DC
  Administered 2023-11-04: 5 mg via ORAL
  Filled 2023-11-04: qty 1

## 2023-11-04 MED ORDER — AMLODIPINE BESYLATE 10 MG PO TABS
10.0000 mg | ORAL_TABLET | Freq: Every day | ORAL | Status: DC
  Administered 2023-11-05 – 2023-11-08 (×4): 10 mg via ORAL
  Filled 2023-11-04 (×4): qty 1

## 2023-11-04 NOTE — Progress Notes (Signed)
[facility] CLINIC CARDIOLOGY PROGRESS NOTE   Patient ID: Kevin Reid MRN: [id number] DOB/AGE: 08/04/1961 62 y.o.  Admit date: 11/02/2023 Referring Physician Dr Renae Gloss Primary Physician Kandyce Rud, MD Primary Cardiologist Dr Juliann Pares Reason for Consultation NSTEMI   HPI: Kevin Reid is a 62 y.o. male  with a past medical history of hypertension and hyperlipidemia who presented to the ED on 11/02/2023 for chest pain. Patient noted to have elevated troponins with persistent chest pain. He has not seen cardiology in a few years now. Patient is agreeable to proceed with left heart cath with possible intervervention on Monday morning. He has stable chest pain at rest.  Interval History:  -patient seen and examined at bedside, resting comfortably. No events overnight. He is feeling well this morning and ready for cath tomorrow.  Review of systems complete and found to be negative unless listed above    Vitals:   11/04/23 0500 11/04/23 0738 11/04/23 1154 11/04/23 1158  BP:  (!) 160/98  (!) 162/106  Pulse:   (!) 56   Resp:      Temp:  97.8 F (36.6 C) 97.8 F (36.6 C)   TempSrc:  Oral Oral   SpO2:   98%   Weight: 92.4 kg     Height:         Intake/Output Summary (Last 24 hours) at 11/04/2023 1206 Last data filed at 11/04/2023 1156 Gross per 24 hour  Intake 50 ml  Output 1200 ml  Net -1150 ml     PHYSICAL EXAM General: awake, well nourished, in no acute distress. HEENT: Normocephalic and atraumatic. Neck: No JVD.  Lungs: Normal respiratory effort. Clear bilaterally to auscultation. No wheezes, crackles, rhonchi.  Heart: HRRR. Normal S1 and S2 without gallops or murmurs. Radial & DP pulses 2+ bilaterally. Abdomen: Non-distended appearing.  Msk: Normal strength and tone for age. Extremities: No clubbing, cyanosis or edema.   Neuro: Alert and oriented X 3. Psych: Mood appropriate, affect congruent.    LABS: Basic Metabolic Panel: Recent Labs    11/02/23 1430  11/03/23 0501  NA 132* 136  K 3.2* 3.6  CL 98 102  CO2 24 25  GLUCOSE 117* 120*  BUN 26* 27*  CREATININE 0.98 1.02  CALCIUM 9.0 8.8*   Liver Function Tests: No results for input(s): "AST", "ALT", "ALKPHOS", "BILITOT", "PROT", "ALBUMIN" in the last 72 hours. No results for input(s): "LIPASE", "AMYLASE" in the last 72 hours. CBC: Recent Labs    11/02/23 1430 11/03/23 0501 11/04/23 0515  WBC 6.2 5.4 5.6  NEUTROABS 4.2  --   --   HGB 14.2 14.2 14.0  HCT 40.1 40.7 39.5  MCV 86.6 89.1 86.2  PLT 184 150 161   Cardiac Enzymes: Recent Labs    11/02/23 1430 11/02/23 1659 11/03/23 0050  TROPONINIHS 144* 168* 181*   BNP: No results for input(s): "BNP" in the last 72 hours. D-Dimer: No results for input(s): "DDIMER" in the last 72 hours. Hemoglobin A1C: Recent Labs    11/02/23 1430  HGBA1C 5.4   Fasting Lipid Panel: Recent Labs    11/03/23 0501  CHOL 140  HDL 31*  LDLCALC 74  TRIG 696*  CHOLHDL 4.5   Thyroid Function Tests: Recent Labs    11/02/23 1659  TSH 3.115   Anemia Panel: No results for input(s): "VITAMINB12", "FOLATE", "FERRITIN", "TIBC", "IRON", "RETICCTPCT" in the last 72 hours.  DG Chest Portable 1 View  Result Date: 11/02/2023 CLINICAL DATA:  Chest pain EXAM: PORTABLE CHEST 1 VIEW COMPARISON:  X-ray 07/04/2021 FINDINGS: No consolidation, pneumothorax or effusion. No edema. Normal cardiopericardial silhouette when adjusting for technique. Left shoulder arthroplasty. IMPRESSION: No acute cardiopulmonary disease. Electronically Signed   By: Karen Kays M.D.   On: 11/02/2023 17:23     ECHO pending  TELEMETRY reviewed by me 11/04/23: NSR  EKG reviewed by me 11/04/23: Sinus bradycardia with eft axis deviation, voltage criteria for LVH. Cannot rule out Anterior infarct , age undetermined. ST & T wave abnormality, consider lateral ischemia  DATA reviewed by me 11/04/23: last 24h vitals tele labs imaging I/O provider notes    ASSESSMENT AND  PLAN:  Principal Problem:   NSTEMI (non-ST elevated myocardial infarction) (HCC) Active Problems:   Type 2 diabetes mellitus with hyperlipidemia (HCC)   Hyperlipidemia, unspecified   Hypertension   Sleep apnea   Obesity (BMI 30-39.9)    NSTEMI HTN HLD Schedule for cardiac catheterization, and possible angioplasty. We discussed regarding risks, benefits, alternatives to this including stress testing, CTA and continued medical therapy. Patient wants to proceed. Understands <1-2% risk of death, stroke, MI, urgent CABG, bleeding, infection, renal failure but not limited to these. Continue heparin gtt Will add nitroglycerin gtt for persistent chest pain. Restart home cardiac meds. Cath tentatively planned for Monday 11/11    [name], DO 11/04/2023, 12:06 PM [facility]

## 2023-11-04 NOTE — Consult Note (Signed)
Pharmacy Consult Note - Anticoagulation  Pharmacy Consult for heparin Indication: chest pain/ACS  PATIENT MEASUREMENTS: Height: 5\' 10"  (177.8 cm) Weight: 98.9 kg (218 lb) IBW/kg (Calculated) : 73 HEPARIN DW (KG): 93.5  VITAL SIGNS: Temp: 98.2 F (36.8 C) (11/09 2100) Temp Source: Oral (11/09 2100) BP: 157/100 (11/09 2000) Pulse Rate: 65 (11/09 2000)  Recent Labs    11/02/23 1659 11/03/23 0028 11/03/23 0050 11/03/23 0501 11/03/23 1304 11/04/23 0515  HGB  --   --   --  14.2  --  14.0  HCT  --   --   --  40.7  --  39.5  PLT  --   --   --  150  --  161  APTT 31  --   --   --   --   --   LABPROT 13.7  --   --   --   --   --   INR 1.0  --   --   --   --   --   HEPARINUNFRC  --    < >  --  0.16*   < > 0.45  CREATININE  --   --   --  1.02  --   --   TROPONINIHS 168*  --  181*  --   --   --    < > = values in this interval not displayed.    Estimated Creatinine Clearance: 89.7 mL/min (by C-G formula based on SCr of 1.02 mg/dL).  PAST MEDICAL HISTORY: Past Medical History:  Diagnosis Date   Allergy    Depression    Diabetes mellitus without complication (HCC)    GERD (gastroesophageal reflux disease)    Heart murmur    Hyperlipidemia    Hypertension    Joint pain    Sleep apnea     ASSESSMENT: 62 y.o. male with PMH including GERD, HTN is presenting with exertional chest pain. Patient is not on chronic anticoagulation per chart review. ECG exhibiting possible ST-T changes and cTn elevated at 144. Pharmacy has been consulted to initiate and manage heparin intravenous infusion.  Pertinent medications: No chronic anticoagulation PTA per chart review  Goal(s) of therapy: Heparin level 0.3 - 0.7 units/mL Monitor platelets by anticoagulation protocol: Yes   Baseline anticoagulation labs: Recent Labs    11/02/23 1430 11/02/23 1659 11/03/23 0501 11/04/23 0515  APTT  --  31  --   --   INR  --  1.0  --   --   HGB 14.2  --  14.2 14.0  PLT 184  --  150 161     Date Time aPTT/HL Rate/Comment 11/9     0028      0.56               therapeutic X 1  11/9     0501      0.16               SUBtherapeutic  11/9 1304   0.47  Therapeutic x1 11/9 2044   0.26  SUBtherapeutic 11/10   0515      0.45               Therapeutic X 1      PLAN: 11/10:  HL @ 0515 = 0.45, therapeutic X 1 - Will continue pt on current rate and recheck HL in 6 hrs - CBC daily  Kearsten Ginther D Clinical Pharmacist 11/04/2023

## 2023-11-04 NOTE — Consult Note (Signed)
Pharmacy Consult Note - Anticoagulation  Pharmacy Consult for heparin Indication: chest pain/ACS  PATIENT MEASUREMENTS: Height: 5\' 10"  (177.8 cm) Weight: 92.4 kg (203 lb 12.8 oz) IBW/kg (Calculated) : 73 HEPARIN DW (KG): 93.5   Recent Labs    11/02/23 1659 11/03/23 0028 11/03/23 0050 11/03/23 0501 11/03/23 1304 11/04/23 0515 11/04/23 1047  HGB  --   --   --  14.2  --  14.0  --   HCT  --   --   --  40.7  --  39.5  --   PLT  --   --   --  150  --  161  --   APTT 31  --   --   --   --   --   --   LABPROT 13.7  --   --   --   --   --   --   INR 1.0  --   --   --   --   --   --   HEPARINUNFRC  --    < >  --  0.16*   < > 0.45 0.36  CREATININE  --   --   --  1.02  --   --   --   TROPONINIHS 168*  --  181*  --   --   --   --    < > = values in this interval not displayed.    Estimated Creatinine Clearance: 86.9 mL/min (by C-G formula based on SCr of 1.02 mg/dL).  PAST MEDICAL HISTORY: Past Medical History:  Diagnosis Date   Allergy    Depression    Diabetes mellitus without complication (HCC)    GERD (gastroesophageal reflux disease)    Heart murmur    Hyperlipidemia    Hypertension    Joint pain    Sleep apnea     ASSESSMENT: 62 y.o. male with PMH including GERD, HTN is presenting with exertional chest pain. Patient is not on chronic anticoagulation per chart review. ECG exhibiting possible ST-T changes and cTn elevated at 144. Pharmacy has been consulted to initiate and manage heparin intravenous infusion.  Pertinent medications: No chronic anticoagulation PTA per chart review  Goal(s) of therapy: Heparin level 0.3 - 0.7 units/mL Monitor platelets by anticoagulation protocol: Yes   Baseline anticoagulation labs: Recent Labs    11/02/23 1430 11/02/23 1659 11/03/23 0501 11/04/23 0515  APTT  --  31  --   --   INR  --  1.0  --   --   HGB 14.2  --  14.2 14.0  PLT 184  --  150 161    Date Time aPTT/HL Rate/Comment 11/9     0028      0.56                Therapeutic x 1  11/9     0501      0.16               SUBtherapeutic  11/9 1304   0.47  Therapeutic x 1 11/9 2044   0.26  SUBtherapeutic 11/10   0515      0.45               Therapeutic x 1 11/10 1047   0.36   Therapeutic x 2   PLAN: --Heparin level is therapeutic x 2 --Continue heparin infusion at 1800 units/hr --Re-check HL tomorrow AM --Daily CBC per protocol while on IV heparin  Trinna Post  Loura Pardon 11/04/2023

## 2023-11-04 NOTE — Progress Notes (Signed)
  Progress Note   Patient: Kevin Reid ZOX:[id number] DOB: 05-09-1961 DOA: 11/02/2023     2 DOS: the patient was seen and examined on 11/04/2023   Brief hospital course: 62 year old man past medical history of hypertension hyperlipidemia type 2 diabetes mellitus, sleep apnea on CPAP presents to the ED secondary to chest pain.  For months patient has been having some discomfort.  In August the PCP put him on Prilosec which seemed to help a little bit but patient continued having chest pains on and off recently having chest pain going up a flight of stairs.  He came to the ER and found to have an elevated troponin and started on heparin infusion.  11/9.  Patient still has some intermittent chest pain.  Troponin went up to 181.  Continue heparin drip and aspirin and Lipitor. 11/10.  Blood pressure elevated today started Norvasc.  Patient will have cardiac catheterization tomorrow.  Assessment and Plan: * NSTEMI (non-ST elevated myocardial infarction) (HCC) Last troponin up at 181.  Continue heparin drip.  Continue aspirin.  Heart rate on the lower side so beta-blocker currently on hold.  Continue Lipitor.  Cardiology to set up cardiac catheterization for tomorrow  Hypertension Bisoprolol on hold with bradycardia.  Patient on Avapro, hydrochlorothiazide and I added Norvasc today  Type 2 diabetes mellitus with hyperlipidemia (HCC) Last A1c of 6.3% in April 2024.  Continue sliding scale insulin.  Last 2 sugars 113 and 114.  LDL 74.  Continue high-dose Lipitor.  Obesity (BMI 30-39.9) BMI down to 29.24  Sleep apnea CPAP at bedtime        Subjective: Patient feeling okay.  Offers complaint of a little feeling of chest discomfort but not much.  Admitted with NSTEMI.  Physical Exam: Vitals:   11/04/23 0500 11/04/23 0738 11/04/23 1154 11/04/23 1158  BP:  (!) 160/98  (!) 162/106  Pulse:   (!) 56   Resp:      Temp:  97.8 F (36.6 C) 97.8 F (36.6 C)   TempSrc:  Oral Oral   SpO2:   98%    Weight: 92.4 kg     Height:       Physical Exam HENT:     Head: Normocephalic.     Mouth/Throat:     Pharynx: No oropharyngeal exudate.  Eyes:     General: Lids are normal.     Conjunctiva/sclera: Conjunctivae normal.  Cardiovascular:     Rate and Rhythm: Normal rate and regular rhythm.     Heart sounds: Normal heart sounds, S1 normal and S2 normal.  Pulmonary:     Breath sounds: No decreased breath sounds, wheezing, rhonchi or rales.  Abdominal:     Palpations: Abdomen is soft.     Tenderness: There is no abdominal tenderness.  Musculoskeletal:     Right lower leg: No swelling.     Left lower leg: No swelling.  Skin:    General: Skin is warm.     Findings: No rash.  Neurological:     Mental Status: He is alert and oriented to person, place, and time.     Data Reviewed: CBC normal Family Communication: Wife at bedside  Disposition: Status is: Inpatient Remains inpatient appropriate because: Cardiac catheterization for tomorrow  Planned Discharge Destination: Home    Time spent: 27 minutes  Author: Alford Highland, MD 11/04/2023 1:33 PM  For on call review [email or web address].

## 2023-11-05 ENCOUNTER — Inpatient Hospital Stay
Admit: 2023-11-05 | Discharge: 2023-11-05 | Disposition: A | Discharge status: Home / Self Care | Payer: 59 | Attending: Internal Medicine | Admitting: Internal Medicine

## 2023-11-05 DIAGNOSIS — E1169 Type 2 diabetes mellitus with other specified complication: Secondary | ICD-10-CM | POA: Diagnosis not present

## 2023-11-05 DIAGNOSIS — E669 Obesity, unspecified: Secondary | ICD-10-CM | POA: Diagnosis not present

## 2023-11-05 DIAGNOSIS — I214 Non-ST elevation (NSTEMI) myocardial infarction: Secondary | ICD-10-CM | POA: Diagnosis not present

## 2023-11-05 DIAGNOSIS — I10 Essential (primary) hypertension: Secondary | ICD-10-CM | POA: Diagnosis not present

## 2023-11-05 DIAGNOSIS — E871 Hypo-osmolality and hyponatremia: Secondary | ICD-10-CM | POA: Insufficient documentation

## 2023-11-05 LAB — CBC
HCT: 42.2 % (ref 39.0–52.0)
Hemoglobin: 15.1 g/dL (ref 13.0–17.0)
MCH: 31.1 pg (ref 26.0–34.0)
MCHC: 35.8 g/dL (ref 30.0–36.0)
MCV: 87 fL (ref 80.0–100.0)
Platelets: 170 10*3/uL (ref 150–400)
RBC: 4.85 MIL/uL (ref 4.22–5.81)
RDW: 11.9 % (ref 11.5–15.5)
WBC: 6.6 10*3/uL (ref 4.0–10.5)
nRBC: 0 % (ref 0.0–0.2)

## 2023-11-05 LAB — GLUCOSE, CAPILLARY
Glucose-Capillary: 135 mg/dL — ABNORMAL HIGH (ref 70–99)
Glucose-Capillary: 163 mg/dL — ABNORMAL HIGH (ref 70–99)
Glucose-Capillary: 122 mg/dL — ABNORMAL HIGH (ref 70–99)
Glucose-Capillary: 132 mg/dL — ABNORMAL HIGH (ref 70–99)

## 2023-11-05 LAB — BASIC METABOLIC PANEL
Anion gap: 9 (ref 5–15)
BUN: 24 mg/dL — ABNORMAL HIGH (ref 8–23)
CO2: 22 mmol/L (ref 22–32)
Calcium: 9.7 mg/dL (ref 8.9–10.3)
Chloride: 103 mmol/L (ref 98–111)
Creatinine, Ser: 1.01 mg/dL (ref 0.61–1.24)
GFR, Estimated: >60 mL/min (ref >60)
Glucose, Bld: 124 mg/dL — ABNORMAL HIGH (ref 70–99)
Potassium: 3.5 mmol/L (ref 3.5–5.1)
Sodium: 134 mmol/L — ABNORMAL LOW (ref 135–145)

## 2023-11-05 LAB — HEPARIN LEVEL (UNFRACTIONATED): Heparin Unfractionated: 0.46 [IU]/mL (ref 0.30–0.70)

## 2023-11-05 MED ORDER — SODIUM CHLORIDE 0.9 % WEIGHT BASED INFUSION
1.0000 mL/kg/h | INTRAVENOUS | Status: AC
  Administered 2023-11-05: 1 mL/kg/h via INTRAVENOUS

## 2023-11-05 MED ORDER — BISOPROLOL FUMARATE 5 MG PO TABS
2.5000 mg | ORAL_TABLET | Freq: Every day | ORAL | Status: DC
  Administered 2023-11-05 – 2023-11-08 (×4): 2.5 mg via ORAL
  Filled 2023-11-05 (×4): qty 0.5

## 2023-11-05 MED ORDER — ASPIRIN 81 MG PO CHEW: 81.0000 mg | CHEWABLE_TABLET | ORAL | Status: DC

## 2023-11-05 NOTE — Plan of Care (Signed)
  Problem: Education: Goal: Ability to describe self-care measures that may prevent or decrease complications (Diabetes Survival Skills Education) will improve Outcome: Progressing Goal: Individualized Educational Video(s) Outcome: Progressing   Problem: Coping: Goal: Ability to adjust to condition or change in health will improve Outcome: Progressing   Problem: Fluid Volume: Goal: Ability to maintain a balanced intake and output will improve Outcome: Progressing   Problem: Health Behavior/Discharge Planning: Goal: Ability to identify and utilize available resources and services will improve Outcome: Progressing Goal: Ability to manage health-related needs will improve Outcome: Progressing   Problem: Metabolic: Goal: Ability to maintain appropriate glucose levels will improve Outcome: Progressing   Problem: Nutritional: Goal: Maintenance of adequate nutrition will improve Outcome: Progressing Goal: Progress toward achieving an optimal weight will improve Outcome: Progressing   Problem: Skin Integrity: Goal: Risk for impaired skin integrity will decrease Outcome: Progressing   Problem: Tissue Perfusion: Goal: Adequacy of tissue perfusion will improve Outcome: Progressing   Problem: Education: Goal: Understanding of cardiac disease, CV risk reduction, and recovery process will improve Outcome: Progressing Goal: Individualized Educational Video(s) Outcome: Progressing   Problem: Activity: Goal: Ability to tolerate increased activity will improve Outcome: Progressing   Problem: Cardiac: Goal: Ability to achieve and maintain adequate cardiovascular perfusion will improve Outcome: Progressing   Problem: Health Behavior/Discharge Planning: Goal: Ability to safely manage health-related needs after discharge will improve Outcome: Progressing   Problem: Education: Goal: Knowledge of General Education information will improve Description: Including pain rating scale,  medication(s)/side effects and non-pharmacologic comfort measures Outcome: Progressing   Problem: Health Behavior/Discharge Planning: Goal: Ability to manage health-related needs will improve Outcome: Progressing   Problem: Clinical Measurements: Goal: Ability to maintain clinical measurements within normal limits will improve Outcome: Progressing Goal: Will remain free from infection Outcome: Progressing Goal: Diagnostic test results will improve Outcome: Progressing Goal: Respiratory complications will improve Outcome: Progressing Goal: Cardiovascular complication will be avoided Outcome: Progressing   Problem: Activity: Goal: Risk for activity intolerance will decrease Outcome: Progressing   Problem: Nutrition: Goal: Adequate nutrition will be maintained Outcome: Progressing   Problem: Coping: Goal: Level of anxiety will decrease Outcome: Progressing   Problem: Elimination: Goal: Will not experience complications related to bowel motility Outcome: Progressing Goal: Will not experience complications related to urinary retention Outcome: Progressing   Problem: Pain Management: Goal: General experience of comfort will improve Outcome: Progressing   Problem: Safety: Goal: Ability to remain free from injury will improve Outcome: Progressing   Problem: Skin Integrity: Goal: Risk for impaired skin integrity will decrease Outcome: Progressing   Problem: Education: Goal: Understanding of CV disease, CV risk reduction, and recovery process will improve Outcome: Progressing Goal: Individualized Educational Video(s) Outcome: Progressing   Problem: Activity: Goal: Ability to return to baseline activity level will improve Outcome: Progressing   Problem: Cardiovascular: Goal: Ability to achieve and maintain adequate cardiovascular perfusion will improve Outcome: Progressing Goal: Vascular access site(s) Level 0-1 will be maintained Outcome: Progressing   Problem:  Health Behavior/Discharge Planning: Goal: Ability to safely manage health-related needs after discharge will improve Outcome: Progressing

## 2023-11-05 NOTE — Progress Notes (Signed)
Transition of Care [facility]) - Inpatient Brief Assessment   Patient Details  Name: Kevin Reid MRN: [id number] Date of Birth: 10-22-1961  Transition of Care [facility]) CM/SW Contact:    Darolyn Rua, LCSW Phone Number: 11/05/2023, 9:27 AM   Clinical Narrative:  Patient from home presents to ED with chest pain, cards following plan for cardiac catheterization today.   PCP: Dr. Kandyce Rud Insurance: Monia Pouch  Please consult [facility] should discharge planning needs arise.   Transition of Care Asessment: Insurance and Status: Insurance coverage has been reviewed Patient has primary care physician: Yes Home environment has been reviewed: from home Prior level of function:: independent Prior/Current Home Services: No current home services Social Determinants of Health Reivew: SDOH reviewed no interventions necessary Readmission risk has been reviewed: Yes Transition of care needs: no transition of care needs at this time

## 2023-11-05 NOTE — Progress Notes (Signed)
*  PRELIMINARY RESULTS* Echocardiogram 2D Echocardiogram has been performed.  Carolyne Fiscal 11/05/2023, 3:08 PM

## 2023-11-05 NOTE — Progress Notes (Signed)
[facility] CLINIC CARDIOLOGY PROGRESS NOTE   Patient ID: Kevin Reid MRN: [id number] DOB/AGE: June 20, 1961 62 y.o.  Admit date: 11/02/2023 Referring Physician Dr Renae Gloss Primary Physician Kandyce Rud, MD Primary Cardiologist Dr Juliann Pares Reason for Consultation NSTEMI   HPI: Kevin Reid is a 62 y.o. male with a past medical history of hypertension and hyperlipidemia who presented to the ED on 11/02/2023 for chest pain. Patient noted to have elevated troponins with persistent chest pain. He has not seen cardiology in a few years now. Cardiology was consulted for further evaluation.   Interval History:  -Patient seen and examined this AM with wife at bedside. Expressed frustration over LHC being scheduled for tomorrow.  -Reports intermittent, short lasting episode of CP. Denies SOB or palpitations. States he has been OOB walking some.  -BP and HR relatively controlled. Appears to be tolerating medications well.   Review of systems complete and found to be negative unless listed above    Vitals:   11/05/23 0418 11/05/23 0720 11/05/23 1113 11/05/23 1115  BP: (!) 156/93 139/84 (!) 172/93 (!) 161/89  Pulse: (!) 57 (!) 54 62 61  Resp:  18 20   Temp: 98.1 F (36.7 C) 97.6 F (36.4 C) 98.1 F (36.7 C)   TempSrc: Oral     SpO2: 94% 99% 100%   Weight:      Height:         Intake/Output Summary (Last 24 hours) at 11/05/2023 1348 Last data filed at 11/05/2023 0700 Gross per 24 hour  Intake 1686.03 ml  Output 1100 ml  Net 586.03 ml     PHYSICAL EXAM General: awake, well nourished, in no acute distress sitting upright in hospital bed with wife present at bedside. HEENT: Normocephalic and atraumatic. Neck: No JVD.  Lungs: Normal respiratory effort on room air. Clear bilaterally to auscultation. No wheezes, crackles, rhonchi.  Heart: HRRR. Normal S1 and S2 without gallops or murmurs. Radial & DP pulses 2+ bilaterally. Abdomen: Non-distended appearing.  Msk: Normal strength  and tone for age. Extremities: No clubbing, cyanosis or edema.   Neuro: Alert and oriented X 3. Psych: Mood appropriate, affect congruent.    LABS: Basic Metabolic Panel: Recent Labs    11/03/23 0501 11/05/23 0555  NA 136 134*  K 3.6 3.5  CL 102 103  CO2 25 22  GLUCOSE 120* 124*  BUN 27* 24*  CREATININE 1.02 1.01  CALCIUM 8.8* 9.7   Liver Function Tests: No results for input(s): "AST", "ALT", "ALKPHOS", "BILITOT", "PROT", "ALBUMIN" in the last 72 hours. No results for input(s): "LIPASE", "AMYLASE" in the last 72 hours. CBC: Recent Labs    11/02/23 1430 11/03/23 0501 11/04/23 0515 11/05/23 0555  WBC 6.2   < > 5.6 6.6  NEUTROABS 4.2  --   --   --   HGB 14.2   < > 14.0 15.1  HCT 40.1   < > 39.5 42.2  MCV 86.6   < > 86.2 87.0  PLT 184   < > 161 170   < > = values in this interval not displayed.   Cardiac Enzymes: Recent Labs    11/02/23 1430 11/02/23 1659 11/03/23 0050  TROPONINIHS 144* 168* 181*   BNP: No results for input(s): "BNP" in the last 72 hours. D-Dimer: No results for input(s): "DDIMER" in the last 72 hours. Hemoglobin A1C: Recent Labs    11/02/23 1430  HGBA1C 5.4   Fasting Lipid Panel: Recent Labs    11/03/23 0501  CHOL 140  HDL  31*  LDLCALC 74  TRIG 176*  CHOLHDL 4.5   Thyroid Function Tests: Recent Labs    11/02/23 1659  TSH 3.115   Anemia Panel: No results for input(s): "VITAMINB12", "FOLATE", "FERRITIN", "TIBC", "IRON", "RETICCTPCT" in the last 72 hours.  No results found.   ECHO pending review  TELEMETRY reviewed by me 11/05/23: sinus bradycardia rate 50s  EKG reviewed by me 11/05/23: Sinus bradycardia with left axis deviation, voltage criteria for LVH. Cannot rule out Anterior infarct , age undetermined. ST & T wave abnormality, consider lateral ischemia  DATA reviewed by me 11/05/23: last 24h vitals tele labs imaging I/O provider notes   Principal Problem:   NSTEMI (non-ST elevated myocardial infarction)  [facility]) Active Problems:   Type 2 diabetes mellitus with hyperlipidemia (HCC)   Hyperlipidemia, unspecified   Hypertension   Sleep apnea   Obesity (BMI 30-39.9)   Hyponatremia  ASSESSMENT AND PLAN: Kevin Reid is a 62 y.o. male with a past medical history of hypertension and hyperlipidemia who presented to the ED on 11/02/2023 for chest pain. Patient noted to have elevated troponins with persistent chest pain. He has not seen cardiology in a few years now. Cardiology was consulted for further evaluation.   # NSTEMI # Hypertension # Hyperlipidemia Patient presented with chest pain with radiation to arms/shoulders. Troponins mildly elevated and flat, trended 144 > 168 > 181. Placed on heparin in the ED.  -Continue heparin gtt. Plan for LHC tomorrow at 12:30 PM. -PRN nitroglycerin gtt for persistent chest pain. -Continue atorvastatin 80 mg daily, fenofibrate, and aspirin 81 mg daily.  -Continue amlodipine 10 mg daily, bisoprolol 2.5 mg daily, hydrochlorothiazide 25 mg daily, irbesartan 300 mg daily.  This patient's plan of care was discussed and created with Dr. Melton Alar and she is in agreement.    Signed: Gale Journey, PA-C 11/05/2023, 1:48 PM [facility] Cardiology

## 2023-11-05 NOTE — Assessment & Plan Note (Signed)
Sodium 2 point less than normal range.  Watch while on hydrochlorothiazide.

## 2023-11-05 NOTE — Progress Notes (Signed)
Progress Note   Patient: Kevin Reid LOV:[id number] DOB: 01/29/1961 DOA: 11/02/2023     3 DOS: the patient was seen and examined on 11/05/2023   Brief hospital course: 62 year old man past medical history of hypertension hyperlipidemia type 2 diabetes mellitus, sleep apnea on CPAP presents to the ED secondary to chest pain.  For months patient has been having some discomfort.  In August the PCP put him on Prilosec which seemed to help a little bit but patient continued having chest pains on and off recently having chest pain going up a flight of stairs.  He came to the ER and found to have an elevated troponin and started on heparin infusion.  11/9.  Patient still has some intermittent chest pain.  Troponin went up to 181.  Continue heparin drip and aspirin and Lipitor. 11/10.  Blood pressure elevated today started Norvasc.  Patient will have cardiac catheterization tomorrow. 11/11.  Cardiac catheterization got pushed off till Tuesday secondary to busy cardiac cath schedule.  Assessment and Plan: * NSTEMI (non-ST elevated myocardial infarction) (HCC) Last troponin up at 181.  Continue heparin drip.  Continue aspirin and Lipitor.  Try to start low-dose bisoprolol since heart rate in the 60s.  Unfortunately cardiac catheterization got pushed till Tuesday.  Hypertension Patient on Avapro, hydrochlorothiazide and Norvasc.  Since heart rate in the 60s I will try to add low-dose bisoprolol.  Type 2 diabetes mellitus with hyperlipidemia (HCC) Last A1c of 6.3% in April 2024.  Continue sliding scale insulin.  Last 2 sugars 110 and 135.  LDL 74.  Continue high-dose Lipitor.  Obesity (BMI 30-39.9) BMI 30.06.  Sleep apnea CPAP at bedtime  Hyponatremia Sodium 1 point less than normal range.  Watch while on hydrochlorothiazide.        Subjective: Patient does feel little tightness in his chest.  No shortness of breath.  No nausea or vomiting.  Admitted with NSTEMI.  Physical  Exam: Vitals:   11/05/23 0418 11/05/23 0720 11/05/23 1113 11/05/23 1115  BP: (!) 156/93 139/84 (!) 172/93 (!) 161/89  Pulse: (!) 57 (!) 54 62 61  Resp:  18 20   Temp: 98.1 F (36.7 C) 97.6 F (36.4 C) 98.1 F (36.7 C)   TempSrc: Oral     SpO2: 94% 99% 100%   Weight:      Height:       Physical Exam HENT:     Head: Normocephalic.     Mouth/Throat:     Pharynx: No oropharyngeal exudate.  Eyes:     General: Lids are normal.     Conjunctiva/sclera: Conjunctivae normal.  Cardiovascular:     Rate and Rhythm: Normal rate and regular rhythm.     Heart sounds: Normal heart sounds, S1 normal and S2 normal.  Pulmonary:     Breath sounds: No decreased breath sounds, wheezing, rhonchi or rales.  Abdominal:     Palpations: Abdomen is soft.     Tenderness: There is no abdominal tenderness.  Musculoskeletal:     Right lower leg: No swelling.     Left lower leg: No swelling.  Skin:    General: Skin is warm.     Findings: No rash.  Neurological:     Mental Status: He is alert and oriented to person, place, and time.     Data Reviewed: Sodium 134, creatinine 1.01, hemoglobin 15.1  Family Communication: Spoke with wife at the bedside  Disposition: Status is: Inpatient Remains inpatient appropriate because: Unfortunately cardiac cath cannot be done  today and got pushed till tomorrow.  Planned Discharge Destination: Home    Time spent: 27 minutes  Author: Alford Highland, MD 11/05/2023 11:21 AM  For on call review [email or web address].

## 2023-11-05 NOTE — Consult Note (Signed)
Pharmacy Consult Note - Anticoagulation  Pharmacy Consult for heparin Indication: chest pain/ACS  PATIENT MEASUREMENTS: Height: 5\' 10"  (177.8 cm) Weight: 95 kg (209 lb 8 oz) IBW/kg (Calculated) : 73 HEPARIN DW (KG): 93.5   Recent Labs    11/02/23 1659 11/03/23 0028 11/03/23 0050 11/03/23 0501 11/03/23 1304 11/05/23 0555  HGB  --   --   --  14.2   < > 15.1  HCT  --   --   --  40.7   < > 42.2  PLT  --   --   --  150   < > 170  APTT 31  --   --   --   --   --   LABPROT 13.7  --   --   --   --   --   INR 1.0  --   --   --   --   --   HEPARINUNFRC  --    < >  --  0.16*   < > 0.46  CREATININE  --   --   --  1.02  --   --   TROPONINIHS 168*  --  181*  --   --   --    < > = values in this interval not displayed.    Estimated Creatinine Clearance: 88 mL/min (by C-G formula based on SCr of 1.02 mg/dL).  PAST MEDICAL HISTORY: Past Medical History:  Diagnosis Date   Allergy    Depression    Diabetes mellitus without complication (HCC)    GERD (gastroesophageal reflux disease)    Heart murmur    Hyperlipidemia    Hypertension    Joint pain    Sleep apnea     ASSESSMENT: 62 y.o. male with PMH including GERD, HTN is presenting with exertional chest pain. Patient is not on chronic anticoagulation per chart review. ECG exhibiting possible ST-T changes and cTn elevated at 144. Pharmacy has been consulted to initiate and manage heparin intravenous infusion.  Pertinent medications: No chronic anticoagulation PTA per chart review  Goal(s) of therapy: Heparin level 0.3 - 0.7 units/mL Monitor platelets by anticoagulation protocol: Yes   Baseline anticoagulation labs: Recent Labs    11/02/23 1659 11/03/23 0501 11/04/23 0515 11/05/23 0555  APTT 31  --   --   --   INR 1.0  --   --   --   HGB  --  14.2 14.0 15.1  PLT  --  150 161 170    Date Time aPTT/HL Rate/Comment 11/9     0028      0.56               Therapeutic x 1  11/9     0501      0.16                SUBtherapeutic  11/9 1304   0.47  Therapeutic x 1 11/9 2044   0.26  SUBtherapeutic 11/10   0515      0.45               Therapeutic x 1 11/10 1047   0.36   Therapeutic x 2  11/11    0555     0.46                Therapeutic X 3   PLAN: --Heparin level is therapeutic x 3 --Continue heparin infusion at 1800 units/hr --Re-check HL tomorrow AM --Daily CBC per protocol while  on IV heparin  Lynetta Tomczak D 11/05/2023

## 2023-11-06 ENCOUNTER — Encounter
Admission: EM | Disposition: A | Discharge status: Home / Self Care | Payer: Self-pay | Source: Home / Self Care | Attending: Internal Medicine

## 2023-11-06 DIAGNOSIS — E876 Hypokalemia: Secondary | ICD-10-CM | POA: Insufficient documentation

## 2023-11-06 DIAGNOSIS — I10 Essential (primary) hypertension: Secondary | ICD-10-CM | POA: Diagnosis not present

## 2023-11-06 DIAGNOSIS — I214 Non-ST elevation (NSTEMI) myocardial infarction: Secondary | ICD-10-CM | POA: Diagnosis not present

## 2023-11-06 DIAGNOSIS — E669 Obesity, unspecified: Secondary | ICD-10-CM | POA: Diagnosis not present

## 2023-11-06 DIAGNOSIS — E1169 Type 2 diabetes mellitus with other specified complication: Secondary | ICD-10-CM | POA: Diagnosis not present

## 2023-11-06 HISTORY — PX: CORONARY STENT INTERVENTION: CATH118234

## 2023-11-06 HISTORY — PX: LEFT HEART CATH AND CORONARY ANGIOGRAPHY: CATH118249

## 2023-11-06 LAB — POCT ACTIVATED CLOTTING TIME: Activated Clotting Time: 417 s

## 2023-11-06 LAB — ECHOCARDIOGRAM COMPLETE
AR max vel: 3.97 cm2
AV Area VTI: 3.71 cm2
AV Area mean vel: 3.7 cm2
AV Mean grad: 4 mm[Hg]
AV Peak grad: 7.2 mm[Hg]
Ao pk vel: 1.34 m/s
Area-P 1/2: 2.62 cm2
Calc EF: 49 %
Height: 70 in
MV VTI: 4.53 cm2
S' Lateral: 3.6 cm
Single Plane A2C EF: 45.2 %
Single Plane A4C EF: 51.3 %
Weight: 3352 [oz_av]

## 2023-11-06 LAB — BASIC METABOLIC PANEL
Anion gap: 7 (ref 5–15)
BUN: 25 mg/dL — ABNORMAL HIGH (ref 8–23)
CO2: 23 mmol/L (ref 22–32)
Calcium: 10 mg/dL (ref 8.9–10.3)
Chloride: 103 mmol/L (ref 98–111)
Creatinine, Ser: 1.1 mg/dL (ref 0.61–1.24)
GFR, Estimated: >60 mL/min (ref >60)
Glucose, Bld: 128 mg/dL — ABNORMAL HIGH (ref 70–99)
Potassium: 3.5 mmol/L (ref 3.5–5.1)
Sodium: 133 mmol/L — ABNORMAL LOW (ref 135–145)

## 2023-11-06 LAB — CBC
HCT: 43.6 % (ref 39.0–52.0)
Hemoglobin: 15.5 g/dL (ref 13.0–17.0)
MCH: 30.5 pg (ref 26.0–34.0)
MCHC: 35.6 g/dL (ref 30.0–36.0)
MCV: 85.7 fL (ref 80.0–100.0)
Platelets: 183 10*3/uL (ref 150–400)
RBC: 5.09 MIL/uL (ref 4.22–5.81)
RDW: 11.8 % (ref 11.5–15.5)
WBC: 7.8 10*3/uL (ref 4.0–10.5)
nRBC: 0 % (ref 0.0–0.2)

## 2023-11-06 LAB — GLUCOSE, CAPILLARY
Glucose-Capillary: 125 mg/dL — ABNORMAL HIGH (ref 70–99)
Glucose-Capillary: 95 mg/dL (ref 70–99)
Glucose-Capillary: 108 mg/dL — ABNORMAL HIGH (ref 70–99)
Glucose-Capillary: 121 mg/dL — ABNORMAL HIGH (ref 70–99)
Glucose-Capillary: 155 mg/dL — ABNORMAL HIGH (ref 70–99)

## 2023-11-06 LAB — LIPOPROTEIN A (LPA): Lipoprotein (a): 87.4 nmol/L — ABNORMAL HIGH (ref <75.0)

## 2023-11-06 LAB — HEPARIN LEVEL (UNFRACTIONATED): Heparin Unfractionated: 0.36 [IU]/mL (ref 0.30–0.70)

## 2023-11-06 SURGERY — LEFT HEART CATH AND CORONARY ANGIOGRAPHY
Anesthesia: Moderate Sedation

## 2023-11-06 MED ORDER — HEPARIN (PORCINE) IN NACL 1000-0.9 UT/500ML-% IV SOLN
INTRAVENOUS | Status: AC
Start: 2023-11-06 — End: ?
  Filled 2023-11-06: qty 1000

## 2023-11-06 MED ORDER — HEPARIN SODIUM (PORCINE) 1000 UNIT/ML IJ SOLN
INTRAMUSCULAR | Status: AC
  Filled 2023-11-06: qty 10

## 2023-11-06 MED ORDER — SODIUM CHLORIDE 0.9 % IV SOLN
INTRAVENOUS | Status: AC | PRN
  Administered 2023-11-06: 100 mL/h via INTRAVENOUS

## 2023-11-06 MED ORDER — NITROGLYCERIN 1 MG/10 ML FOR IR/CATH LAB
INTRA_ARTERIAL | Status: AC
  Filled 2023-11-06: qty 10

## 2023-11-06 MED ORDER — ATROPINE SULFATE 1 MG/10ML IJ SOSY
PREFILLED_SYRINGE | INTRAMUSCULAR | Status: AC
  Filled 2023-11-06: qty 10

## 2023-11-06 MED ORDER — MIDAZOLAM HCL 2 MG/2ML IJ SOLN
INTRAMUSCULAR | Status: DC | PRN
  Administered 2023-11-06 (×2): 1 mg via INTRAVENOUS

## 2023-11-06 MED ORDER — IOHEXOL 300 MG/ML  SOLN
INTRAMUSCULAR | Status: DC | PRN
  Administered 2023-11-06: 240 mL

## 2023-11-06 MED ORDER — TICAGRELOR 90 MG PO TABS
ORAL_TABLET | ORAL | Status: DC | PRN
  Administered 2023-11-06: 180 mg via ORAL

## 2023-11-06 MED ORDER — POTASSIUM CHLORIDE CRYS ER 20 MEQ PO TBCR
40.0000 meq | EXTENDED_RELEASE_TABLET | Freq: Once | ORAL | Status: AC
  Administered 2023-11-06: 40 meq via ORAL
  Filled 2023-11-06: qty 2

## 2023-11-06 MED ORDER — NITROGLYCERIN 0.4 MG SL SUBL
SUBLINGUAL_TABLET | SUBLINGUAL | Status: AC
Start: 2023-11-06 — End: ?
  Filled 2023-11-06: qty 1

## 2023-11-06 MED ORDER — SODIUM CHLORIDE 0.9 % WEIGHT BASED INFUSION
1.0000 mL/kg/h | INTRAVENOUS | Status: DC
  Administered 2023-11-06 – 2023-11-07 (×3): 1 mL/kg/h via INTRAVENOUS

## 2023-11-06 MED ORDER — HEPARIN SODIUM (PORCINE) 1000 UNIT/ML IJ SOLN
INTRAMUSCULAR | Status: DC | PRN
  Administered 2023-11-06: 8000 [IU] via INTRAVENOUS
  Administered 2023-11-06: 5000 [IU] via INTRAVENOUS

## 2023-11-06 MED ORDER — SODIUM CHLORIDE 0.9 % IV SOLN
250.0000 mL | INTRAVENOUS | Status: DC | PRN
Start: 2023-11-06 — End: 2023-11-06

## 2023-11-06 MED ORDER — NITROGLYCERIN 2 % TD OINT
1.0000 [in_us] | TOPICAL_OINTMENT | Freq: Four times a day (QID) | TRANSDERMAL | Status: DC
  Administered 2023-11-06 – 2023-11-08 (×5): 1 [in_us] via TOPICAL
  Filled 2023-11-06 (×4): qty 1
  Filled 2023-11-06: qty 2

## 2023-11-06 MED ORDER — NITROGLYCERIN 0.4 MG SL SUBL
SUBLINGUAL_TABLET | SUBLINGUAL | Status: AC
  Administered 2023-11-06: 0.4 mg via SUBLINGUAL
  Filled 2023-11-06: qty 1

## 2023-11-06 MED ORDER — SODIUM CHLORIDE 0.9 % WEIGHT BASED INFUSION: 1.0000 mL/kg/h | INTRAVENOUS | Status: AC

## 2023-11-06 MED ORDER — VERAPAMIL HCL 2.5 MG/ML IV SOLN
INTRAVENOUS | Status: DC | PRN
  Administered 2023-11-06: 2.5 mg via INTRA_ARTERIAL

## 2023-11-06 MED ORDER — SODIUM CHLORIDE 0.9% FLUSH: 3.0000 mL | INTRAVENOUS | Status: DC | PRN

## 2023-11-06 MED ORDER — TICAGRELOR 90 MG PO TABS: 90.0000 mg | ORAL_TABLET | Freq: Two times a day (BID) | ORAL | Status: DC

## 2023-11-06 MED ORDER — FENTANYL CITRATE (PF) 100 MCG/2ML IJ SOLN
INTRAMUSCULAR | Status: AC
  Filled 2023-11-06: qty 2

## 2023-11-06 MED ORDER — VERAPAMIL HCL 2.5 MG/ML IV SOLN
INTRAVENOUS | Status: AC
  Filled 2023-11-06: qty 2

## 2023-11-06 MED ORDER — FENTANYL CITRATE (PF) 100 MCG/2ML IJ SOLN
INTRAMUSCULAR | Status: DC | PRN
  Administered 2023-11-06: 25 ug via INTRAVENOUS
  Administered 2023-11-06: 50 ug via INTRAVENOUS
  Administered 2023-11-06: 25 ug via INTRAVENOUS

## 2023-11-06 MED ORDER — ASPIRIN 81 MG PO CHEW
CHEWABLE_TABLET | ORAL | Status: AC
  Filled 2023-11-06: qty 4

## 2023-11-06 MED ORDER — SODIUM CHLORIDE 0.9 % WEIGHT BASED INFUSION
3.0000 mL/kg/h | INTRAVENOUS | Status: AC
  Administered 2023-11-06: 3 mL/kg/h via INTRAVENOUS

## 2023-11-06 MED ORDER — ASPIRIN 81 MG PO CHEW
81.0000 mg | CHEWABLE_TABLET | Freq: Every day | ORAL | Status: DC
  Administered 2023-11-07: 81 mg via ORAL
  Filled 2023-11-06: qty 1

## 2023-11-06 MED ORDER — SODIUM CHLORIDE 0.9% FLUSH: 3.0000 mL | Freq: Two times a day (BID) | INTRAVENOUS | Status: DC

## 2023-11-06 MED ORDER — ASPIRIN 81 MG PO CHEW
CHEWABLE_TABLET | ORAL | Status: DC | PRN
  Administered 2023-11-06: 324 mg via ORAL

## 2023-11-06 MED ORDER — TICAGRELOR 90 MG PO TABS
90.0000 mg | ORAL_TABLET | Freq: Two times a day (BID) | ORAL | Status: DC
  Administered 2023-11-06 – 2023-11-07 (×2): 90 mg via ORAL
  Filled 2023-11-06 (×2): qty 1

## 2023-11-06 MED ORDER — TICAGRELOR 90 MG PO TABS
ORAL_TABLET | ORAL | Status: AC
  Filled 2023-11-06: qty 2

## 2023-11-06 MED ORDER — HEPARIN (PORCINE) IN NACL 2000-0.9 UNIT/L-% IV SOLN
INTRAVENOUS | Status: DC | PRN
  Administered 2023-11-06: 1000 mL

## 2023-11-06 MED ORDER — MIDAZOLAM HCL 2 MG/2ML IJ SOLN
INTRAMUSCULAR | Status: AC
Start: 2023-11-06 — End: ?
  Filled 2023-11-06: qty 2

## 2023-11-06 MED ORDER — NITROGLYCERIN 1 MG/10 ML FOR IR/CATH LAB
INTRA_ARTERIAL | Status: DC | PRN
  Administered 2023-11-06: 200 ug via INTRA_ARTERIAL

## 2023-11-06 MED ORDER — HYDROMORPHONE HCL 1 MG/ML IJ SOLN: 1.0000 mg | INTRAMUSCULAR | Status: AC | PRN

## 2023-11-06 MED ORDER — NITROGLYCERIN 0.4 MG SL SUBL
SUBLINGUAL_TABLET | SUBLINGUAL | Status: AC
  Filled 2023-11-06: qty 2

## 2023-11-06 SURGICAL SUPPLY — 19 items
BALLN MINITREK RX 2.0X20 (BALLOONS) ×1
BALLN ~~LOC~~ EUPHORA RX 3.5X20 (BALLOONS) ×1
BALLOON MINITREK RX 2.0X20 (BALLOONS) IMPLANT
BALLOON ~~LOC~~ EUPHORA RX 3.5X20 (BALLOONS) IMPLANT
CATH 5FR JL3.5 JR4 ANG PIG MP (CATHETERS) IMPLANT
CATH LAUNCHER 6FR JR4 (CATHETERS) IMPLANT
DEVICE RAD TR BAND REGULAR (VASCULAR PRODUCTS) IMPLANT
DRAPE BRACHIAL (DRAPES) IMPLANT
GLIDESHEATH SLEND SS 6F .021 (SHEATH) IMPLANT
GUIDEWIRE INQWIRE 1.5J.035X260 (WIRE) IMPLANT
INQWIRE 1.5J .035X260CM (WIRE) ×1
PACK CARDIAC CATH (CUSTOM PROCEDURE TRAY) ×1 IMPLANT
PROTECTION STATION PRESSURIZED (MISCELLANEOUS) ×1
SET ATX-X65L (MISCELLANEOUS) IMPLANT
STATION PROTECTION PRESSURIZED (MISCELLANEOUS) IMPLANT
STENT ONYX FRONTIER 2.0X22 (Permanent Stent) IMPLANT
STENT ONYX FRONTIER 3.0X30 (Permanent Stent) IMPLANT
TUBING CIL FLEX 10 FLL-RA (TUBING) IMPLANT
WIRE G HI TQ BMW 190 (WIRE) IMPLANT

## 2023-11-06 NOTE — Plan of Care (Signed)
  Problem: Education: Goal: Ability to describe self-care measures that may prevent or decrease complications (Diabetes Survival Skills Education) will improve Outcome: Progressing Goal: Individualized Educational Video(s) Outcome: Progressing   Problem: Coping: Goal: Ability to adjust to condition or change in health will improve Outcome: Progressing   Problem: Fluid Volume: Goal: Ability to maintain a balanced intake and output will improve Outcome: Progressing   Problem: Health Behavior/Discharge Planning: Goal: Ability to identify and utilize available resources and services will improve Outcome: Progressing Goal: Ability to manage health-related needs will improve Outcome: Progressing   Problem: Metabolic: Goal: Ability to maintain appropriate glucose levels will improve Outcome: Progressing   Problem: Nutritional: Goal: Maintenance of adequate nutrition will improve Outcome: Progressing Goal: Progress toward achieving an optimal weight will improve Outcome: Progressing   Problem: Skin Integrity: Goal: Risk for impaired skin integrity will decrease Outcome: Progressing   Problem: Tissue Perfusion: Goal: Adequacy of tissue perfusion will improve Outcome: Progressing   Problem: Education: Goal: Understanding of cardiac disease, CV risk reduction, and recovery process will improve Outcome: Progressing Goal: Individualized Educational Video(s) Outcome: Progressing   Problem: Activity: Goal: Ability to tolerate increased activity will improve Outcome: Progressing   Problem: Cardiac: Goal: Ability to achieve and maintain adequate cardiovascular perfusion will improve Outcome: Progressing   Problem: Health Behavior/Discharge Planning: Goal: Ability to safely manage health-related needs after discharge will improve Outcome: Progressing   Problem: Education: Goal: Knowledge of General Education information will improve Description: Including pain rating scale,  medication(s)/side effects and non-pharmacologic comfort measures Outcome: Progressing   Problem: Health Behavior/Discharge Planning: Goal: Ability to manage health-related needs will improve Outcome: Progressing   Problem: Clinical Measurements: Goal: Ability to maintain clinical measurements within normal limits will improve Outcome: Progressing Goal: Will remain free from infection Outcome: Progressing Goal: Diagnostic test results will improve Outcome: Progressing Goal: Respiratory complications will improve Outcome: Progressing Goal: Cardiovascular complication will be avoided Outcome: Progressing   Problem: Activity: Goal: Risk for activity intolerance will decrease Outcome: Progressing   Problem: Nutrition: Goal: Adequate nutrition will be maintained Outcome: Progressing   Problem: Coping: Goal: Level of anxiety will decrease Outcome: Progressing   Problem: Elimination: Goal: Will not experience complications related to bowel motility Outcome: Progressing Goal: Will not experience complications related to urinary retention Outcome: Progressing   Problem: Pain Management: Goal: General experience of comfort will improve Outcome: Progressing   Problem: Safety: Goal: Ability to remain free from injury will improve Outcome: Progressing   Problem: Skin Integrity: Goal: Risk for impaired skin integrity will decrease Outcome: Progressing   Problem: Education: Goal: Understanding of CV disease, CV risk reduction, and recovery process will improve Outcome: Progressing Goal: Individualized Educational Video(s) Outcome: Progressing   Problem: Activity: Goal: Ability to return to baseline activity level will improve Outcome: Progressing   Problem: Cardiovascular: Goal: Ability to achieve and maintain adequate cardiovascular perfusion will improve Outcome: Progressing Goal: Vascular access site(s) Level 0-1 will be maintained Outcome: Progressing   Problem:  Health Behavior/Discharge Planning: Goal: Ability to safely manage health-related needs after discharge will improve Outcome: Progressing

## 2023-11-06 NOTE — Progress Notes (Signed)
Pt. Continues to have low CP "continuous" about a '2' /10. Pt. States he does not feel like he needs Dilaudid at present. Pt. Able to eat, void. Right wrist without any complications at present. Report called to floor RN.

## 2023-11-06 NOTE — CV Procedure (Signed)
Brief Cath/PCI note  NSTEMI/CAD  Right radial approach  Left ventriculogram EF=50-55% Mild inferior hypo-  Coronaries Left main free of disease LAD large 95% mid LAD TIMI-3 flow Circumflex large minor irregularities RCA large 70% proximal ,100% distal RCA 100% distal TIMI I flow Faint collaterals right to right left to right  Intervention Successful PCI and stent to proximal and distal RCA  Successful stent proximal RCA 3.0 x 30 mm Frontier Onyx to 21 atm  Successful PCI and stent of distal RCA 2.0 x 22 mm Frontier Onyx up to 14 atm  Patient on aspirin Brilinta for 12 months  Recommend stage intervention of LAD prior to discharge We were unable to proceed with LAD  second vessel because of dye load of over 200  Patient tolerated procedure well No complications To moderate chest pain at the end of the case responsive to NTG Patient transferred to specials recovery  Case discussed with patient and family

## 2023-11-06 NOTE — Progress Notes (Signed)
[facility] CLINIC CARDIOLOGY PROGRESS NOTE   Patient ID: Kevin Reid MRN: [id number] DOB/AGE: 1961/12/16 62 y.o.  Admit date: 11/02/2023 Referring Physician Dr Renae Gloss Primary Physician Kandyce Rud, MD Primary Cardiologist Dr Juliann Pares Reason for Consultation NSTEMI   HPI: Kevin Reid is a 62 y.o. male with a past medical history of hypertension and hyperlipidemia who presented to the ED on 11/02/2023 for chest pain. Patient noted to have elevated troponins with persistent chest pain. He has not seen cardiology in a few years now. Cardiology was consulted for further evaluation.   Interval History:  -Patient seen and examined this AM with wife at bedside. Resting comfortably.  -Denies any CP, SOB, or palpitations this AM.  -BP and HR controlled. Bradycardic at baseline  Review of systems complete and found to be negative unless listed above    Vitals:   11/05/23 2328 11/06/23 0414 11/06/23 0415 11/06/23 0747  BP: (!) 158/98 (!) 153/94 (!) 153/94 (!) 127/91  Pulse: (!) 58 60 61 (!) 54  Resp: 18 18 18 19   Temp: 97.6 F (36.4 C) 97.9 F (36.6 C) 97.9 F (36.6 C) 97.8 F (36.6 C)  TempSrc:  Oral Oral Oral  SpO2: 95% 96% 96% 99%  Weight:      Height:         Intake/Output Summary (Last 24 hours) at 11/06/2023 5284 Last data filed at 11/06/2023 1324 Gross per 24 hour  Intake 850.4 ml  Output 700 ml  Net 150.4 ml     PHYSICAL EXAM General: awake, well nourished, in no acute distress laying at a slight incline in hospital bed with wife present at bedside. HEENT: Normocephalic and atraumatic. Neck: No JVD.  Lungs: Normal respiratory effort on room air. Clear bilaterally to auscultation. No wheezes, crackles, rhonchi.  Heart: HRRR. Normal S1 and S2 without gallops or murmurs. Radial & DP pulses 2+ bilaterally. Abdomen: Non-distended appearing.  Msk: Normal strength and tone for age. Extremities: No clubbing, cyanosis or edema.   Neuro: Alert and oriented X  3. Psych: Mood appropriate, affect congruent.    LABS: Basic Metabolic Panel: Recent Labs    11/05/23 0555 11/06/23 0350  NA 134* 133*  K 3.5 3.5  CL 103 103  CO2 22 23  GLUCOSE 124* 128*  BUN 24* 25*  CREATININE 1.01 1.10  CALCIUM 9.7 10.0   Liver Function Tests: No results for input(s): "AST", "ALT", "ALKPHOS", "BILITOT", "PROT", "ALBUMIN" in the last 72 hours. No results for input(s): "LIPASE", "AMYLASE" in the last 72 hours. CBC: Recent Labs    11/05/23 0555 11/06/23 0350  WBC 6.6 7.8  HGB 15.1 15.5  HCT 42.2 43.6  MCV 87.0 85.7  PLT 170 183   Cardiac Enzymes: No results for input(s): "CKTOTAL", "CKMB", "CKMBINDEX", "TROPONINIHS" in the last 72 hours.  BNP: No results for input(s): "BNP" in the last 72 hours. D-Dimer: No results for input(s): "DDIMER" in the last 72 hours. Hemoglobin A1C: No results for input(s): "HGBA1C" in the last 72 hours.  Fasting Lipid Panel: No results for input(s): "CHOL", "HDL", "LDLCALC", "TRIG", "CHOLHDL", "LDLDIRECT" in the last 72 hours.  Thyroid Function Tests: No results for input(s): "TSH", "T4TOTAL", "T3FREE", "THYROIDAB" in the last 72 hours.  Invalid input(s): "FREET3"  Anemia Panel: No results for input(s): "VITAMINB12", "FOLATE", "FERRITIN", "TIBC", "IRON", "RETICCTPCT" in the last 72 hours.  No results found.   ECHO pending review  TELEMETRY reviewed by me 11/06/23: sinus bradycardia rate 50s  EKG reviewed by me 11/06/23: Sinus bradycardia with left  axis deviation, voltage criteria for LVH. Cannot rule out Anterior infarct , age undetermined. ST & T wave abnormality, consider lateral ischemia  DATA reviewed by me 11/06/23: last 24h vitals tele labs imaging I/O provider notes   Principal Problem:   NSTEMI (non-ST elevated myocardial infarction) [facility]) Active Problems:   Type 2 diabetes mellitus with hyperlipidemia (HCC)   Hyperlipidemia, unspecified   Hypertension   Sleep apnea   Obesity (BMI 30-39.9)    Hyponatremia    ASSESSMENT AND PLAN: Kevin Reid is a 62 y.o. male with a past medical history of hypertension and hyperlipidemia who presented to the ED on 11/02/2023 for chest pain. Patient noted to have elevated troponins with persistent chest pain. He has not seen cardiology in a few years now. Cardiology was consulted for further evaluation.   # NSTEMI # Hypertension # Hyperlipidemia Patient presented with chest pain with radiation to arms/shoulders. Troponins mildly elevated and flat, trended 144 > 168 > 181. Placed on heparin in the ED.  -Continue heparin gtt. Plan for LHC today -PRN nitroglycerin gtt for persistent chest pain. -Continue atorvastatin 80 mg daily, fenofibrate, and aspirin 81 mg daily.  -Continue amlodipine 10 mg daily, bisoprolol 2.5 mg daily, hydrochlorothiazide 25 mg daily, irbesartan 300 mg daily. -Discussed the risks and benefits of proceeding with LHC for further evaluation with the patient.  He is agreeable to proceed.  NPO until LHC this afternoon (11/12) with Dr. Juliann Pares.  Written consent will be obtained.  Further recommendations following LHC.     This patient's plan of care was discussed and created with Dr. Juliann Pares and he is in agreement.    Signed: Gale Journey, PA-C 11/06/2023, 9:53 AM [facility]

## 2023-11-06 NOTE — Assessment & Plan Note (Signed)
Continue to monitor

## 2023-11-06 NOTE — Progress Notes (Signed)
Update from cardiology.  Patient had a stent to the RCA today and has a LAD lesion which cardiology will go back and stent tomorrow.

## 2023-11-06 NOTE — Consult Note (Addendum)
Pharmacy Consult Note - Anticoagulation  Pharmacy Consult for heparin Indication: chest pain/ACS  PATIENT MEASUREMENTS: Height: 5\' 10"  (177.8 cm) Weight: 95 kg (209 lb 8 oz) IBW/kg (Calculated) : 73 HEPARIN DW (KG): 93.5   Recent Labs    11/06/23 0350  HGB 15.5  HCT 43.6  PLT 183  HEPARINUNFRC 0.36  CREATININE 1.10    Estimated Creatinine Clearance: 81.6 mL/min (by C-G formula based on SCr of 1.1 mg/dL).  PAST MEDICAL HISTORY: Past Medical History:  Diagnosis Date   Allergy    Depression    Diabetes mellitus without complication (HCC)    GERD (gastroesophageal reflux disease)    Heart murmur    Hyperlipidemia    Hypertension    Joint pain    Sleep apnea     ASSESSMENT: 62 y.o. male with PMH including GERD, HTN is presenting with exertional chest pain. Patient is not on chronic anticoagulation per chart review. ECG exhibiting possible ST-T changes and cTn elevated at 144. Pharmacy has been consulted to initiate and manage heparin intravenous infusion.  Pertinent medications: No chronic anticoagulation PTA per chart review  Goal(s) of therapy: Heparin level 0.3 - 0.7 units/mL Monitor platelets by anticoagulation protocol: Yes   Baseline anticoagulation labs: Recent Labs    11/04/23 0515 11/05/23 0555 11/06/23 0350  HGB 14.0 15.1 15.5  PLT 161 170 183    Date Time aPTT/HL Rate/Comment 11/9     0028      0.56               Therapeutic x 1  11/9     0501      0.16               SUBtherapeutic  11/9 1304   0.47  Therapeutic x 1 11/9 2044   0.26  SUBtherapeutic 11/10   0515      0.45               Therapeutic x 1 11/10 1047   0.36   Therapeutic x 2  11/11    0555     0.46                Therapeutic X 3 11/12 0350   0.36   Therapeutic x 4  PLAN: --Heparin level is therapeutic x 4 --Continue heparin infusion at 1800 units/hr --Re-check HL tomorrow AM --Daily CBC per protocol while on IV heparin  Otelia Sergeant, PharmD, [facility] 11/06/2023 4:39 AM

## 2023-11-06 NOTE — Progress Notes (Signed)
Progress Note   Patient: Kevin Reid GNF:[id number] DOB: December 08, 1961 DOA: 11/02/2023     4 DOS: the patient was seen and examined on 11/06/2023   Brief hospital course: 62 year old man past medical history of hypertension hyperlipidemia type 2 diabetes mellitus, sleep apnea on CPAP presents to the ED secondary to chest pain.  For months patient has been having some discomfort.  In August the PCP put him on Prilosec which seemed to help a little bit but patient continued having chest pains on and off recently having chest pain going up a flight of stairs.  He came to the ER and found to have an elevated troponin and started on heparin infusion.  11/9.  Patient still has some intermittent chest pain.  Troponin went up to 181.  Continue heparin drip and aspirin and Lipitor. 11/10.  Blood pressure elevated today started Norvasc.  Patient will have cardiac catheterization tomorrow. 11/11.  Cardiac catheterization got pushed off till Tuesday secondary to busy cardiac cath schedule.  Low-dose bisoprolol restarted. 11/12.  Patient had some chest pain yesterday.  Awaiting cardiac catheterization today.  Assessment and Plan: * NSTEMI (non-ST elevated myocardial infarction) (HCC) Last troponin up at 181.  Continue heparin drip.  Continue aspirin, low-dose bisoprolol and Lipitor.  Cardiac catheterization today.  Hypertension Patient on Avapro, hydrochlorothiazide, low-dose bisoprolol and Norvasc.  Type 2 diabetes mellitus with hyperlipidemia (HCC) Last A1c of 6.3% in April 2024.  Continue sliding scale insulin.  Last 2 sugar 125 and 95.  LDL 74.  Continue high-dose Lipitor.  Obesity (BMI 30-39.9) BMI 30.06.  Sleep apnea CPAP at bedtime  Hypokalemia Continue to monitor  Hyponatremia Sodium 2 point less than normal range.  Watch while on hydrochlorothiazide.        Subjective: Patient had a little chest pain yesterday.  Awaiting cardiac catheterization today.  Admitted with  NSTEMI.  Physical Exam: Vitals:   11/06/23 0415 11/06/23 0747 11/06/23 1136 11/06/23 1150  BP: (!) 153/94 (!) 127/91 131/85 125/86  Pulse: 61 (!) 54 (!) 49 (!) 49  Resp: 18 19 16 17   Temp: 97.9 F (36.6 C) 97.8 F (36.6 C) 98.2 F (36.8 C) 98.1 F (36.7 C)  TempSrc: Oral Oral Oral Oral  SpO2: 96% 99% 99% 97%  Weight:      Height:       Physical Exam HENT:     Head: Normocephalic.     Mouth/Throat:     Pharynx: No oropharyngeal exudate.  Eyes:     General: Lids are normal.     Conjunctiva/sclera: Conjunctivae normal.  Cardiovascular:     Rate and Rhythm: Normal rate and regular rhythm.     Heart sounds: Normal heart sounds, S1 normal and S2 normal.  Pulmonary:     Breath sounds: No decreased breath sounds, wheezing, rhonchi or rales.  Abdominal:     Palpations: Abdomen is soft.     Tenderness: There is no abdominal tenderness.  Musculoskeletal:     Right lower leg: No swelling.     Left lower leg: No swelling.  Skin:    General: Skin is warm.     Findings: No rash.  Neurological:     Mental Status: He is alert and oriented to person, place, and time.     Data Reviewed: Sodium 133, potassium 3.5, creatinine 1.1, CBC showing white blood cell count 7.8 hemoglobin 15.5 and platelet count 183  Family Communication: Wife at bedside  Disposition: Status is: Inpatient Remains inpatient appropriate because: Cardiac catheterization today  Planned Discharge Destination: Home    Time spent: 27 minutes  Author: Alford Highland, MD 11/06/2023 1:47 PM  For on call review [email or web address].

## 2023-11-06 NOTE — Progress Notes (Signed)
Pt. Had continued CP from Cath lab. Pt. Med. With 3 NTG SL over 15 min. Period. MD made aware. Pt. & wife aware of 2nd lesion that needs to be "fixed" tomorrow per MD. CP down to a '2" on scale 1-10 post 3 NTG. Pt. States "it's not bad at all." Dilauded ordered prn if pt. Needs it & pt. Aware. BP dropped briefly post NTG , then recovered to 100-120/systolic. No acute distress. 12 lead EKG completed.

## 2023-11-06 NOTE — Plan of Care (Signed)
  Problem: Education: Goal: Ability to describe self-care measures that may prevent or decrease complications (Diabetes Survival Skills Education) will improve Outcome: Progressing Goal: Individualized Educational Video(s) Outcome: Progressing   Problem: Metabolic: Goal: Ability to maintain appropriate glucose levels will improve Outcome: Progressing   Problem: Nutritional: Goal: Maintenance of adequate nutrition will improve Outcome: Progressing Goal: Progress toward achieving an optimal weight will improve Outcome: Progressing   Problem: Cardiac: Goal: Ability to achieve and maintain adequate cardiovascular perfusion will improve Outcome: Progressing

## 2023-11-07 ENCOUNTER — Encounter: Payer: Self-pay | Admitting: Internal Medicine

## 2023-11-07 ENCOUNTER — Other Ambulatory Visit (HOSPITAL_COMMUNITY): Payer: Self-pay

## 2023-11-07 ENCOUNTER — Encounter
Admission: EM | Disposition: A | Discharge status: Home / Self Care | Payer: Self-pay | Source: Home / Self Care | Attending: Internal Medicine

## 2023-11-07 HISTORY — PX: CORONARY STENT INTERVENTION: CATH118234

## 2023-11-07 LAB — CBC
HCT: 40.8 % (ref 39.0–52.0)
Hemoglobin: 14.4 g/dL (ref 13.0–17.0)
MCH: 31.1 pg (ref 26.0–34.0)
MCHC: 35.3 g/dL (ref 30.0–36.0)
MCV: 88.1 fL (ref 80.0–100.0)
Platelets: 180 10*3/uL (ref 150–400)
RBC: 4.63 MIL/uL (ref 4.22–5.81)
RDW: 12 % (ref 11.5–15.5)
WBC: 8.1 10*3/uL (ref 4.0–10.5)
nRBC: 0 % (ref 0.0–0.2)

## 2023-11-07 LAB — GLUCOSE, CAPILLARY
Glucose-Capillary: 118 mg/dL — ABNORMAL HIGH (ref 70–99)
Glucose-Capillary: 117 mg/dL — ABNORMAL HIGH (ref 70–99)
Glucose-Capillary: 102 mg/dL — ABNORMAL HIGH (ref 70–99)
Glucose-Capillary: 88 mg/dL (ref 70–99)
Glucose-Capillary: 138 mg/dL — ABNORMAL HIGH (ref 70–99)

## 2023-11-07 LAB — BASIC METABOLIC PANEL
Anion gap: 11 (ref 5–15)
BUN: 25 mg/dL — ABNORMAL HIGH (ref 8–23)
CO2: 23 mmol/L (ref 22–32)
Calcium: 9.3 mg/dL (ref 8.9–10.3)
Chloride: 100 mmol/L (ref 98–111)
Creatinine, Ser: 0.91 mg/dL (ref 0.61–1.24)
GFR, Estimated: >60 mL/min (ref >60)
Glucose, Bld: 132 mg/dL — ABNORMAL HIGH (ref 70–99)
Potassium: 3.5 mmol/L (ref 3.5–5.1)
Sodium: 134 mmol/L — ABNORMAL LOW (ref 135–145)

## 2023-11-07 LAB — POCT ACTIVATED CLOTTING TIME: Activated Clotting Time: 406 s

## 2023-11-07 SURGERY — CORONARY STENT INTERVENTION
Anesthesia: Moderate Sedation

## 2023-11-07 MED ORDER — HEPARIN (PORCINE) IN NACL 1000-0.9 UT/500ML-% IV SOLN
INTRAVENOUS | Status: AC
  Filled 2023-11-07: qty 1000

## 2023-11-07 MED ORDER — TICAGRELOR 90 MG PO TABS
90.0000 mg | ORAL_TABLET | Freq: Two times a day (BID) | ORAL | Status: DC
  Administered 2023-11-07 – 2023-11-08 (×2): 90 mg via ORAL
  Filled 2023-11-07 (×2): qty 1

## 2023-11-07 MED ORDER — MIDAZOLAM HCL 2 MG/2ML IJ SOLN
INTRAMUSCULAR | Status: DC | PRN
  Administered 2023-11-07: 1 mg via INTRAVENOUS

## 2023-11-07 MED ORDER — FENTANYL CITRATE (PF) 100 MCG/2ML IJ SOLN
INTRAMUSCULAR | Status: DC | PRN
  Administered 2023-11-07: 50 ug via INTRAVENOUS

## 2023-11-07 MED ORDER — MIDAZOLAM HCL 2 MG/2ML IJ SOLN
INTRAMUSCULAR | Status: AC
  Filled 2023-11-07: qty 2

## 2023-11-07 MED ORDER — TICAGRELOR 90 MG PO TABS
ORAL_TABLET | ORAL | Status: DC | PRN
  Administered 2023-11-07: 90 mg via ORAL

## 2023-11-07 MED ORDER — HEPARIN SODIUM (PORCINE) 1000 UNIT/ML IJ SOLN
INTRAMUSCULAR | Status: AC
Start: 2023-11-07 — End: ?
  Filled 2023-11-07: qty 10

## 2023-11-07 MED ORDER — VERAPAMIL HCL 2.5 MG/ML IV SOLN
INTRAVENOUS | Status: DC | PRN
  Administered 2023-11-07: 2.5 mg via INTRA_ARTERIAL

## 2023-11-07 MED ORDER — TICAGRELOR 90 MG PO TABS
ORAL_TABLET | ORAL | Status: AC
Start: 2023-11-07 — End: ?
  Filled 2023-11-07: qty 1

## 2023-11-07 MED ORDER — HEPARIN SODIUM (PORCINE) 1000 UNIT/ML IJ SOLN
INTRAMUSCULAR | Status: AC
  Filled 2023-11-07: qty 10

## 2023-11-07 MED ORDER — FENTANYL CITRATE (PF) 100 MCG/2ML IJ SOLN
INTRAMUSCULAR | Status: AC
  Filled 2023-11-07: qty 2

## 2023-11-07 MED ORDER — ASPIRIN 81 MG PO CHEW
81.0000 mg | CHEWABLE_TABLET | Freq: Every day | ORAL | Status: DC
  Administered 2023-11-08: 81 mg via ORAL
  Filled 2023-11-07: qty 1

## 2023-11-07 MED ORDER — HEPARIN (PORCINE) IN NACL 2000-0.9 UNIT/L-% IV SOLN
INTRAVENOUS | Status: DC | PRN
  Administered 2023-11-07: 1000 mL

## 2023-11-07 MED ORDER — HEPARIN SODIUM (PORCINE) 1000 UNIT/ML IJ SOLN
INTRAMUSCULAR | Status: DC | PRN
  Administered 2023-11-07: 12000 [IU] via INTRAVENOUS

## 2023-11-07 MED ORDER — SODIUM CHLORIDE 0.9 % WEIGHT BASED INFUSION
1.0000 mL/kg/h | INTRAVENOUS | Status: DC
  Administered 2023-11-07: 1 mL/kg/h via INTRAVENOUS

## 2023-11-07 MED ORDER — VERAPAMIL HCL 2.5 MG/ML IV SOLN
INTRAVENOUS | Status: AC
  Filled 2023-11-07: qty 2

## 2023-11-07 MED ORDER — IOHEXOL 300 MG/ML  SOLN
INTRAMUSCULAR | Status: DC | PRN
  Administered 2023-11-07: 150 mL

## 2023-11-07 SURGICAL SUPPLY — 19 items
BALLN TREK RX 2.5X15 (BALLOONS) ×1
BALLN ~~LOC~~ TREK NEO RX 3.0X20 (BALLOONS) ×1
BALLOON TREK RX 2.5X15 (BALLOONS) IMPLANT
BALLOON ~~LOC~~ TREK NEO RX 3.0X20 (BALLOONS) IMPLANT
CATH INFINITI JR4 5F (CATHETERS) IMPLANT
CATH VISTA GUIDE 6FR XBLAD3.5 (CATHETERS) IMPLANT
DEVICE RAD TR BAND REGULAR (VASCULAR PRODUCTS) IMPLANT
DRAPE BRACHIAL (DRAPES) IMPLANT
GLIDESHEATH SLEND SS 6F .021 (SHEATH) IMPLANT
GUIDEWIRE INQWIRE 1.5J.035X260 (WIRE) IMPLANT
INQWIRE 1.5J .035X260CM (WIRE) ×1
KIT ENCORE 26 ADVANTAGE (KITS) IMPLANT
PACK CARDIAC CATH (CUSTOM PROCEDURE TRAY) ×1 IMPLANT
PROTECTION STATION PRESSURIZED (MISCELLANEOUS) ×1
SET ATX-X65L (MISCELLANEOUS) IMPLANT
STATION PROTECTION PRESSURIZED (MISCELLANEOUS) IMPLANT
STENT ONYX FRONTIER 2.75X30 (Permanent Stent) IMPLANT
TUBING CIL FLEX 10 FLL-RA (TUBING) IMPLANT
WIRE G HI TQ BMW 190 (WIRE) IMPLANT

## 2023-11-07 NOTE — Progress Notes (Signed)
  Progress Note   Patient: Kevin Reid [id number] DOB: [birth year] DOA: 11/02/2023     5 DOS: the patient was seen and examined on 11/07/2023   Brief hospital course: 62 year old man past medical history of hypertension hyperlipidemia type 2 diabetes mellitus, sleep apnea on CPAP presents to the ED secondary to chest pain.  For months patient has been having some discomfort.  In August the PCP put him on Prilosec which seemed to help a little bit but patient continued having chest pains on and off recently having chest pain going up a flight of stairs.  He came to the ER and found to have an elevated troponin and started on heparin infusion.  Cardiac cath was performed on 11/12, showed two-vessel disease involving 95% LAD, RCA 70% proximal, 100% distal.  Patient had a drug-eluting stent placed into RCA on 11/12.  Treated with aspirin and Brilinta. Scheduled LAD stent 11/13.   Principal Problem:   NSTEMI (non-ST elevated myocardial infarction) (HCC) Active Problems:   Hypertension   Type 2 diabetes mellitus with hyperlipidemia (HCC)   Obesity (BMI 30-39.9)   Sleep apnea   Hyperlipidemia, unspecified   Hyponatremia   Hypokalemia   Assessment and Plan: * NSTEMI (non-ST elevated myocardial infarction) (HCC) Status post drug-eluting stent into RCA, pending LAD stent today. Continue current treatment with statin, aspirin and Brilinta.  Hypertension Patient on Avapro, low-dose bisoprolol and Norvasc. Discontinue HCTZ while in the hospital.  Type 2 diabetes mellitus with hyperlipidemia (HCC) Last A1c of 6.3% in April 2024.  Continue sliding scale insulin.  Last 2 sugar 125 and 95.  LDL 74.  Continue high-dose Lipitor.  Obesity (BMI 30-39.9) BMI 30.06.  Sleep apnea CPAP at bedtime  Hypokalemia Hyponatremia Recheck levels tomorrow.       Subjective:  Patient no longer has any chest pain.  Physical Exam: Vitals:   11/07/23 0333 11/07/23 0809 11/07/23 1201 11/07/23  1320  BP: (!) 126/91 (!) 138/92 (!) 141/94   Pulse: (!) 59 61 (!) 56 (!) 59  Resp: 20 18 20 13   Temp: 97.6 F (36.4 C) 98.1 F (36.7 C) 98.1 F (36.7 C) 98 F (36.7 C)  TempSrc:  Oral Oral Oral  SpO2: 98% 96% 98% 95%  Weight:      Height:       General exam: Appears calm and comfortable  Respiratory system: Clear to auscultation. Respiratory effort normal. Cardiovascular system: S1 & S2 heard, RRR. No JVD, murmurs, rubs, gallops or clicks. No pedal edema. Gastrointestinal system: Abdomen is nondistended, soft and nontender. No organomegaly or masses felt. Normal bowel sounds heard. Central nervous system: Alert and oriented. No focal neurological deficits. Extremities: Symmetric 5 x 5 power. Skin: No rashes, lesions or ulcers Psychiatry: Judgement and insight appear normal. Mood & affect appropriate.    Data Reviewed:  Lab results reviewed.  Family Communication: Wife updated at the bedside.  Disposition: Status is: Inpatient Remains inpatient appropriate because: Severity of disease, IV treatment, inpatient procedure.     Time spent: 35 minutes  Author: Marrion Coy, MD 11/07/2023 1:55 PM  For on call review [email or web address].

## 2023-11-07 NOTE — OR Nursing (Signed)
Dr Juliann Pares aware of tr band bleeding holding deflation additional 30 minutes

## 2023-11-07 NOTE — Progress Notes (Signed)
[facility] CARDIOLOGY PROGRESS NOTE   Patient ID: Kevin Reid MRN: [id number] DOB/AGE: 07-23-1961 62 y.o.  Admit date: 11/02/2023 Referring Physician Dr Renae Gloss Primary Physician Kandyce Rud, MD Primary Cardiologist Dr Juliann Pares Reason for Consultation NSTEMI   HPI: [name] is a 62 y.o. male with a past medical history of hypertension and hyperlipidemia who presented to the ED on 11/02/2023 for chest pain. Patient noted to have elevated troponins with persistent chest pain. He has not seen cardiology in a few years now. Cardiology was consulted for further evaluation.   Interval History:  -Patient seen and examined this AM. Resting comfortably with CPAP on.  -Endorses mild CP this AM, overall improved from last night.  -BP and HR controlled.  -LHC yesterday with stents to RCA, plan for staged intervention to LAD today.  Review of systems complete and found to be negative unless listed above    Vitals:   11/06/23 2015 11/06/23 2313 11/07/23 0333 11/07/23 0809  BP: (!) 148/88 (!) 157/89 (!) 126/91 (!) 138/92  Pulse: 60 (!) 57 (!) 59 61  Resp: 18 18 20 18   Temp: 98 F (36.7 C) 97.6 F (36.4 C) 97.6 F (36.4 C) 98.1 F (36.7 C)  TempSrc: Oral Oral  Oral  SpO2: 100% 100% 98% 96%  Weight:      Height:         Intake/Output Summary (Last 24 hours) at 11/07/2023 1112 Last data filed at 11/07/2023 1000 Gross per 24 hour  Intake 1547.42 ml  Output 3250 ml  Net -1702.58 ml     PHYSICAL EXAM General: Well nourished male, in no acute distress resting comfortably in hospital bed. HEENT: Normocephalic and atraumatic. Neck: No JVD.  Lungs: Normal respiratory effort on CPAP. Clear bilaterally to auscultation. No wheezes, crackles, rhonchi.  Heart: HRRR. Normal S1 and S2 without gallops or murmurs. Radial & DP pulses 2+ bilaterally. Abdomen: Non-distended appearing.  Msk: Normal strength and tone for age. Extremities: No clubbing, cyanosis or edema.   Neuro:  Alert and oriented X 3. Psych: Mood appropriate, affect congruent.    LABS: Basic Metabolic Panel: Recent Labs    11/06/23 0350 11/07/23 0445  NA 133* 134*  K 3.5 3.5  CL 103 100  CO2 23 23  GLUCOSE 128* 132*  BUN 25* 25*  CREATININE 1.10 0.91  CALCIUM 10.0 9.3   Liver Function Tests: No results for input(s): "AST", "ALT", "ALKPHOS", "BILITOT", "PROT", "ALBUMIN" in the last 72 hours. No results for input(s): "LIPASE", "AMYLASE" in the last 72 hours. CBC: Recent Labs    11/06/23 0350 11/07/23 0445  WBC 7.8 8.1  HGB 15.5 14.4  HCT 43.6 40.8  MCV 85.7 88.1  PLT 183 180   Cardiac Enzymes: No results for input(s): "CKTOTAL", "CKMB", "CKMBINDEX", "TROPONINIHS" in the last 72 hours.  BNP: No results for input(s): "BNP" in the last 72 hours. D-Dimer: No results for input(s): "DDIMER" in the last 72 hours. Hemoglobin A1C: No results for input(s): "HGBA1C" in the last 72 hours.  Fasting Lipid Panel: No results for input(s): "CHOL", "HDL", "LDLCALC", "TRIG", "CHOLHDL", "LDLDIRECT" in the last 72 hours.  Thyroid Function Tests: No results for input(s): "TSH", "T4TOTAL", "T3FREE", "THYROIDAB" in the last 72 hours.  Invalid input(s): "FREET3"  Anemia Panel: No results for input(s): "VITAMINB12", "FOLATE", "FERRITIN", "TIBC", "IRON", "RETICCTPCT" in the last 72 hours.  ECHOCARDIOGRAM COMPLETE  Result Date: 11/06/2023    ECHOCARDIOGRAM REPORT   Patient Name:   Kevin Reid Date of Exam: 11/05/2023 Medical Rec #:  [id number]       Height:       70.0 in Accession #:    [id number]      Weight:       209.5 lb Date of Birth:  27-Jan-1961       BSA:          2.129 m Patient Age:    62 years        BP:           161/89 mmHg Patient Gender: M               HR:           68 bpm. Exam Location:  [facility] Procedure: 2D Echo, Cardiac Doppler, Color Doppler and Strain Analysis Indications:     NSTEMI  History:         Patient has no prior history of Echocardiogram examinations.                   Acute MI, Signs/Symptoms:Edema; Risk Factors:Hypertension,                  Sleep Apnea, Diabetes and Dyslipidemia.  Sonographer:     Mikki Harbor Referring Phys:  [id number] Verdene Lennert Diagnosing Phys: Alwyn Pea MD  Sonographer Comments: Global longitudinal strain was attempted. IMPRESSIONS  1. Left ventricular ejection fraction, by estimation, is 50 to 55%. The left ventricle has low normal function. The left ventricle has no regional wall motion abnormalities. There is mild left ventricular hypertrophy. Left ventricular diastolic parameters are consistent with Grade I diastolic dysfunction (impaired relaxation).  2. Right ventricular systolic function is normal. The right ventricular size is normal.  3. The mitral valve is normal in structure. No evidence of mitral valve regurgitation.  4. The aortic valve is normal in structure. Aortic valve regurgitation is not visualized. FINDINGS  Left Ventricle: Left ventricular ejection fraction, by estimation, is 50 to 55%. The left ventricle has low normal function. The left ventricle has no regional wall motion abnormalities. The left ventricular internal cavity size was normal in size. There is mild left ventricular hypertrophy. Left ventricular diastolic parameters are consistent with Grade I diastolic dysfunction (impaired relaxation). Right Ventricle: The right ventricular size is normal. No increase in right ventricular wall thickness. Right ventricular systolic function is normal. Left Atrium: Left atrial size was normal in size. Right Atrium: Right atrial size was normal in size. Pericardium: There is no evidence of pericardial effusion. Mitral Valve: The mitral valve is normal in structure. No evidence of mitral valve regurgitation. MV peak gradient, 3.5 mmHg. The mean mitral valve gradient is 1.0 mmHg. Tricuspid Valve: The tricuspid valve is normal in structure. Tricuspid valve regurgitation is trivial. Aortic Valve: The aortic valve is normal in  structure. Aortic valve regurgitation is not visualized. Aortic valve mean gradient measures 4.0 mmHg. Aortic valve peak gradient measures 7.2 mmHg. Aortic valve area, by VTI measures 3.71 cm. Pulmonic Valve: The pulmonic valve was normal in structure. Pulmonic valve regurgitation is not visualized. Aorta: The ascending aorta was not well visualized. IAS/Shunts: No atrial level shunt detected by color flow Doppler.  LEFT VENTRICLE PLAX 2D LVIDd:         5.20 cm      Diastology LVIDs:         3.60 cm      LV e' medial:    4.79 cm/s LV PW:         1.30 cm  LV E/e' medial:  8.4 LV IVS:        1.30 cm      LV e' lateral:   10.60 cm/s LVOT diam:     2.30 cm      LV E/e' lateral: 3.8 LV SV:         99 LV SV Index:   47 LVOT Area:     4.15 cm  LV Volumes (MOD) LV vol d, MOD A2C: 105.0 ml LV vol d, MOD A4C: 104.0 ml LV vol s, MOD A2C: 57.5 ml LV vol s, MOD A4C: 50.7 ml LV SV MOD A2C:     47.5 ml LV SV MOD A4C:     104.0 ml LV SV MOD BP:      52.5 ml RIGHT VENTRICLE RV Basal diam:  3.80 cm RV Mid diam:    3.50 cm RV S prime:     15.00 cm/s TAPSE (M-mode): 2.4 cm LEFT ATRIUM             Index        RIGHT ATRIUM           Index LA diam:        4.20 cm 1.97 cm/m   RA Area:     19.60 cm LA Vol (A2C):   69.8 ml 32.79 ml/m  RA Volume:   50.90 ml  23.91 ml/m LA Vol (A4C):   57.9 ml 27.20 ml/m LA Biplane Vol: 63.5 ml 29.83 ml/m  AORTIC VALVE                    PULMONIC VALVE AV Area (Vmax):    3.97 cm     PV Vmax:       1.23 m/s AV Area (Vmean):   3.70 cm     PV Peak grad:  6.1 mmHg AV Area (VTI):     3.71 cm AV Vmax:           134.00 cm/s AV Vmean:          87.100 cm/s AV VTI:            0.268 m AV Peak Grad:      7.2 mmHg AV Mean Grad:      4.0 mmHg LVOT Vmax:         128.00 cm/s LVOT Vmean:        77.500 cm/s LVOT VTI:          0.239 m LVOT/AV VTI ratio: 0.89  AORTA Ao Root diam: 4.00 cm Ao Asc diam:  4.20 cm MITRAL VALVE MV Area (PHT): 2.62 cm    SHUNTS MV Area VTI:   4.53 cm    Systemic VTI:  0.24 m MV Peak  grad:  3.5 mmHg    Systemic Diam: 2.30 cm MV Mean grad:  1.0 mmHg MV Vmax:       0.93 m/s MV Vmean:      43.4 cm/s MV Decel Time: 289 msec MV E velocity: 40.40 cm/s MV A velocity: 78.60 cm/s MV E/A ratio:  0.51 Dwayne Salome Arnt MD Electronically signed by Alwyn Pea MD Signature Date/Time: 11/06/2023/7:04:31 PM    Final    CARDIAC CATHETERIZATION  Result Date: 11/06/2023   Prox RCA to Mid RCA lesion is 70% stenosed.   Dist RCA lesion is 100% stenosed.   Prox LAD lesion is 50% stenosed.   Mid LAD lesion is 95% stenosed.   Dist LAD lesion is 50% stenosed.  A stent was successfully placed.   A drug-eluting stent was successfully placed using a STENT ONYX FRONTIER 2.0X22.   Post intervention, there is a 0% residual stenosis.  Proximal RCA   Post intervention, there is a 0% residual stenosis.  Distal RCA   There is mild left ventricular systolic dysfunction.   LV end diastolic pressure is mildly elevated.   The left ventricular ejection fraction is 45-50% by visual estimate.   In the absence of any other complications or medical issues, we expect the patient to be ready for discharge from an interventional cardiology perspective on 11/08/2023.   Recommend uninterrupted dual antiplatelet therapy with Aspirin 81mg  daily and Ticagrelor 90mg  twice daily for a minimum of 12 months (ACS-Class I recommendation). Conclusion Left heart cath inpatient right radial approach Indication non-STEMI Left ventriculogram Left ventricular function inferior hypoejection fraction about 45 to 50% Coronaries Left main large free of disease LAD large 95% mid diffuse 50% distally TIMI-3 flow Circumflex large minor irregularities RCA large 70% proximal to mid 100% distal TIMI I flow Right dominant system Faint collaterals right to right left to right Intervention Successful PCI and stent to proximal RCA 3.0 x 30 mm frontier Onyx to 21 atm Postdilated with a 3.5 x 20 mm [place] neo to 14 atm Lesion reduced from 70 down to 0% Successful  PCI and stent distal RCA 2.0 x 22 mm frontier Onyx to 14 atm Lesion reduced from 100% down to 0% TIMI-3 flow restored from TIMI I Maintained on aspirin Brilinta for at least 12 months Tolerated procedure well No complications LAD to be staged tomorrow because of dye load today of over 200cc Case discussed with patient family and cardiology team    ECHO as above  TELEMETRY reviewed by me 11/07/23: sinus rhythm rate 60s  EKG reviewed by me 11/07/23: Sinus bradycardia with left axis deviation, voltage criteria for LVH. Cannot rule out Anterior infarct , age undetermined. ST & T wave abnormality, consider lateral ischemia  DATA reviewed by me 11/07/23: last 24h vitals tele labs imaging I/O hospitalist progress notes   Principal Problem:   NSTEMI (non-ST elevated myocardial infarction) [facility] [place]) Active Problems:   Type 2 diabetes mellitus with hyperlipidemia (HCC)   Hyperlipidemia, unspecified   Hypertension   Sleep apnea   Obesity (BMI 30-39.9)   Hyponatremia   Hypokalemia    ASSESSMENT AND PLAN: Joseph Carlstrom is a 62 y.o. male with a past medical history of hypertension and hyperlipidemia who presented to the ED on 11/02/2023 for chest pain. Patient noted to have elevated troponins with persistent chest pain. He has not seen cardiology in a few years now. Cardiology was consulted for further evaluation.   # NSTEMI # Hypertension # Hyperlipidemia Patient presented with chest pain with radiation to arms/shoulders. Troponins mildly elevated and flat, trended 144 > 168 > 181. Placed on heparin in the ED. S/p DES x2 to RCA 11/12.  -PRN nitroglycerin for persistent chest pain. -Continue atorvastatin 80 mg daily, fenofibrate, and aspirin 81 mg daily.  -Continue amlodipine 10 mg daily, bisoprolol 2.5 mg daily, hydrochlorothiazide 25 mg daily, irbesartan 300 mg daily. -Discussed the risks and benefits of proceeding with LHC for further evaluation with the patient.  He is agreeable to proceed.  NPO  until LHC this afternoon (11/13) with Dr. Juliann Pares.  Written consent will be obtained.  Further recommendations following LHC.    # Obstructive sleep apnea Patient with hx of OSA on CPAP.  -Continue strict compliance with CPAP.  This  patient's plan of care was discussed and created with Dr. Juliann Pares and he is in agreement.    Signed: Gale Journey, PA-C 11/07/2023, 11:12 AM [facility] Cardiology

## 2023-11-07 NOTE — Plan of Care (Signed)
Progressing towards goals

## 2023-11-07 NOTE — TOC Benefit Eligibility Note (Addendum)
Patient Product/process development scientist completed.    The patient is insured through [employer]. Patient has [employer], may use a copay card, and/or apply for patient assistance if available.    Ran test claim for Brilinta 90 mg and the current 30 day co-pay is $45.00.   This test claim was processed through [facility]- copay amounts may vary at other pharmacies due to pharmacy/plan contracts, or as the patient moves through the different stages of their insurance plan.     Roland Earl, CPHT Pharmacy Technician III Certified Patient Advocate [facility] Patient Advocate Team Direct Number: [phone]  Fax: [phone]

## 2023-11-08 LAB — CBC
HCT: 41 % (ref 39.0–52.0)
Hemoglobin: 14.2 g/dL (ref 13.0–17.0)
MCH: 30.9 pg (ref 26.0–34.0)
MCHC: 34.6 g/dL (ref 30.0–36.0)
MCV: 89.1 fL (ref 80.0–100.0)
Platelets: 157 10*3/uL (ref 150–400)
RBC: 4.6 MIL/uL (ref 4.22–5.81)
RDW: 12.1 % (ref 11.5–15.5)
WBC: 6.7 10*3/uL (ref 4.0–10.5)
nRBC: 0 % (ref 0.0–0.2)

## 2023-11-08 LAB — BASIC METABOLIC PANEL
Anion gap: 9 (ref 5–15)
BUN: 19 mg/dL (ref 8–23)
CO2: 23 mmol/L (ref 22–32)
Calcium: 8.9 mg/dL (ref 8.9–10.3)
Chloride: 102 mmol/L (ref 98–111)
Creatinine, Ser: 0.93 mg/dL (ref 0.61–1.24)
GFR, Estimated: >60 mL/min (ref >60)
Glucose, Bld: 164 mg/dL — ABNORMAL HIGH (ref 70–99)
Potassium: 3.6 mmol/L (ref 3.5–5.1)
Sodium: 134 mmol/L — ABNORMAL LOW (ref 135–145)

## 2023-11-08 LAB — GLUCOSE, CAPILLARY
Glucose-Capillary: 93 mg/dL (ref 70–99)
Glucose-Capillary: 122 mg/dL — ABNORMAL HIGH (ref 70–99)

## 2023-11-08 MED ORDER — ATORVASTATIN CALCIUM 80 MG PO TABS: 80.0000 mg | ORAL_TABLET | Freq: Every day | ORAL | 0 refills | Status: DC

## 2023-11-08 MED ORDER — ASPIRIN 81 MG PO CHEW: 81.0000 mg | CHEWABLE_TABLET | Freq: Every day | ORAL | 0 refills | Status: AC

## 2023-11-08 MED ORDER — BISOPROLOL FUMARATE 5 MG PO TABS: 2.5000 mg | ORAL_TABLET | Freq: Every day | ORAL | 0 refills | Status: AC

## 2023-11-08 MED ORDER — TICAGRELOR 90 MG PO TABS: 90.0000 mg | ORAL_TABLET | Freq: Two times a day (BID) | ORAL | 0 refills | Status: AC

## 2023-11-08 NOTE — Discharge Summary (Signed)
Physician Discharge Summary   Patient: Kevin Reid MRN: [id number] DOB: 1961-10-20  Admit date:     11/02/2023  Discharge date: 11/08/23  Discharge Physician: Marrion Coy   PCP: Kandyce Rud, MD   Recommendations at discharge:   Follow-up with PCP in 1 week. Follow-up with cardiology as scheduled.  Discharge Diagnoses: Principal Problem:   NSTEMI (non-ST elevated myocardial infarction) (HCC) Active Problems:   Hypertension   Type 2 diabetes mellitus with hyperlipidemia (HCC)   Obesity (BMI 30-39.9)   Sleep apnea   Hyperlipidemia, unspecified   Hyponatremia   Hypokalemia  Resolved Problems:   * No resolved hospital problems. *  Hospital Course: 62 year old man past medical history of hypertension hyperlipidemia type 2 diabetes mellitus, sleep apnea on CPAP presents to the ED secondary to chest pain.  For months patient has been having some discomfort.  In August the PCP put him on Prilosec which seemed to help a little bit but patient continued having chest pains on and off recently having chest pain going up a flight of stairs.  He came to the ER and found to have an elevated troponin and started on heparin infusion.  Cardiac cath was performed on 11/12, showed two-vessel disease involving 95% LAD, RCA 70% proximal, 100% distal.  Patient had a drug-eluting stent placed into RCA on 11/12.  Treated with aspirin and Brilinta.  LAD stent placed 11/13.  Assessment and Plan: * NSTEMI (non-ST elevated myocardial infarction) (HCC) Status post drug-eluting stent into RCA and LAD. Continue current treatment with statin, aspirin and Brilinta.   Hypertension Resume HCTZ, Avapro, low-dose bisoprolol and Norvasc.    Type 2 diabetes mellitus with hyperlipidemia (HCC) Last A1c of 6.3% in April 2024.  Continue sliding scale insulin.  Last 2 sugar 125 and 95.  LDL 74.     Obesity (BMI 30-39.9) BMI 30.06.   Sleep apnea CPAP at bedtime   Hypokalemia Hyponatremia Calcium  normalized, sodium 134.         Consultants: Cardiology. Procedures performed: Heart cath. Disposition: Home Diet recommendation:  Cardiac diet DISCHARGE MEDICATION: Allergies as of 11/08/2023       Reactions   Codeine    unknown   Simvastatin Other (See Comments)   Chest heaviness and numbness on and off (after several days of taking a "statin"        Medication List     STOP taking these medications    albuterol 108 (90 Base) MCG/ACT inhaler Commonly known as: VENTOLIN HFA   atenolol 25 MG tablet Commonly known as: TENORMIN   cetirizine 10 MG tablet Commonly known as: ZYRTEC   fluticasone 50 MCG/ACT nasal spray Commonly known as: FLONASE       TAKE these medications    amLODipine 10 MG tablet Commonly known as: NORVASC Take 1 tablet (10 mg total) by mouth daily.   aspirin 81 MG chewable tablet Chew 1 tablet (81 mg total) by mouth daily. Start taking on: November 09, 2023   atorvastatin 80 MG tablet Commonly known as: LIPITOR Take 1 tablet (80 mg total) by mouth daily. Start taking on: November 09, 2023   bisoprolol 5 MG tablet Commonly known as: ZEBETA Take 0.5 tablets (2.5 mg total) by mouth daily. Start taking on: November 09, 2023   Blood Glucose Monitor System w/Device Kit Use to test blood sugar once daily   Contour Next Test test strip Generic drug: glucose blood Check sugar once daily. DX E11.9   fenofibrate 145 MG tablet Commonly known as:  Tricor Take 1 tablet (145 mg total) by mouth daily.   hydrochlorothiazide 25 MG tablet Commonly known as: HYDRODIURIL Take 25 mg by mouth daily.   Lancets Misc 1 each by Does not apply route daily as needed.   levocetirizine 5 MG tablet Commonly known as: XYZAL Take 5 mg by mouth every evening.   metFORMIN 1000 MG tablet Commonly known as: GLUCOPHAGE Take 1 tablet (1,000 mg total) by mouth 2 (two) times daily.   Mounjaro 7.5 MG/0.5ML Pen Generic drug: tirzepatide Inject 7.5 mg  into the skin once a week. Sunday   NON FORMULARY CPAP MACHINE USING EVERY NIGHT setting at 10   omeprazole 40 MG capsule Commonly known as: PRILOSEC Take by mouth.   PARoxetine 20 MG tablet Commonly known as: PAXIL TAKE 3 TABLETS BY MOUTH ONCE DAILY   telmisartan 80 MG tablet Commonly known as: MICARDIS Take 80 mg by mouth daily.   ticagrelor 90 MG Tabs tablet Commonly known as: BRILINTA Take 1 tablet (90 mg total) by mouth 2 (two) times daily.        Follow-up Information     Callwood, Dwayne D, MD. Go in 1 week(s).   Specialties: Cardiology, Internal Medicine Why: appointment for 11/15/23 @ 3.15pm Contact information: [address] [place] [address] [phone]         Babaoff, Marcus, MD Follow up in 1 week(s).   Specialty: Family Medicine Contact information: [address] [facility] [place] [place] [address] [phone]                Discharge Exam: Filed Weights   11/02/23 1403 11/04/23 0500 11/05/23 0044  Weight: 98.9 kg 92.4 kg 95 kg   General exam: Appears calm and comfortable  Respiratory system: Clear to auscultation. Respiratory effort normal. Cardiovascular system: S1 & S2 heard, RRR. No JVD, murmurs, rubs, gallops or clicks. No pedal edema. Gastrointestinal system: Abdomen is nondistended, soft and nontender. No organomegaly or masses felt. Normal bowel sounds heard. Central nervous system: Alert and oriented. No focal neurological deficits. Extremities: Symmetric 5 x 5 power. Skin: No rashes, lesions or ulcers Psychiatry: Judgement and insight appear normal. Mood & affect appropriate.    Condition at discharge: good  The results of significant diagnostics from this hospitalization (including imaging, microbiology, ancillary and laboratory) are listed below for reference.   Imaging Studies: CARDIAC CATHETERIZATION  Result Date: 11/07/2023   Prox LAD lesion is 50% stenosed.    Mid LAD lesion is 95% stenosed.   Dist LAD lesion is 50% stenosed.   Non-stenotic Prox RCA to Mid RCA lesion was previously treated.   Non-stenotic Dist RCA lesion was previously treated.   A stent was successfully placed.   Post intervention, there is a 0% residual stenosis.   ECHOCARDIOGRAM COMPLETE  Result Date: 11/06/2023    ECHOCARDIOGRAM REPORT   Patient Name:   Kevin Reid Date of Exam: 11/05/2023 Medical Rec #:  [id number]       Height:       70.0 in Accession #:    [id number]      Weight:       209.5 lb Date of Birth:  08/01/1961       BSA:          2.129 m Patient Age:    62 years        BP:           16 1/89 mmHg Patient Gender: Judie Petit  HR:           68 bpm. Exam Location:  [facility] Procedure: 2D Echo, Cardiac Doppler, Color Doppler and Strain Analysis Indications:     NSTEMI  History:         Patient has no prior history of Echocardiogram examinations.                  Acute MI, Signs/Symptoms:Edema; Risk Factors:Hypertension,                  Sleep Apnea, Diabetes and Dyslipidemia.  Sonographer:     Mikki Harbor Referring Phys:  [id number] Verdene Lennert Diagnosing Phys: Alwyn Pea MD  Sonographer Comments: Global longitudinal strain was attempted. IMPRESSIONS  1. Left ventricular ejection fraction, by estimation, is 50 to 55%. The left ventricle has low normal function. The left ventricle has no regional wall motion abnormalities. There is mild left ventricular hypertrophy. Left ventricular diastolic parameters are consistent with Grade I diastolic dysfunction (impaired relaxation).  2. Right ventricular systolic function is normal. The right ventricular size is normal.  3. The mitral valve is normal in structure. No evidence of mitral valve regurgitation.  4. The aortic valve is normal in structure. Aortic valve regurgitation is not visualized. FINDINGS  Left Ventricle: Left ventricular ejection fraction, by estimation, is 50 to 55%. The left ventricle has low normal function. The  left ventricle has no regional wall motion abnormalities. The left ventricular internal cavity size was normal in size. There is mild left ventricular hypertrophy. Left ventricular diastolic parameters are consistent with Grade I diastolic dysfunction (impaired relaxation). Right Ventricle: The right ventricular size is normal. No increase in right ventricular wall thickness. Right ventricular systolic function is normal. Left Atrium: Left atrial size was normal in size. Right Atrium: Right atrial size was normal in size. Pericardium: There is no evidence of pericardial effusion. Mitral Valve: The mitral valve is normal in structure. No evidence of mitral valve regurgitation. MV peak gradient, 3.5 mmHg. The mean mitral valve gradient is 1.0 mmHg. Tricuspid Valve: The tricuspid valve is normal in structure. Tricuspid valve regurgitation is trivial. Aortic Valve: The aortic valve is normal in structure. Aortic valve regurgitation is not visualized. Aortic valve mean gradient measures 4.0 mmHg. Aortic valve peak gradient measures 7.2 mmHg. Aortic valve area, by VTI measures 3.71 cm. Pulmonic Valve: The pulmonic valve was normal in structure. Pulmonic valve regurgitation is not visualized. Aorta: The ascending aorta was not well visualized. IAS/Shunts: No atrial level shunt detected by color flow Doppler.  LEFT VENTRICLE PLAX 2D LVIDd:         5.20 cm      Diastology LVIDs:         3.60 cm      LV e' medial:    4.79 cm/s LV PW:         1.30 cm      LV E/e' medial:  8.4 LV IVS:        1.30 cm      LV e' lateral:   10.60 cm/s LVOT diam:     2.30 cm      LV E/e' lateral: 3.8 LV SV:         99 LV SV Index:   47 LVOT Area:     4.15 cm  LV Volumes (MOD) LV vol d, MOD A2C: 105.0 ml LV vol d, MOD A4C: 104.0 ml LV vol s, MOD A2C: 57.5 ml LV vol s, MOD A4C: 50.7 ml LV  SV MOD A2C:     47.5 ml LV SV MOD A4C:     104.0 ml LV SV MOD BP:      52.5 ml RIGHT VENTRICLE RV Basal diam:  3.80 cm RV Mid diam:    3.50 cm RV S prime:      15.00 cm/s TAPSE (M-mode): 2.4 cm LEFT ATRIUM             Index        RIGHT ATRIUM           Index LA diam:        4.20 cm 1.97 cm/m   RA Area:     19.60 cm LA Vol (A2C):   69.8 ml 32.79 ml/m  RA Volume:   50.90 ml  23.91 ml/m LA Vol (A4C):   57.9 ml 27.20 ml/m LA Biplane Vol: 63.5 ml 29.83 ml/m  AORTIC VALVE                    PULMONIC VALVE AV Area (Vmax):    3.97 cm     PV Vmax:       1.23 m/s AV Area (Vmean):   3.70 cm     PV Peak grad:  6.1 mmHg AV Area (VTI):     3.71 cm AV Vmax:           134.00 cm/s AV Vmean:          87.100 cm/s AV VTI:            0.268 m AV Peak Grad:      7.2 mmHg AV Mean Grad:      4.0 mmHg LVOT Vmax:         128.00 cm/s LVOT Vmean:        77.500 cm/s LVOT VTI:          0.239 m LVOT/AV VTI ratio: 0.89  AORTA Ao Root diam: 4.00 cm Ao Asc diam:  4.20 cm MITRAL VALVE MV Area (PHT): 2.62 cm    SHUNTS MV Area VTI:   4.53 cm    Systemic VTI:  0.24 m MV Peak grad:  3.5 mmHg    Systemic Diam: 2.30 cm MV Mean grad:  1.0 mmHg MV Vmax:       0.93 m/s MV Vmean:      43.4 cm/s MV Decel Time: 289 msec MV E velocity: 40.40 cm/s MV A velocity: 78.60 cm/s MV E/A ratio:  0.51 Dwayne Salome Arnt MD Electronically signed by Alwyn Pea MD Signature Date/Time: 11/06/2023/7:04:31 PM    Final    CARDIAC CATHETERIZATION  Result Date: 11/06/2023   Prox RCA to Mid RCA lesion is 70% stenosed.   Dist RCA lesion is 100% stenosed.   Prox LAD lesion is 50% stenosed.   Mid LAD lesion is 95% stenosed.   Dist LAD lesion is 50% stenosed.   A stent was successfully placed.   A drug-eluting stent was successfully placed using a STENT ONYX FRONTIER 2.0X22.   Post intervention, there is a 0% residual stenosis.  Proximal RCA   Post intervention, there is a 0% residual stenosis.  Distal RCA   There is mild left ventricular systolic dysfunction.   LV end diastolic pressure is mildly elevated.   The left ventricular ejection fraction is 45-50% by visual estimate.   In the absence of any other complications  or medical issues, we expect the patient to be ready for discharge from an interventional cardiology perspective on  11/08/2023.   Recommend uninterrupted dual antiplatelet therapy with Aspirin 81mg  daily and Ticagrelor 90mg  twice daily for a minimum of 12 months (ACS-Class I recommendation). Conclusion Left heart cath inpatient right radial approach Indication non-STEMI Left ventriculogram Left ventricular function inferior hypoejection fraction about 45 to 50% Coronaries Left main large free of disease LAD large 95% mid diffuse 50% distally TIMI-3 flow Circumflex large minor irregularities RCA large 70% proximal to mid 100% distal TIMI I flow Right dominant system Faint collaterals right to right left to right Intervention Successful PCI and stent to proximal RCA 3.0 x 30 mm frontier Onyx to 21 atm Postdilated with a 3.5 x 20 mm  neo to 14 atm Lesion reduced from 70 down to 0% Successful PCI and stent distal RCA 2.0 x 22 mm frontier Onyx to 14 atm Lesion reduced from 100% down to 0% TIMI-3 flow restored from TIMI I Maintained on aspirin Brilinta for at least 12 months Tolerated procedure well No complications LAD to be staged tomorrow because of dye load today of over 200cc Case discussed with patient family and cardiology team  DG Chest Portable 1 View  Result Date: 11/02/2023 CLINICAL DATA:  Chest pain EXAM: PORTABLE CHEST 1 VIEW COMPARISON:  X-ray 07/04/2021 FINDINGS: No consolidation, pneumothorax or effusion. No edema. Normal cardiopericardial silhouette when adjusting for technique. Left shoulder arthroplasty. IMPRESSION: No acute cardiopulmonary disease. Electronically Signed   By: Karen Kays M.D.   On: 11/02/2023 17:23    Microbiology: Results for orders placed or performed during the hospital encounter of 07/04/21  Resp Panel by RT-PCR (Flu A&B, Covid) Nasopharyngeal Swab     Status: None   Collection Time: 07/05/21  1:09 AM   Specimen: Nasopharyngeal Swab; Nasopharyngeal(NP) swabs in vial  transport medium  Result Value Ref Range Status   SARS Coronavirus 2 by RT PCR NEGATIVE NEGATIVE Final    Comment: (NOTE) SARS-CoV-2 target nucleic acids are NOT DETECTED.  The SARS-CoV-2 RNA is generally detectable in upper respiratory specimens during the acute phase of infection. The lowest concentration of SARS-CoV-2 viral copies this assay can detect is 138 copies/mL. A negative result does not preclude SARS-Cov-2 infection and should not be used as the sole basis for treatment or other patient management decisions. A negative result may occur with  improper specimen collection/handling, submission of specimen other than nasopharyngeal swab, presence of viral mutation(s) within the areas targeted by this assay, and inadequate number of viral copies(<138 copies/mL). A negative result must be combined with clinical observations, patient history, and epidemiological information. The expected result is Negative.  Fact Sheet for Patients:  BloggerCourse.com  Fact Sheet for Healthcare Providers:  SeriousBroker.it  This test is no t yet approved or cleared by the [place] FDA and  has been authorized for detection and/or diagnosis of SARS-CoV-2 by FDA under an Emergency Use Authorization (EUA). This EUA will remain  in effect (meaning this test can be used) for the duration of the COVID-19 declaration under Section 564(b)(1) of the Act, 21 U.S.C.section 360bbb-3(b)(1), unless the authorization is terminated  or revoked sooner.       Influenza A by PCR NEGATIVE NEGATIVE Final   Influenza B by PCR NEGATIVE NEGATIVE Final    Comment: (NOTE) The Xpert Xpress SARS-CoV-2/FLU/RSV plus assay is intended as an aid in the diagnosis of influenza from Nasopharyngeal swab specimens and should not be used as a sole basis for treatment. Nasal washings and aspirates are unacceptable for Xpert Xpress SARS-CoV-2/FLU/RSV testing.  Fact  Sheet  for Patients: BloggerCourse.com  Fact Sheet for Healthcare Providers: SeriousBroker.it  This test is not yet approved or cleared by the [place] FDA and has been authorized for detection and/or diagnosis of SARS-CoV-2 by FDA under an Emergency Use Authorization (EUA). This EUA will remain in effect (meaning this test can be used) for the duration of the COVID-19 declaration under Section 564(b)(1) of the Act, 21 U.S.C. section 360bbb-3(b)(1), unless the authorization is terminated or revoked.  Performed at [facility], [address] [place], [place] [address]     Labs: CBC: Recent Labs  Lab 11/02/23 1430 11/03/23 0501 11/04/23 0515 11/05/23 0555 11/06/23 0350 11/07/23 0445 11/08/23 0535  WBC 6.2   < > 5.6 6.6 7.8 8.1 6.7  NEUTROABS 4.2  --   --   --   --   --   --   HGB 14.2   < > 14.0 15.1 15.5 14.4 14.2  HCT 40.1   < > 39.5 42.2 43.6 40.8 41.0  MCV 86.6   < > 86.2 87.0 85.7 88.1 89.1  PLT 184   < > 161 170 183 180 157   < > = values in this interval not displayed.   Basic Metabolic Panel: Recent Labs  Lab 11/03/23 0501 11/05/23 0555 11/06/23 0350 11/07/23 0445 11/08/23 0535  NA 136 134* 133* 134* 134*  K 3.6 3.5 3.5 3.5 3.6  CL 102 103 103 100 102  CO2 25 22 23 23 23   GLUCOSE 120* 124* 128* 132* 164*  BUN 27* 24* 25* 25* 19  CREATININE 1.02 1.01 1.10 0.91 0.93  CALCIUM 8.8* 9.7 10.0 9.3 8.9   Liver Function Tests: No results for input(s): "AST", "ALT", "ALKPHOS", "BILITOT", "PROT", "ALBUMIN" in the last 168 hours. CBG: Recent Labs  Lab 11/07/23 1159 11/07/23 1628 11/07/23 2137 11/08/23 0831 11/08/23 1145  GLUCAP 102* 88 138* 93 122*    Discharge time spent: greater than 30 minutes.  Signed: Marrion Coy, MD [facility] 11/08/2023

## 2023-11-08 NOTE — Progress Notes (Signed)
Patient discharging home, all DC paperwork reviewed and sent with pt. Reviewed care of R wrist-heart cath site. Patient leaving with all personal belongings with spouse via private vehicle

## 2023-11-08 NOTE — Progress Notes (Signed)
[facility] CLINIC CARDIOLOGY PROGRESS NOTE   Patient ID: Kevin Reid MRN: [id number] DOB/AGE: Dec 22, 1961 62 y.o.  Admit date: 11/02/2023 Referring Physician Dr Renae Gloss Primary Physician Kandyce Rud, MD Primary Cardiologist Dr Juliann Pares Reason for Consultation NSTEMI   HPI: Kevin Reid is a 62 y.o. male with a past medical history of hypertension and hyperlipidemia who presented to the ED on 11/02/2023 for chest pain. Patient noted to have elevated troponins with persistent chest pain. He has not seen cardiology in a few years now. Cardiology was consulted for further evaluation.   Interval History:  -Patient seen and examined this AM. Sitting upright in bed with wife present.  -Denies any CP or SOB.  -BP and HR controlled. Renal function stable.   Review of systems complete and found to be negative unless listed above    Vitals:   11/07/23 2028 11/08/23 0039 11/08/23 0428 11/08/23 0833  BP: (!) 139/95 132/87 (!) 144/76 (!) 140/92  Pulse: 61 (!) 54 (!) 57 (!) 56  Resp: 18 17 17 20   Temp: 98.7 F (37.1 C) 97.7 F (36.5 C) 97.8 F (36.6 C) 98.4 F (36.9 C)  TempSrc: Oral  Oral Oral  SpO2: 98% 95% 100% 100%  Weight:      Height:         Intake/Output Summary (Last 24 hours) at 11/08/2023 1050 Last data filed at 11/08/2023 0900 Gross per 24 hour  Intake 1957.5 ml  Output 2725 ml  Net -767.5 ml     PHYSICAL EXAM General: Well nourished male, in no acute distress resting comfortably in hospital bed with wife present at bedside. HEENT: Normocephalic and atraumatic. Neck: No JVD.  Lungs: Normal respiratory effort on CPAP. Clear bilaterally to auscultation. No wheezes, crackles, rhonchi.  Heart: HRRR. Normal S1 and S2 without gallops or murmurs. Radial & DP pulses 2+ bilaterally. Abdomen: Non-distended appearing.  Msk: Normal strength and tone for age. Extremities: No clubbing, cyanosis or edema.   Neuro: Alert and oriented X 3. Psych: Mood appropriate, affect  congruent.    LABS: Basic Metabolic Panel: Recent Labs    11/07/23 0445 11/08/23 0535  NA 134* 134*  K 3.5 3.6  CL 100 102  CO2 23 23  GLUCOSE 132* 164*  BUN 25* 19  CREATININE 0.91 0.93  CALCIUM 9.3 8.9   Liver Function Tests: No results for input(s): "AST", "ALT", "ALKPHOS", "BILITOT", "PROT", "ALBUMIN" in the last 72 hours. No results for input(s): "LIPASE", "AMYLASE" in the last 72 hours. CBC: Recent Labs    11/07/23 0445 11/08/23 0535  WBC 8.1 6.7  HGB 14.4 14.2  HCT 40.8 41.0  MCV 88.1 89.1  PLT 180 157   Cardiac Enzymes: No results for input(s): "CKTOTAL", "CKMB", "CKMBINDEX", "TROPONINIHS" in the last 72 hours.  BNP: No results for input(s): "BNP" in the last 72 hours. D-Dimer: No results for input(s): "DDIMER" in the last 72 hours. Hemoglobin A1C: No results for input(s): "HGBA1C" in the last 72 hours.  Fasting Lipid Panel: No results for input(s): "CHOL", "HDL", "LDLCALC", "TRIG", "CHOLHDL", "LDLDIRECT" in the last 72 hours.  Thyroid Function Tests: No results for input(s): "TSH", "T4TOTAL", "T3FREE", "THYROIDAB" in the last 72 hours.  Invalid input(s): "FREET3"  Anemia Panel: No results for input(s): "VITAMINB12", "FOLATE", "FERRITIN", "TIBC", "IRON", "RETICCTPCT" in the last 72 hours.  CARDIAC CATHETERIZATION  Result Date: 11/07/2023   Prox LAD lesion is 50% stenosed.   Mid LAD lesion is 95% stenosed.   Dist LAD lesion is 50% stenosed.   Non-stenotic  Prox RCA to Mid RCA lesion was previously treated.   Non-stenotic Dist RCA lesion was previously treated.   A stent was successfully placed.   Post intervention, there is a 0% residual stenosis.   CARDIAC CATHETERIZATION  Result Date: 11/06/2023   Prox RCA to Mid RCA lesion is 70% stenosed.   Dist RCA lesion is 100% stenosed.   Prox LAD lesion is 50% stenosed.   Mid LAD lesion is 95% stenosed.   Dist LAD lesion is 50% stenosed.   A stent was successfully placed.   A drug-eluting stent was  successfully placed using a STENT ONYX FRONTIER 2.0X22.   Post intervention, there is a 0% residual stenosis.  Proximal RCA   Post intervention, there is a 0% residual stenosis.  Distal RCA   There is mild left ventricular systolic dysfunction.   LV end diastolic pressure is mildly elevated.   The left ventricular ejection fraction is 45-50% by visual estimate.   In the absence of any other complications or medical issues, we expect the patient to be ready for discharge from an interventional cardiology perspective on 11/08/2023.   Recommend uninterrupted dual antiplatelet therapy with Aspirin 81mg  daily and Ticagrelor 90mg  twice daily for a minimum of 12 months (ACS-Class I recommendation). Conclusion Left heart cath inpatient right radial approach Indication non-STEMI Left ventriculogram Left ventricular function inferior hypoejection fraction about 45 to 50% Coronaries Left main large free of disease LAD large 95% mid diffuse 50% distally TIMI-3 flow Circumflex large minor irregularities RCA large 70% proximal to mid 100% distal TIMI I flow Right dominant system Faint collaterals right to right left to right Intervention Successful PCI and stent to proximal RCA 3.0 x 30 mm frontier Onyx to 21 atm Postdilated with a 3.5 x 20 mm [place] neo to 14 atm Lesion reduced from 70 down to 0% Successful PCI and stent distal RCA 2.0 x 22 mm frontier Onyx to 14 atm Lesion reduced from 100% down to 0% TIMI-3 flow restored from TIMI I Maintained on aspirin Brilinta for at least 12 months Tolerated procedure well No complications LAD to be staged tomorrow because of dye load today of over 200cc Case discussed with patient family and cardiology team    ECHO as above  TELEMETRY reviewed by me 11/08/23: sinus bradycardia rate 50s  EKG reviewed by me 11/08/23: Sinus bradycardia with left axis deviation, voltage criteria for LVH. Cannot rule out Anterior infarct , age undetermined. ST & T wave abnormality, consider lateral  ischemia  DATA reviewed by me 11/08/23: last 24h vitals tele labs imaging I/O hospitalist progress notes   Principal Problem:   NSTEMI (non-ST elevated myocardial infarction) [facility]) Active Problems:   Type 2 diabetes mellitus with hyperlipidemia (HCC)   Hyperlipidemia, unspecified   Hypertension   Sleep apnea   Obesity (BMI 30-39.9)   Hyponatremia   Hypokalemia    ASSESSMENT AND PLAN: Kevin Reid is a 62 y.o. male with a past medical history of hypertension and hyperlipidemia who presented to the ED on 11/02/2023 for chest pain. Patient noted to have elevated troponins with persistent chest pain. He has not seen cardiology in a few years now. Cardiology was consulted for further evaluation.   # NSTEMI # Hypertension # Hyperlipidemia Patient presented with chest pain with radiation to arms/shoulders. Troponins mildly elevated and flat, trended 144 > 168 > 181. Placed on heparin in the ED. S/p DES x2 to RCA 11/12. S/p DES to LAD on 11/13. -PRN nitroglycerin for persistent chest pain. -  Continue atorvastatin 80 mg daily and fenofibrate.  -Plan for DAPT x 12 months with aspirin 81 mg daily and Brilinta 90 mg twice daily. -Continue amlodipine 10 mg daily, bisoprolol 2.5 mg daily, irbesartan 300 mg daily.  # Obstructive sleep apnea Patient with hx of OSA on CPAP.  -Continue strict compliance with CPAP.  Ok for discharge today from a cardiac perspective. Will arrange for follow up in clinic with Dr. Juliann Pares in 1-2 weeks.    This patient's plan of care was discussed and created with Dr. Corky Sing and he is in agreement.    Signed: Gale Journey, PA-C 11/08/2023, 10:50 AM [facility] Cardiology

## 2023-11-08 NOTE — Plan of Care (Signed)
  Problem: Education: Goal: Ability to describe self-care measures that may prevent or decrease complications (Diabetes Survival Skills Education) will improve Outcome: Progressing   Problem: Coping: Goal: Ability to adjust to condition or change in health will improve Outcome: Progressing   Problem: Activity: Goal: Ability to tolerate increased activity will improve Outcome: Progressing

## 2023-11-09 ENCOUNTER — Encounter: Payer: Self-pay | Admitting: Internal Medicine

## 2023-11-13 DIAGNOSIS — I214 Non-ST elevation (NSTEMI) myocardial infarction: Secondary | ICD-10-CM | POA: Diagnosis not present

## 2023-11-13 DIAGNOSIS — G4733 Obstructive sleep apnea (adult) (pediatric): Secondary | ICD-10-CM | POA: Diagnosis not present

## 2023-11-13 DIAGNOSIS — I152 Hypertension secondary to endocrine disorders: Secondary | ICD-10-CM | POA: Diagnosis not present

## 2023-11-13 DIAGNOSIS — E1159 Type 2 diabetes mellitus with other circulatory complications: Secondary | ICD-10-CM | POA: Diagnosis not present

## 2023-11-13 DIAGNOSIS — I10 Essential (primary) hypertension: Secondary | ICD-10-CM | POA: Diagnosis not present

## 2023-11-13 DIAGNOSIS — Z125 Encounter for screening for malignant neoplasm of prostate: Secondary | ICD-10-CM | POA: Diagnosis not present

## 2023-11-13 DIAGNOSIS — E7800 Pure hypercholesterolemia, unspecified: Secondary | ICD-10-CM | POA: Diagnosis not present

## 2023-11-13 DIAGNOSIS — Z79899 Other long term (current) drug therapy: Secondary | ICD-10-CM | POA: Diagnosis not present

## 2023-11-15 ENCOUNTER — Encounter: Payer: Self-pay | Admitting: Internal Medicine

## 2023-11-15 DIAGNOSIS — E6609 Other obesity due to excess calories: Secondary | ICD-10-CM | POA: Diagnosis not present

## 2023-11-15 DIAGNOSIS — E119 Type 2 diabetes mellitus without complications: Secondary | ICD-10-CM | POA: Diagnosis not present

## 2023-11-15 DIAGNOSIS — I10 Essential (primary) hypertension: Secondary | ICD-10-CM | POA: Diagnosis not present

## 2023-11-15 DIAGNOSIS — J449 Chronic obstructive pulmonary disease, unspecified: Secondary | ICD-10-CM | POA: Diagnosis not present

## 2023-11-15 DIAGNOSIS — I252 Old myocardial infarction: Secondary | ICD-10-CM | POA: Diagnosis not present

## 2023-11-15 DIAGNOSIS — F411 Generalized anxiety disorder: Secondary | ICD-10-CM | POA: Diagnosis not present

## 2023-11-15 DIAGNOSIS — R600 Localized edema: Secondary | ICD-10-CM | POA: Diagnosis not present

## 2023-11-15 DIAGNOSIS — G4730 Sleep apnea, unspecified: Secondary | ICD-10-CM | POA: Diagnosis not present

## 2023-11-15 DIAGNOSIS — Z955 Presence of coronary angioplasty implant and graft: Secondary | ICD-10-CM | POA: Diagnosis not present

## 2023-11-15 DIAGNOSIS — R0789 Other chest pain: Secondary | ICD-10-CM | POA: Diagnosis not present

## 2023-11-15 DIAGNOSIS — E782 Mixed hyperlipidemia: Secondary | ICD-10-CM | POA: Diagnosis not present

## 2023-11-15 LAB — CARDIAC CATHETERIZATION: Cath EF Quantitative: 45 %

## 2023-11-19 ENCOUNTER — Ambulatory Visit: Payer: Self-pay | Admitting: Physician Assistant

## 2023-11-19 ENCOUNTER — Encounter: Payer: Self-pay | Admitting: Physician Assistant

## 2023-11-19 VITALS — BP 140/88 | HR 55 | Temp 98.0°F | Resp 16 | Ht 70.0 in | Wt 210.0 lb

## 2023-11-19 DIAGNOSIS — Z7689 Persons encountering health services in other specified circumstances: Secondary | ICD-10-CM

## 2023-11-19 NOTE — Progress Notes (Signed)
   Subjective: Return to work evaluation    Patient ID: Glendel Dondiego, male    DOB: May 17, 1961, 62 y.o.   MRN: [id number]  HPI Patient request return to work status.  Patient presents with released from [facility] by cardiologist.  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   Review of Systems NSTEMI, hyperlipidemia, sleep apnea, and type 2 diabetes    Objective:   Physical Exam BP 140/88  Pulse 55  Resp 16  Temp 98 F (36.7 C)  SpO2 99 %  Weight 210 lb (95.3 kg)  Height 5\' 10"  (1.778 m)   BMI 30.13 kg/m2  BSA 2.17 m2  No acute distress. HEENT. Lungs clear to auscultation. Neck is supple for lymphadenopathy or bruits. Heart regular rate and rhythm       Assessment & Plan: Return to work evaluation   Patient return back to a trial of full duties.

## 2023-11-19 NOTE — Progress Notes (Signed)
Stated ready to return to work after out of work post MI and had heart cath with 3 stents he stated.  No peripheral edema noted.  Stated he is doing some exercise and eating healthy choices.  No complaints of chest pain or dyspnea.  Works for [employer].  Monitors BP at home and stated he watches it go up and down and takes meds as prescribed.

## 2023-11-27 ENCOUNTER — Encounter: Payer: 59 | Attending: Internal Medicine | Admitting: *Deleted

## 2023-11-27 DIAGNOSIS — Z955 Presence of coronary angioplasty implant and graft: Secondary | ICD-10-CM | POA: Insufficient documentation

## 2023-11-27 DIAGNOSIS — I214 Non-ST elevation (NSTEMI) myocardial infarction: Secondary | ICD-10-CM | POA: Insufficient documentation

## 2023-11-27 NOTE — Progress Notes (Signed)
Initial phone call completed. Diagnosis can be found in [facility] 11/8. EP Orientation scheduled for Wednesday 12/4 at 10:30.

## 2023-11-28 ENCOUNTER — Encounter: Payer: 59 | Admitting: *Deleted

## 2023-11-28 VITALS — Ht 71.0 in | Wt 220.3 lb

## 2023-11-28 DIAGNOSIS — Z955 Presence of coronary angioplasty implant and graft: Secondary | ICD-10-CM | POA: Diagnosis not present

## 2023-11-28 DIAGNOSIS — I214 Non-ST elevation (NSTEMI) myocardial infarction: Secondary | ICD-10-CM

## 2023-11-28 NOTE — Patient Instructions (Signed)
Patient Instructions  Patient Details  Name: Kevin Reid MRN: [id number] Date of Birth: [birth year] Referring Provider:  Alwyn Pea, MD  Below are your personal goals for exercise, nutrition, and risk factors. Our goal is to help you stay on track towards obtaining and maintaining these goals. We will be discussing your progress on these goals with you throughout the program.  Initial Exercise Prescription:  Initial Exercise Prescription - 11/28/23 1200       Date of Initial Exercise RX and Referring Provider   Date 11/28/23    Referring Provider Dr. Dorothyann Peng      Oxygen   Maintain Oxygen Saturation 88% or higher      Treadmill   MPH 2.6    Grade 1    Minutes 15    METs 3.35      Recumbant Bike   Level 3    RPM 50    Watts 20    Minutes 15    METs 3.17      NuStep   Level 3   T4 and T6   SPM 80    Minutes 15    METs 3.17      Elliptical   Level 1    Speed 3    Minutes 15    METs 3.17      Biostep-RELP   Level 3    SPM 50    Minutes 15    METs 3.17      Prescription Details   Frequency (times per week) 3    Duration Progress to 30 minutes of continuous aerobic without signs/symptoms of physical distress      Intensity   THRR 40-80% of Max Heartrate 94-138    Ratings of Perceived Exertion 11-13    Perceived Dyspnea 0-4      Progression   Progression Continue to progress workloads to maintain intensity without signs/symptoms of physical distress.      Resistance Training   Training Prescription Yes    Weight 4    Reps 10-15             Exercise Goals: Frequency: Be able to perform aerobic exercise two to three times per week in program working toward 2-5 days per week of home exercise.  Intensity: Work with a perceived exertion of 11 (fairly light) - 15 (hard) while following your exercise prescription.  We will make changes to your prescription with you as you progress through the program.   Duration: Be able to do 30 to 45  minutes of continuous aerobic exercise in addition to a 5 minute warm-up and a 5 minute cool-down routine.   Nutrition Goals: Your personal nutrition goals will be established when you do your nutrition analysis with the dietician.  The following are general nutrition guidelines to follow: Cholesterol < 200mg /day Sodium < 1500mg /day Fiber: Men over 50 yrs - 30 grams per day  Personal Goals:  Personal Goals and Risk Factors at Admission - 11/28/23 1238       Core Components/Risk Factors/Patient Goals on Admission    Weight Management Yes    Intervention Weight Management: Develop a combined nutrition and exercise program designed to reach desired caloric intake, while maintaining appropriate intake of nutrient and fiber, sodium and fats, and appropriate energy expenditure required for the weight goal.;Weight Management: Provide education and appropriate resources to help participant work on and attain dietary goals.;Weight Management/Obesity: Establish reasonable short term and long term weight goals.    Admit Weight 220 lb  4.8 oz (99.9 kg)    Goal Weight: Short Term 210 lb (95.3 kg)    Goal Weight: Long Term 190 lb (86.2 kg)    Expected Outcomes Short Term: Continue to assess and modify interventions until short term weight is achieved;Long Term: Adherence to nutrition and physical activity/exercise program aimed toward attainment of established weight goal;Weight Loss: Understanding of general recommendations for a balanced deficit meal plan, which promotes 1-2 lb weight loss per week and includes a negative energy balance of [phone] kcal/d;Understanding recommendations for meals to include 15-35% energy as protein, 25-35% energy from fat, 35-60% energy from carbohydrates, less than 200mg  of dietary cholesterol, 20-35 gm of total fiber daily;Understanding of distribution of calorie intake throughout the day with the consumption of 4-5 meals/snacks    Diabetes Yes    Intervention Provide  education about signs/symptoms and action to take for hypo/hyperglycemia.;Provide education about proper nutrition, including hydration, and aerobic/resistive exercise prescription along with prescribed medications to achieve blood glucose in normal ranges: Fasting glucose 65-99 mg/dL    Expected Outcomes Short Term: Participant verbalizes understanding of the signs/symptoms and immediate care of hyper/hypoglycemia, proper foot care and importance of medication, aerobic/resistive exercise and nutrition plan for blood glucose control.;Long Term: Attainment of HbA1C < 7%.    Hypertension Yes    Intervention Provide education on lifestyle modifcations including regular physical activity/exercise, weight management, moderate sodium restriction and increased consumption of fresh fruit, vegetables, and low fat dairy, alcohol moderation, and smoking cessation.;Monitor prescription use compliance.    Expected Outcomes Short Term: Continued assessment and intervention until BP is < 140/63mm HG in hypertensive participants. < 130/59mm HG in hypertensive participants with diabetes, heart failure or chronic kidney disease.;Long Term: Maintenance of blood pressure at goal levels.    Lipids Yes    Intervention Provide education and support for participant on nutrition & aerobic/resistive exercise along with prescribed medications to achieve LDL 70mg , HDL >40mg .    Expected Outcomes Short Term: Participant states understanding of desired cholesterol values and is compliant with medications prescribed. Participant is following exercise prescription and nutrition guidelines.;Long Term: Cholesterol controlled with medications as prescribed, with individualized exercise RX and with personalized nutrition plan. Value goals: LDL < 70mg , HDL > 40 mg.             Tobacco Use Initial Evaluation: Social History   Tobacco Use  Smoking Status Former   Current packs/day: 0.00   Average packs/day: 1 pack/day for 8.0 years  (8.0 ttl pk-yrs)   Types: Cigarettes   Start date: 04/24/2010   Quit date: 04/24/2018   Years since quitting: 5.6  Smokeless Tobacco Former   Types: Chew   Quit date: 11/19/2013    Exercise Goals and Review:  Exercise Goals     Row Name 11/28/23 1236             Exercise Goals   Increase Physical Activity Yes       Intervention Provide advice, education, support and counseling about physical activity/exercise needs.;Develop an individualized exercise prescription for aerobic and resistive training based on initial evaluation findings, risk stratification, comorbidities and participant's personal goals.       Expected Outcomes Short Term: Attend rehab on a regular basis to increase amount of physical activity.;Long Term: Add in home exercise to make exercise part of routine and to increase amount of physical activity.;Long Term: Exercising regularly at least 3-5 days a week.       Increase Strength and Stamina Yes  Intervention Provide advice, education, support and counseling about physical activity/exercise needs.;Develop an individualized exercise prescription for aerobic and resistive training based on initial evaluation findings, risk stratification, comorbidities and participant's personal goals.       Expected Outcomes Short Term: Increase workloads from initial exercise prescription for resistance, speed, and METs.;Short Term: Perform resistance training exercises routinely during rehab and add in resistance training at home;Long Term: Improve cardiorespiratory fitness, muscular endurance and strength as measured by increased METs and functional capacity ( )       Able to understand and use rate of perceived exertion (RPE) scale Yes       Intervention Provide education and explanation on how to use RPE scale       Expected Outcomes Short Term: Able to use RPE daily in rehab to express subjective intensity level;Long Term:  Able to use RPE to guide intensity level when  exercising independently       Able to understand and use Dyspnea scale Yes       Intervention Provide education and explanation on how to use Dyspnea scale       Expected Outcomes Short Term: Able to use Dyspnea scale daily in rehab to express subjective sense of shortness of breath during exertion;Long Term: Able to use Dyspnea scale to guide intensity level when exercising independently       Knowledge and understanding of Target Heart Rate Range (THRR) Yes       Intervention Provide education and explanation of THRR including how the numbers were predicted and where they are located for reference       Expected Outcomes Short Term: Able to state/look up THRR;Long Term: Able to use THRR to govern intensity when exercising independently;Short Term: Able to use daily as guideline for intensity in rehab       Able to check pulse independently Yes       Intervention Provide education and demonstration on how to check pulse in carotid and radial arteries.;Review the importance of being able to check your own pulse for safety during independent exercise       Expected Outcomes Short Term: Able to explain why pulse checking is important during independent exercise;Long Term: Able to check pulse independently and accurately       Understanding of Exercise Prescription Yes       Intervention Provide education, explanation, and written materials on patient's individual exercise prescription       Expected Outcomes Short Term: Able to explain program exercise prescription;Long Term: Able to explain home exercise prescription to exercise independently                Copy of goals given to participant.

## 2023-11-28 NOTE — Progress Notes (Signed)
Assessment start time: 11:58 AM   Digestive issues/concerns: no known food allergies  24-hours Recall: B: [place] grilled chicken egg and cheese on english muffin, grits, diet dr pepper, coffee L: grilled chicken, brussel sprouts, ww toast, unsweet toast D: grilled meats (sometimes pork or red meat, but mostly chicken)  Beverages diet soda, flavored water Alcohol rarely  Caffeine coffee  Education r/t nutrition plan Patient drinking mostly diet dr pepper. Switched from regularly sodas to diet, commended him on the change and encouraged him to continue to choose non sugary beverages. His A1C appears to have improved drastically from ~7% to 5.4%. He has never counted carbs, but tried to limit starchy foods. Spoke with him about quality of carbs, encouraging more whole grains and natural foods rather than refined carb choices. He likes whole grain breads and brown rice. Also discussed carb quantity and target of ~30-60g per meal. Reviewed Mediterranean diet handout, educated on types of fats, sources, and how to read label. Went over several of his favorite foods facts label and recommended changes when needed. Set goal to read labels and watch sodium more carefully. Built out several meals and snacks with foods he likes and will eat focusing on less sodium and saturated fat with more balance between complex carbs and healthy protein or fat   Goal 1: Read labels and reduce sodium intake to below 2300mg . Ideally 1500mg  per day.  Goal 2: Reduce saturated fat, less than 12g per day. Replace bad fats for more heart healthy fats.  Goal 3: Eat 15-30gProtein and 30-60gCarbs at each meal.  End time 12:44 PM

## 2023-11-28 NOTE — Progress Notes (Signed)
Cardiac Individual Treatment Plan  Patient Details  Name: Kevin Reid MRN: [id number] Date of Birth: [birth year] Referring Provider:   Flowsheet Row Cardiac Rehab from 11/28/2023 in [facility] Cardiac and Pulmonary Rehab  Referring Provider Dr. Dorothyann Peng       Initial Encounter Date:  Flowsheet Row Cardiac Rehab from 11/28/2023 in [facility] Cardiac and Pulmonary Rehab  Date 11/28/23       Visit Diagnosis: NSTEMI (non-ST elevated myocardial infarction) [facility])  Patient's Home Medications on Admission:  Current Outpatient Medications:    amLODipine (NORVASC) 10 MG tablet, Take 1 tablet (10 mg total) by mouth daily., Disp: 30 tablet, Rfl: 0   aspirin 81 MG chewable tablet, Chew 1 tablet (81 mg total) by mouth daily., Disp: 30 tablet, Rfl: 0   atorvastatin (LIPITOR) 80 MG tablet, Take 1 tablet (80 mg total) by mouth daily., Disp: 30 tablet, Rfl: 0   bisoprolol (ZEBETA) 5 MG tablet, Take 0.5 tablets (2.5 mg total) by mouth daily., Disp: 30 tablet, Rfl: 0   Blood Glucose Monitoring Suppl (BLOOD GLUCOSE MONITOR SYSTEM) w/Device KIT, Use to test blood sugar once daily, Disp: 1 kit, Rfl: 0   fenofibrate (TRICOR) 145 MG tablet, Take 1 tablet (145 mg total) by mouth daily., Disp: 90 tablet, Rfl: 3   glucose blood (CONTOUR NEXT TEST) test strip, Check sugar once daily. DX E11.9, Disp: 100 each, Rfl: 12   hydrochlorothiazide (HYDRODIURIL) 25 MG tablet, Take 25 mg by mouth daily., Disp: , Rfl:    Lancets MISC, 1 each by Does not apply route daily as needed., Disp: 200 each, Rfl: 1   levocetirizine (XYZAL) 5 MG tablet, Take 5 mg by mouth every evening. (Patient not taking: Reported on 11/19/2023), Disp: , Rfl:    metFORMIN (GLUCOPHAGE) 1000 MG tablet, Take 1 tablet (1,000 mg total) by mouth 2 (two) times daily., Disp: 180 tablet, Rfl: 1   MOUNJARO 7.5 MG/0.5ML Pen, Inject 7.5 mg into the skin once a week. Sunday, Disp: , Rfl:    NON FORMULARY, CPAP MACHINE USING EVERY NIGHT setting at 10, Disp: , Rfl:     omeprazole (PRILOSEC) 40 MG capsule, Take by mouth. (Patient not taking: Reported on 11/19/2023), Disp: , Rfl:    PARoxetine (PAXIL) 20 MG tablet, TAKE 3 TABLETS BY MOUTH ONCE DAILY (Patient taking differently: Take 60 mg by mouth daily.), Disp: 90 tablet, Rfl: 0   telmisartan (MICARDIS) 80 MG tablet, Take 80 mg by mouth daily., Disp: , Rfl:    ticagrelor (BRILINTA) 90 MG TABS tablet, Take 1 tablet (90 mg total) by mouth 2 (two) times daily., Disp: 60 tablet, Rfl: 0  Past Medical History: Past Medical History:  Diagnosis Date   Allergy    Depression    Diabetes mellitus without complication (HCC)    GERD (gastroesophageal reflux disease)    Heart murmur    Hyperlipidemia    Hypertension    Joint pain    Sleep apnea     Tobacco Use: Social History   Tobacco Use  Smoking Status Former   Current packs/day: 0.00   Average packs/day: 1 pack/day for 8.0 years (8.0 ttl pk-yrs)   Types: Cigarettes   Start date: 04/24/2010   Quit date: 04/24/2018   Years since quitting: 5.6  Smokeless Tobacco Former   Types: Chew   Quit date: 11/19/2013    Labs: Review Flowsheet  More data exists      Latest Ref Rng & Units 01/26/2020 02/17/2020 12/29/2020 11/02/2023 11/03/2023  Labs for ITP Cardiac  and Pulmonary Rehab  Cholestrol 0 - 200 mg/dL 725  366  - - 440   LDL (calc) 0 - 99 mg/dL - 83  - - 74   Direct LDL mg/dL 34.7  - - - -  HDL-C >42 mg/dL 59.56  29  - - 31   Trlycerides <150 mg/dL 387.5 Triglyceride is over 400; calculations on Lipids are invalid.  367  209  - 176   Hemoglobin A1c 4.8 - 5.6 % - 7.0  7.2  5.4  -    Details             Exercise Target Goals: Exercise Program Goal: Individual exercise prescription set using results from initial 6 min walk test and THRR while considering  patient's activity barriers and safety.   Exercise Prescription Goal: Initial exercise prescription builds to 30-45 minutes a day of aerobic activity, 2-3 days per week.  Home exercise guidelines  will be given to patient during program as part of exercise prescription that the participant will acknowledge.   Education: Aerobic Exercise: - Group verbal and visual presentation on the components of exercise prescription. Introduces F.I.T.T principle from ACSM for exercise prescriptions.  Reviews F.I.T.T. principles of aerobic exercise including progression. Written material given at graduation.   Education: Resistance Exercise: - Group verbal and visual presentation on the components of exercise prescription. Introduces F.I.T.T principle from ACSM for exercise prescriptions  Reviews F.I.T.T. principles of resistance exercise including progression. Written material given at graduation.    Education: Exercise & Equipment Safety: - Individual verbal instruction and demonstration of equipment use and safety with use of the equipment. Flowsheet Row Cardiac Rehab from 11/28/2023 in [facility] Cardiac and Pulmonary Rehab  Date 11/28/23  Educator [facility]  Instruction Review Code 1- Verbalizes Understanding       Education: Exercise Physiology & General Exercise Guidelines: - Group verbal and written instruction with models to review the exercise physiology of the cardiovascular system and associated critical values. Provides general exercise guidelines with specific guidelines to those with heart or lung disease.    Education: Flexibility, Balance, Mind/Body Relaxation: - Group verbal and visual presentation with interactive activity on the components of exercise prescription. Introduces F.I.T.T principle from ACSM for exercise prescriptions. Reviews F.I.T.T. principles of flexibility and balance exercise training including progression. Also discusses the mind body connection.  Reviews various relaxation techniques to help reduce and manage stress (i.e. Deep breathing, progressive muscle relaxation, and visualization). Balance handout provided to take home. Written material given at  graduation.   Activity Barriers & Risk Stratification:  Activity Barriers & Cardiac Risk Stratification - 11/28/23 1232       Activity Barriers & Cardiac Risk Stratification   Activity Barriers Joint Problems    Cardiac Risk Stratification High             6 Minute Walk:  6 Minute Walk     Row Name 11/28/23 1231         6 Minute Walk   Phase Initial     Distance 1350 feet     Walk Time 6 minutes     MPH 2.56     METS 3.17     RPE 9     Perceived Dyspnea  0     VO2 Peak 11.1     Symptoms No     Resting HR 52 bpm     Resting BP 138/82     Resting Oxygen Saturation  97 %     Exercise Oxygen  Saturation  during 6 min walk 97 %     Max Ex. HR 71 bpm     Max Ex. BP 154/82     2 Minute Post BP 140/80              Oxygen Initial Assessment:   Oxygen Re-Evaluation:   Oxygen Discharge (Final Oxygen Re-Evaluation):   Initial Exercise Prescription:  Initial Exercise Prescription - 11/28/23 1200       Date of Initial Exercise RX and Referring Provider   Date 11/28/23    Referring Provider Dr. Dorothyann Peng      Oxygen   Maintain Oxygen Saturation 88% or higher      Treadmill   MPH 2.6    Grade 1    Minutes 15    METs 3.35      Recumbant Bike   Level 3    RPM 50    Watts 20    Minutes 15    METs 3.17      NuStep   Level 3   T4 and T6   SPM 80    Minutes 15    METs 3.17      Elliptical   Level 1    Speed 3    Minutes 15    METs 3.17      Biostep-RELP   Level 3    SPM 50    Minutes 15    METs 3.17      Prescription Details   Frequency (times per week) 3    Duration Progress to 30 minutes of continuous aerobic without signs/symptoms of physical distress      Intensity   THRR 40-80% of Max Heartrate 94-138    Ratings of Perceived Exertion 11-13    Perceived Dyspnea 0-4      Progression   Progression Continue to progress workloads to maintain intensity without signs/symptoms of physical distress.      Resistance Training    Training Prescription Yes    Weight 4    Reps 10-15             Perform Capillary Blood Glucose checks as needed.  Exercise Prescription Changes:   Exercise Prescription Changes     Row Name 11/28/23 1200             Response to Exercise   Blood Pressure (Admit) 138/82       Blood Pressure (Exercise) 154/82       Blood Pressure (Exit) 140/80       Heart Rate (Admit) 52 bpm       Heart Rate (Exercise) 71 bpm       Heart Rate (Exit) 53 bpm       Oxygen Saturation (Admit) 97 %       Oxygen Saturation (Exercise) 97 %       Oxygen Saturation (Exit) 98 %       Rating of Perceived Exertion (Exercise) 9       Perceived Dyspnea (Exercise) 0       Symptoms none       Comments 6 MWT results                Exercise Comments:   Exercise Goals and Review:   Exercise Goals     Row Name 11/28/23 1236             Exercise Goals   Increase Physical Activity Yes       Intervention Provide advice, education, support and  counseling about physical activity/exercise needs.;Develop an individualized exercise prescription for aerobic and resistive training based on initial evaluation findings, risk stratification, comorbidities and participant's personal goals.       Expected Outcomes Short Term: Attend rehab on a regular basis to increase amount of physical activity.;Long Term: Add in home exercise to make exercise part of routine and to increase amount of physical activity.;Long Term: Exercising regularly at least 3-5 days a week.       Increase Strength and Stamina Yes       Intervention Provide advice, education, support and counseling about physical activity/exercise needs.;Develop an individualized exercise prescription for aerobic and resistive training based on initial evaluation findings, risk stratification, comorbidities and participant's personal goals.       Expected Outcomes Short Term: Increase workloads from initial exercise prescription for resistance, speed, and  METs.;Short Term: Perform resistance training exercises routinely during rehab and add in resistance training at home;Long Term: Improve cardiorespiratory fitness, muscular endurance and strength as measured by increased METs and functional capacity ( )       Able to understand and use rate of perceived exertion (RPE) scale Yes       Intervention Provide education and explanation on how to use RPE scale       Expected Outcomes Short Term: Able to use RPE daily in rehab to express subjective intensity level;Long Term:  Able to use RPE to guide intensity level when exercising independently       Able to understand and use Dyspnea scale Yes       Intervention Provide education and explanation on how to use Dyspnea scale       Expected Outcomes Short Term: Able to use Dyspnea scale daily in rehab to express subjective sense of shortness of breath during exertion;Long Term: Able to use Dyspnea scale to guide intensity level when exercising independently       Knowledge and understanding of Target Heart Rate Range (THRR) Yes       Intervention Provide education and explanation of THRR including how the numbers were predicted and where they are located for reference       Expected Outcomes Short Term: Able to state/look up THRR;Long Term: Able to use THRR to govern intensity when exercising independently;Short Term: Able to use daily as guideline for intensity in rehab       Able to check pulse independently Yes       Intervention Provide education and demonstration on how to check pulse in carotid and radial arteries.;Review the importance of being able to check your own pulse for safety during independent exercise       Expected Outcomes Short Term: Able to explain why pulse checking is important during independent exercise;Long Term: Able to check pulse independently and accurately       Understanding of Exercise Prescription Yes       Intervention Provide education, explanation, and written materials  on patient's individual exercise prescription       Expected Outcomes Short Term: Able to explain program exercise prescription;Long Term: Able to explain home exercise prescription to exercise independently                Exercise Goals Re-Evaluation :   Discharge Exercise Prescription (Final Exercise Prescription Changes):  Exercise Prescription Changes - 11/28/23 1200       Response to Exercise   Blood Pressure (Admit) 138/82    Blood Pressure (Exercise) 154/82    Blood Pressure (Exit) 140/80    Heart  Rate (Admit) 52 bpm    Heart Rate (Exercise) 71 bpm    Heart Rate (Exit) 53 bpm    Oxygen Saturation (Admit) 97 %    Oxygen Saturation (Exercise) 97 %    Oxygen Saturation (Exit) 98 %    Rating of Perceived Exertion (Exercise) 9    Perceived Dyspnea (Exercise) 0    Symptoms none    Comments 6 MWT results             Nutrition:  Target Goals: Understanding of nutrition guidelines, daily intake of sodium 1500mg , cholesterol 200mg , calories 30% from fat and 7% or less from saturated fats, daily to have 5 or more servings of fruits and vegetables.  Education: All About Nutrition: -Group instruction provided by verbal, written material, interactive activities, discussions, models, and posters to present general guidelines for heart healthy nutrition including fat, fiber, MyPlate, the role of sodium in heart healthy nutrition, utilization of the nutrition label, and utilization of this knowledge for meal planning. Follow up email sent as well. Written material given at graduation.   Biometrics:  Pre Biometrics - 11/28/23 1237       Pre Biometrics   Height 5\' 11"  (1.803 m)    Weight 220 lb 4.8 oz (99.9 kg)    Waist Circumference 44 inches    Hip Circumference 42 inches    Waist to Hip Ratio 1.05 %    BMI (Calculated) 30.74    Single Leg Stand 30 seconds              Nutrition Therapy Plan and Nutrition Goals:   Nutrition Assessments:  MEDIFICTS Score  Key: >=70 Need to make dietary changes  40-70 Heart Healthy Diet <= 40 Therapeutic Level Cholesterol Diet  Flowsheet Row Cardiac Rehab from 11/28/2023 in [facility] Cardiac and Pulmonary Rehab  Picture Your Plate Total Score on Admission 65      Picture Your Plate Scores: <16 Unhealthy dietary pattern with much room for improvement. 41-50 Dietary pattern unlikely to meet recommendations for good health and room for improvement. 51-60 More healthful dietary pattern, with some room for improvement.  >60 Healthy dietary pattern, although there may be some specific behaviors that could be improved.    Nutrition Goals Re-Evaluation:   Nutrition Goals Discharge (Final Nutrition Goals Re-Evaluation):   Psychosocial: Target Goals: Acknowledge presence or absence of significant depression and/or stress, maximize coping skills, provide positive support system. Participant is able to verbalize types and ability to use techniques and skills needed for reducing stress and depression.   Education: Stress, Anxiety, and Depression - Group verbal and visual presentation to define topics covered.  Reviews how body is impacted by stress, anxiety, and depression.  Also discusses healthy ways to reduce stress and to treat/manage anxiety and depression.  Written material given at graduation. Flowsheet Row Cardiac Rehab from 11/28/2023 in [facility] Cardiac and Pulmonary Rehab  Education need identified 11/28/23       Education: Sleep Hygiene -Provides group verbal and written instruction about how sleep can affect your health.  Define sleep hygiene, discuss sleep cycles and impact of sleep habits. Review good sleep hygiene tips.    Initial Review & Psychosocial Screening:  Initial Psych Review & Screening - 11/27/23 1410       Initial Review   Current issues with History of Depression      Family Dynamics   Good Support System? Yes   wife, family     Barriers   Psychosocial barriers to participate in  program There are no identifiable barriers or psychosocial needs.;The patient should benefit from training in stress management and relaxation.      Screening Interventions   Interventions Encouraged to exercise;Provide feedback about the scores to participant;To provide support and resources with identified psychosocial needs    Expected Outcomes Short Term goal: Utilizing psychosocial counselor, staff and physician to assist with identification of specific Stressors or current issues interfering with healing process. Setting desired goal for each stressor or current issue identified.;Long Term Goal: Stressors or current issues are controlled or eliminated.;Short Term goal: Identification and review with participant of any Quality of Life or Depression concerns found by scoring the questionnaire.;Long Term goal: The participant improves quality of Life and PHQ9 Scores as seen by post scores and/or verbalization of changes             Quality of Life Scores:   Quality of Life - 11/28/23 1237       Quality of Life   Select Quality of Life      Quality of Life Scores   Health/Function Pre 25.29 %    Socioeconomic Pre 26.5 %    Psych/Spiritual Pre 28.29 %    Family Pre 28 %    GLOBAL Pre 26.5 %            Scores of 19 and below usually indicate a poorer quality of life in these areas.  A difference of  2-3 points is a clinically meaningful difference.  A difference of 2-3 points in the total score of the Quality of Life Index has been associated with significant improvement in overall quality of life, self-image, physical symptoms, and general health in studies assessing change in quality of life.  PHQ-9: Review Flowsheet       11/28/2023 10/18/2020 02/17/2020 11/19/2018  Depression screen PHQ 2/9  Decreased Interest 1 0 1 0 0  Down, Depressed, Hopeless 0 - 0 1 1  PHQ - 2 Score 1 0 1 1 1   Altered sleeping 1 - 0 3  Tired, decreased energy 2 - 2 1  Change in appetite 1 - 0 0   Feeling bad or failure about yourself  0 - 0 0  Trouble concentrating 1 - 0 1  Moving slowly or fidgety/restless 0 - 0 0  Suicidal thoughts 0 - 0 0  PHQ-9 Score 6 - 3 6  Difficult doing work/chores Not difficult at all - Not difficult at all Not difficult at all    Details       Multiple values from one day are sorted in reverse-chronological order        Interpretation of Total Score  Total Score Depression Severity:  1-4 = Minimal depression, 5-9 = Mild depression, 10-14 = Moderate depression, 15-19 = Moderately severe depression, 20-27 = Severe depression   Psychosocial Evaluation and Intervention:  Psychosocial Evaluation - 11/27/23 1425       Psychosocial Evaluation & Interventions   Interventions Encouraged to exercise with the program and follow exercise prescription    Comments Trey Paula is coming to cardiac rehab after a NSTEMI and stent. He states he is feeling well and just returned to work last week at the [employer]. He has a history of depression which is managed at this time with no concerns. He does report sleep concerns. He falls asleep okay, but then wakes in a few hours. We discussed asking his doctor about medication and proper sleep hygiene. He has a supportive wife and family. He is  looking forward to attending the program to work on his stamina and strength    Expected Outcomes Short: attend cardiac rehab for education and exercise. Long: develop and maintain positive self care habits    Continue Psychosocial Services  Follow up required by staff             Psychosocial Re-Evaluation:   Psychosocial Discharge (Final Psychosocial Re-Evaluation):   Vocational Rehabilitation: Provide vocational rehab assistance to qualifying candidates.   Vocational Rehab Evaluation & Intervention:  Vocational Rehab - 11/27/23 1409       Initial Vocational Rehab Evaluation & Intervention   Assessment shows need for Vocational Rehabilitation No              Education: Education Goals: Education classes will be provided on a variety of topics geared toward better understanding of heart health and risk factor modification. Participant will state understanding/return demonstration of topics presented as noted by education test scores.  Learning Barriers/Preferences:  Learning Barriers/Preferences - 11/27/23 1409       Learning Barriers/Preferences   Learning Barriers None    Learning Preferences None             General Cardiac Education Topics:  AED/CPR: - Group verbal and written instruction with the use of models to demonstrate the basic use of the AED with the basic ABC's of resuscitation.   Anatomy and Cardiac Procedures: - Group verbal and visual presentation and models provide information about basic cardiac anatomy and function. Reviews the testing methods done to diagnose heart disease and the outcomes of the test results. Describes the treatment choices: Medical Management, Angioplasty, or Coronary Bypass Surgery for treating various heart conditions including Myocardial Infarction, Angina, Valve Disease, and Cardiac Arrhythmias.  Written material given at graduation.   Medication Safety: - Group verbal and visual instruction to review commonly prescribed medications for heart and lung disease. Reviews the medication, class of the drug, and side effects. Includes the steps to properly store meds and maintain the prescription regimen.  Written material given at graduation.   Intimacy: - Group verbal instruction through game format to discuss how heart and lung disease can affect sexual intimacy. Written material given at graduation..   Know Your Numbers and Heart Failure: - Group verbal and visual instruction to discuss disease risk factors for cardiac and pulmonary disease and treatment options.  Reviews associated critical values for Overweight/Obesity, Hypertension, Cholesterol, and Diabetes.  Discusses basics of heart  failure: signs/symptoms and treatments.  Introduces Heart Failure Zone chart for action plan for heart failure.  Written material given at graduation.   Infection Prevention: - Provides verbal and written material to individual with discussion of infection control including proper hand washing and proper equipment cleaning during exercise session. Flowsheet Row Cardiac Rehab from 11/28/2023 in [facility] Cardiac and Pulmonary Rehab  Date 11/28/23  Educator [facility]  Instruction Review Code 1- Verbalizes Understanding       Falls Prevention: - Provides verbal and written material to individual with discussion of falls prevention and safety. Flowsheet Row Cardiac Rehab from 11/28/2023 in [facility] Cardiac and Pulmonary Rehab  Date 11/28/23  Educator [facility]  Instruction Review Code 1- Verbalizes Understanding       Other: -Provides group and verbal instruction on various topics (see comments)   Knowledge Questionnaire Score:  Knowledge Questionnaire Score - 11/28/23 1237       Knowledge Questionnaire Score   Pre Score 25/26             Core  Components/Risk Factors/Patient Goals at Admission:  Personal Goals and Risk Factors at Admission - 11/28/23 1238       Core Components/Risk Factors/Patient Goals on Admission    Weight Management Yes    Intervention Weight Management: Develop a combined nutrition and exercise program designed to reach desired caloric intake, while maintaining appropriate intake of nutrient and fiber, sodium and fats, and appropriate energy expenditure required for the weight goal.;Weight Management: Provide education and appropriate resources to help participant work on and attain dietary goals.;Weight Management/Obesity: Establish reasonable short term and long term weight goals.    Admit Weight 220 lb 4.8 oz (99.9 kg)    Goal Weight: Short Term 210 lb (95.3 kg)    Goal Weight: Long Term 190 lb (86.2 kg)    Expected Outcomes Short Term: Continue to assess and modify  interventions until short term weight is achieved;Long Term: Adherence to nutrition and physical activity/exercise program aimed toward attainment of established weight goal;Weight Loss: Understanding of general recommendations for a balanced deficit meal plan, which promotes 1-2 lb weight loss per week and includes a negative energy balance of [phone] kcal/d;Understanding recommendations for meals to include 15-35% energy as protein, 25-35% energy from fat, 35-60% energy from carbohydrates, less than 200mg  of dietary cholesterol, 20-35 gm of total fiber daily;Understanding of distribution of calorie intake throughout the day with the consumption of 4-5 meals/snacks    Diabetes Yes    Intervention Provide education about signs/symptoms and action to take for hypo/hyperglycemia.;Provide education about proper nutrition, including hydration, and aerobic/resistive exercise prescription along with prescribed medications to achieve blood glucose in normal ranges: Fasting glucose 65-99 mg/dL    Expected Outcomes Short Term: Participant verbalizes understanding of the signs/symptoms and immediate care of hyper/hypoglycemia, proper foot care and importance of medication, aerobic/resistive exercise and nutrition plan for blood glucose control.;Long Term: Attainment of HbA1C < 7%.    Hypertension Yes    Intervention Provide education on lifestyle modifcations including regular physical activity/exercise, weight management, moderate sodium restriction and increased consumption of fresh fruit, vegetables, and low fat dairy, alcohol moderation, and smoking cessation.;Monitor prescription use compliance.    Expected Outcomes Short Term: Continued assessment and intervention until BP is < 140/96mm HG in hypertensive participants. < 130/74mm HG in hypertensive participants with diabetes, heart failure or chronic kidney disease.;Long Term: Maintenance of blood pressure at goal levels.    Lipids Yes    Intervention Provide  education and support for participant on nutrition & aerobic/resistive exercise along with prescribed medications to achieve LDL 70mg , HDL >40mg .    Expected Outcomes Short Term: Participant states understanding of desired cholesterol values and is compliant with medications prescribed. Participant is following exercise prescription and nutrition guidelines.;Long Term: Cholesterol controlled with medications as prescribed, with individualized exercise RX and with personalized nutrition plan. Value goals: LDL < 70mg , HDL > 40 mg.             Education:Diabetes - Individual verbal and written instruction to review signs/symptoms of diabetes, desired ranges of glucose level fasting, after meals and with exercise. Acknowledge that pre and post exercise glucose checks will be done for 3 sessions at entry of program. Flowsheet Row Cardiac Rehab from 11/28/2023 in [facility] Cardiac and Pulmonary Rehab  Date 11/28/23  Educator [facility]  Instruction Review Code 1- Verbalizes Understanding       Core Components/Risk Factors/Patient Goals Review:    Core Components/Risk Factors/Patient Goals at Discharge (Final Review):    ITP Comments:  ITP Comments  Row Name 11/27/23 1423 11/28/23 1229         ITP Comments Initial phone call completed. Diagnosis can be found in [facility] 11/8. EP Orientation scheduled for Wednesday 12/4 at 10:30. Completed and gym orientation. Initial ITP created and sent for review to Dr. Bethann Punches, Medical Director.               Comments: initial ITP

## 2023-12-01 DIAGNOSIS — G4733 Obstructive sleep apnea (adult) (pediatric): Secondary | ICD-10-CM | POA: Diagnosis not present

## 2023-12-01 DIAGNOSIS — G4731 Primary central sleep apnea: Secondary | ICD-10-CM | POA: Diagnosis not present

## 2023-12-03 ENCOUNTER — Encounter: Payer: 59 | Admitting: *Deleted

## 2023-12-03 DIAGNOSIS — Z955 Presence of coronary angioplasty implant and graft: Secondary | ICD-10-CM | POA: Diagnosis not present

## 2023-12-03 DIAGNOSIS — I214 Non-ST elevation (NSTEMI) myocardial infarction: Secondary | ICD-10-CM | POA: Diagnosis not present

## 2023-12-03 LAB — GLUCOSE, CAPILLARY
Glucose-Capillary: 125 mg/dL — ABNORMAL HIGH (ref 70–99)
Glucose-Capillary: 99 mg/dL (ref 70–99)

## 2023-12-03 NOTE — Progress Notes (Signed)
Daily Session Note  Patient Details  Name: Kevin Reid MRN: [id number] Date of Birth: 02-09-1961 Referring Provider:   Flowsheet Row Cardiac Rehab from 11/28/2023 in [facility] Cardiac and Pulmonary Rehab  Referring Provider Dr. Dorothyann Peng       Encounter Date: 12/03/2023  Check In:  Session Check In - 12/03/23 1646       Check-In   Supervising physician immediately available to respond to emergencies See telemetry face sheet for immediately available ER MD    Location [facility]-Cardiac & Pulmonary Rehab    Staff Present Susann Givens, RN BSN;Joseph Hood, RCP,RRT,BSRT;Maxon [place] BS, , Exercise Physiologist;Megan Katrinka Blazing, RN, [place]    Virtual Visit No    Medication changes reported     No    Fall or balance concerns reported    No    Warm-up and Cool-down Performed on first and last piece of equipment    Resistance Training Performed Yes    VAD Patient? No    PAD/SET Patient? No      Pain Assessment   Currently in Pain? No/denies                Social History   Tobacco Use  Smoking Status Former   Current packs/day: 0.00   Average packs/day: 1 pack/day for 8.0 years (8.0 ttl pk-yrs)   Types: Cigarettes   Start date: 04/24/2010   Quit date: 04/24/2018   Years since quitting: 5.6  Smokeless Tobacco Former   Types: Chew   Quit date: 11/19/2013    Goals Met:  Independence with exercise equipment Exercise tolerated well No report of concerns or symptoms today Strength training completed today  Goals Unmet:  Not Applicable  Comments: First full day of exercise!  Patient was oriented to gym and equipment including functions, settings, policies, and procedures.  Patient's individual exercise prescription and treatment plan were reviewed.  All starting workloads were established based on the results of the 6 minute walk test done at initial orientation visit.  The plan for exercise progression was also introduced and progression will be customized based on patient's  performance and goals.    Dr. Bethann Punches is Medical Director for [facility] Cardiac Rehabilitation.  Dr. Vida Rigger is Medical Director for [facility] Pulmonary Rehabilitation.

## 2023-12-05 ENCOUNTER — Encounter: Payer: 59 | Admitting: *Deleted

## 2023-12-05 DIAGNOSIS — I214 Non-ST elevation (NSTEMI) myocardial infarction: Secondary | ICD-10-CM | POA: Diagnosis not present

## 2023-12-05 DIAGNOSIS — Z955 Presence of coronary angioplasty implant and graft: Secondary | ICD-10-CM | POA: Diagnosis not present

## 2023-12-05 LAB — GLUCOSE, CAPILLARY
Glucose-Capillary: 98 mg/dL (ref 70–99)
Glucose-Capillary: 102 mg/dL — ABNORMAL HIGH (ref 70–99)

## 2023-12-05 NOTE — Progress Notes (Signed)
Daily Session Note  Patient Details  Name: Kevin Reid MRN: [id number] Date of Birth: [birth year] Referring Provider:   Flowsheet Row Cardiac Rehab from 11/28/2023 in [facility] Cardiac and Pulmonary Rehab  Referring Provider Dr. Dorothyann Peng       Encounter Date: 12/05/2023  Check In:  Session Check In - 12/05/23 1556       Check-In   Supervising physician immediately available to respond to emergencies See telemetry face sheet for immediately available ER MD    Location [facility]-Cardiac & Pulmonary Rehab    Staff Present Susann Givens, RN BSN;Joseph Lykens, [place];Cora Collum, RN, BSN, CCRP;Megan Katrinka Blazing, RN, ADN    Virtual Visit No    Medication changes reported     No    Fall or balance concerns reported    No    Warm-up and Cool-down Performed on first and last piece of equipment    Resistance Training Performed Yes    VAD Patient? No    PAD/SET Patient? No      Pain Assessment   Currently in Pain? No/denies                Social History   Tobacco Use  Smoking Status Former   Current packs/day: 0.00   Average packs/day: 1 pack/day for 8.0 years (8.0 ttl pk-yrs)   Types: Cigarettes   Start date: 04/24/2010   Quit date: 04/24/2018   Years since quitting: 5.6  Smokeless Tobacco Former   Types: Chew   Quit date: 11/19/2013    Goals Met:  Independence with exercise equipment Exercise tolerated well No report of concerns or symptoms today Strength training completed today  Goals Unmet:  Not Applicable  Comments: Pt able to follow exercise prescription today without complaint.  Will continue to monitor for progression.    Dr. Bethann Punches is Medical Director for [facility] Cardiac Rehabilitation.  Dr. Vida Rigger is Medical Director for [facility] Pulmonary Rehabilitation.

## 2023-12-06 ENCOUNTER — Encounter: Payer: 59 | Admitting: *Deleted

## 2023-12-06 DIAGNOSIS — I214 Non-ST elevation (NSTEMI) myocardial infarction: Secondary | ICD-10-CM

## 2023-12-06 DIAGNOSIS — Z955 Presence of coronary angioplasty implant and graft: Secondary | ICD-10-CM | POA: Diagnosis not present

## 2023-12-06 LAB — GLUCOSE, CAPILLARY: Glucose-Capillary: 97 mg/dL (ref 70–99)

## 2023-12-06 NOTE — Progress Notes (Signed)
Daily Session Note  Patient Details  Name: Kevin Reid MRN: [id number] Date of Birth: 05/29/1961 Referring Provider:   Flowsheet Row Cardiac Rehab from 11/28/2023 in [facility] Cardiac and Pulmonary Rehab  Referring Provider Dr. Dorothyann Peng       Encounter Date: 12/06/2023  Check In:  Session Check In - 12/06/23 1543       Check-In   Supervising physician immediately available to respond to emergencies See telemetry face sheet for immediately available ER MD    Staff Present Maxon Suzzette Righter, , Exercise Physiologist;Megan Katrinka Blazing, RN, ADN;Randee [place] Jewel Baize, RN BSN;Joseph Reino [place], RCP,RRT,BSRT    Virtual Visit No    Medication changes reported     No    Fall or balance concerns reported    No    Warm-up and Cool-down Performed on first and last piece of equipment    Resistance Training Performed Yes    VAD Patient? No    PAD/SET Patient? No      Pain Assessment   Currently in Pain? No/denies                Social History   Tobacco Use  Smoking Status Former   Current packs/day: 0.00   Average packs/day: 1 pack/day for 8.0 years (8.0 ttl pk-yrs)   Types: Cigarettes   Start date: 04/24/2010   Quit date: 04/24/2018   Years since quitting: 5.6  Smokeless Tobacco Former   Types: Chew   Quit date: 11/19/2013    Goals Met:  Independence with exercise equipment Exercise tolerated well No report of concerns or symptoms today Strength training completed today  Goals Unmet:  Not Applicable  Comments: Pt able to follow exercise prescription today without complaint.  Will continue to monitor for progression.    Dr. Bethann Punches is Medical Director for [facility] Cardiac Rehabilitation.  Dr. Vida Rigger is Medical Director for [facility] Pulmonary Rehabilitation.

## 2023-12-12 ENCOUNTER — Encounter: Payer: Self-pay | Admitting: *Deleted

## 2023-12-12 ENCOUNTER — Encounter: Payer: 59 | Admitting: *Deleted

## 2023-12-12 DIAGNOSIS — Z955 Presence of coronary angioplasty implant and graft: Secondary | ICD-10-CM

## 2023-12-12 DIAGNOSIS — I214 Non-ST elevation (NSTEMI) myocardial infarction: Secondary | ICD-10-CM

## 2023-12-12 NOTE — Progress Notes (Signed)
Cardiac Individual Treatment Plan  Patient Details  Name: Kevin Reid MRN: [id number] Date of Birth: [birth year] Referring Provider:   Flowsheet Row Cardiac Rehab from 11/28/2023 in [facility] Cardiac and Pulmonary Rehab  Referring Provider Dr. Dorothyann Peng       Initial Encounter Date:  Flowsheet Row Cardiac Rehab from 11/28/2023 in [facility] Cardiac and Pulmonary Rehab  Date 11/28/23       Visit Diagnosis: NSTEMI (non-ST elevated myocardial infarction) [facility])  Status post coronary artery stent placement  Patient's Home Medications on Admission:  Current Outpatient Medications:    amLODipine (NORVASC) 10 MG tablet, Take 1 tablet (10 mg total) by mouth daily., Disp: 30 tablet, Rfl: 0   aspirin 81 MG chewable tablet, Chew 1 tablet (81 mg total) by mouth daily., Disp: 30 tablet, Rfl: 0   atorvastatin (LIPITOR) 80 MG tablet, Take 1 tablet (80 mg total) by mouth daily., Disp: 30 tablet, Rfl: 0   bisoprolol (ZEBETA) 5 MG tablet, Take 0.5 tablets (2.5 mg total) by mouth daily., Disp: 30 tablet, Rfl: 0   Blood Glucose Monitoring Suppl (BLOOD GLUCOSE MONITOR SYSTEM) w/Device KIT, Use to test blood sugar once daily, Disp: 1 kit, Rfl: 0   fenofibrate (TRICOR) 145 MG tablet, Take 1 tablet (145 mg total) by mouth daily., Disp: 90 tablet, Rfl: 3   glucose blood (CONTOUR NEXT TEST) test strip, Check sugar once daily. DX E11.9, Disp: 100 each, Rfl: 12   hydrochlorothiazide (HYDRODIURIL) 25 MG tablet, Take 25 mg by mouth daily., Disp: , Rfl:    Lancets MISC, 1 each by Does not apply route daily as needed., Disp: 200 each, Rfl: 1   levocetirizine (XYZAL) 5 MG tablet, Take 5 mg by mouth every evening. (Patient not taking: Reported on 11/19/2023), Disp: , Rfl:    metFORMIN (GLUCOPHAGE) 1000 MG tablet, Take 1 tablet (1,000 mg total) by mouth 2 (two) times daily., Disp: 180 tablet, Rfl: 1   MOUNJARO 7.5 MG/0.5ML Pen, Inject 7.5 mg into the skin once a week. Sunday, Disp: , Rfl:    NON FORMULARY, CPAP MACHINE  USING EVERY NIGHT setting at 10, Disp: , Rfl:    omeprazole (PRILOSEC) 40 MG capsule, Take by mouth. (Patient not taking: Reported on 11/19/2023), Disp: , Rfl:    PARoxetine (PAXIL) 20 MG tablet, TAKE 3 TABLETS BY MOUTH ONCE DAILY (Patient taking differently: Take 60 mg by mouth daily.), Disp: 90 tablet, Rfl: 0   telmisartan (MICARDIS) 80 MG tablet, Take 80 mg by mouth daily., Disp: , Rfl:    ticagrelor (BRILINTA) 90 MG TABS tablet, Take 1 tablet (90 mg total) by mouth 2 (two) times daily., Disp: 60 tablet, Rfl: 0  Past Medical History: Past Medical History:  Diagnosis Date   Allergy    Depression    Diabetes mellitus without complication (HCC)    GERD (gastroesophageal reflux disease)    Heart murmur    Hyperlipidemia    Hypertension    Joint pain    Sleep apnea     Tobacco Use: Social History   Tobacco Use  Smoking Status Former   Current packs/day: 0.00   Average packs/day: 1 pack/day for 8.0 years (8.0 ttl pk-yrs)   Types: Cigarettes   Start date: 04/24/2010   Quit date: 04/24/2018   Years since quitting: 5.6  Smokeless Tobacco Former   Types: Chew   Quit date: 11/19/2013    Labs: Review Flowsheet  More data exists      Latest Ref Rng & Units 01/26/2020 02/17/2020 12/29/2020  11/02/2023 11/03/2023  Labs for ITP Cardiac and Pulmonary Rehab  Cholestrol 0 - 200 mg/dL 829  562  - - 130   LDL (calc) 0 - 99 mg/dL - 83  - - 74   Direct LDL mg/dL 86.5  - - - -  HDL-C >78 mg/dL 46.96  29  - - 31   Trlycerides <150 mg/dL 295.2 Triglyceride is over 400; calculations on Lipids are invalid.  367  209  - 176   Hemoglobin A1c 4.8 - 5.6 % - 7.0  7.2  5.4  -     Exercise Target Goals: Exercise Program Goal: Individual exercise prescription set using results from initial 6 min walk test and THRR while considering  patient's activity barriers and safety.   Exercise Prescription Goal: Initial exercise prescription builds to 30-45 minutes a day of aerobic activity, 2-3 days per week.  Home  exercise guidelines will be given to patient during program as part of exercise prescription that the participant will acknowledge.   Education: Aerobic Exercise: - Group verbal and visual presentation on the components of exercise prescription. Introduces F.I.T.T principle from ACSM for exercise prescriptions.  Reviews F.I.T.T. principles of aerobic exercise including progression. Written material given at graduation.   Education: Resistance Exercise: - Group verbal and visual presentation on the components of exercise prescription. Introduces F.I.T.T principle from ACSM for exercise prescriptions  Reviews F.I.T.T. principles of resistance exercise including progression. Written material given at graduation.    Education: Exercise & Equipment Safety: - Individual verbal instruction and demonstration of equipment use and safety with use of the equipment. Flowsheet Row Cardiac Rehab from 11/28/2023 in [facility] Cardiac and Pulmonary Rehab  Date 11/28/23  Educator [facility]  Instruction Review Code 1- Verbalizes Understanding       Education: Exercise Physiology & General Exercise Guidelines: - Group verbal and written instruction with models to review the exercise physiology of the cardiovascular system and associated critical values. Provides general exercise guidelines with specific guidelines to those with heart or lung disease.    Education: Flexibility, Balance, Mind/Body Relaxation: - Group verbal and visual presentation with interactive activity on the components of exercise prescription. Introduces F.I.T.T principle from ACSM for exercise prescriptions. Reviews F.I.T.T. principles of flexibility and balance exercise training including progression. Also discusses the mind body connection.  Reviews various relaxation techniques to help reduce and manage stress (i.e. Deep breathing, progressive muscle relaxation, and visualization). Balance handout provided to take home. Written material given at  graduation.   Activity Barriers & Risk Stratification:  Activity Barriers & Cardiac Risk Stratification - 11/28/23 1232       Activity Barriers & Cardiac Risk Stratification   Activity Barriers Joint Problems    Cardiac Risk Stratification High             6 Minute Walk:  6 Minute Walk     Row Name 11/28/23 1231         6 Minute Walk   Phase Initial     Distance 1350 feet     Walk Time 6 minutes     MPH 2.56     METS 3.17     RPE 9     Perceived Dyspnea  0     VO2 Peak 11.1     Symptoms No     Resting HR 52 bpm     Resting BP 138/82     Resting Oxygen Saturation  97 %     Exercise Oxygen Saturation  during 6 min  walk 97 %     Max Ex. HR 71 bpm     Max Ex. BP 154/82     2 Minute Post BP 140/80              Oxygen Initial Assessment:   Oxygen Re-Evaluation:   Oxygen Discharge (Final Oxygen Re-Evaluation):   Initial Exercise Prescription:  Initial Exercise Prescription - 11/28/23 1200       Date of Initial Exercise RX and Referring Provider   Date 11/28/23    Referring Provider Dr. Dorothyann Peng      Oxygen   Maintain Oxygen Saturation 88% or higher      Treadmill   MPH 2.6    Grade 1    Minutes 15    METs 3.35      Recumbant Bike   Level 3    RPM 50    Watts 20    Minutes 15    METs 3.17      NuStep   Level 3   T4 and T6   SPM 80    Minutes 15    METs 3.17      Elliptical   Level 1    Speed 3    Minutes 15    METs 3.17      Biostep-RELP   Level 3    SPM 50    Minutes 15    METs 3.17      Prescription Details   Frequency (times per week) 3    Duration Progress to 30 minutes of continuous aerobic without signs/symptoms of physical distress      Intensity   THRR 40-80% of Max Heartrate 94-138    Ratings of Perceived Exertion 11-13    Perceived Dyspnea 0-4      Progression   Progression Continue to progress workloads to maintain intensity without signs/symptoms of physical distress.      Resistance Training    Training Prescription Yes    Weight 4    Reps 10-15             Perform Capillary Blood Glucose checks as needed.  Exercise Prescription Changes:   Exercise Prescription Changes     Row Name 11/28/23 1200 12/11/23 1500           Response to Exercise   Blood Pressure (Admit) 138/82 148/80      Blood Pressure (Exercise) 154/82 150/88      Blood Pressure (Exit) 140/80 146/80      Heart Rate (Admit) 52 bpm 57 bpm      Heart Rate (Exercise) 71 bpm 86 bpm      Heart Rate (Exit) 53 bpm 61 bpm      Oxygen Saturation (Admit) 97 % --      Oxygen Saturation (Exercise) 97 % --      Oxygen Saturation (Exit) 98 % --      Rating of Perceived Exertion (Exercise) 9 13      Perceived Dyspnea (Exercise) 0 --      Symptoms none none      Comments 6 MWT results First three days of exercise      Duration -- Progress to 30 minutes of  aerobic without signs/symptoms of physical distress      Intensity -- THRR unchanged        Progression   Progression -- Continue to progress workloads to maintain intensity without signs/symptoms of physical distress.      Average METs -- 3.01  Resistance Training   Training Prescription -- Yes      Weight -- 4 lb      Reps -- 10-15        Interval Training   Interval Training -- No        Treadmill   MPH -- 2.6      Grade -- 1      Minutes -- 15      METs -- 3.35        Elliptical   Level -- 1      Speed -- 3      Minutes -- 15        Biostep-RELP   Level -- 2      Minutes -- 15      METs -- 2        Oxygen   Maintain Oxygen Saturation -- 88% or higher               Exercise Comments:   Exercise Comments     Row Name 12/03/23 1647           Exercise Comments First full day of exercise!  Patient was oriented to gym and equipment including functions, settings, policies, and procedures.  Patient's individual exercise prescription and treatment plan were reviewed.  All starting workloads were established based on the  results of the 6 minute walk test done at initial orientation visit.  The plan for exercise progression was also introduced and progression will be customized based on patient's performance and goals.                Exercise Goals and Review:   Exercise Goals     Row Name 11/28/23 1236             Exercise Goals   Increase Physical Activity Yes       Intervention Provide advice, education, support and counseling about physical activity/exercise needs.;Develop an individualized exercise prescription for aerobic and resistive training based on initial evaluation findings, risk stratification, comorbidities and participant's personal goals.       Expected Outcomes Short Term: Attend rehab on a regular basis to increase amount of physical activity.;Long Term: Add in home exercise to make exercise part of routine and to increase amount of physical activity.;Long Term: Exercising regularly at least 3-5 days a week.       Increase Strength and Stamina Yes       Intervention Provide advice, education, support and counseling about physical activity/exercise needs.;Develop an individualized exercise prescription for aerobic and resistive training based on initial evaluation findings, risk stratification, comorbidities and participant's personal goals.       Expected Outcomes Short Term: Increase workloads from initial exercise prescription for resistance, speed, and METs.;Short Term: Perform resistance training exercises routinely during rehab and add in resistance training at home;Long Term: Improve cardiorespiratory fitness, muscular endurance and strength as measured by increased METs and functional capacity ( )       Able to understand and use rate of perceived exertion (RPE) scale Yes       Intervention Provide education and explanation on how to use RPE scale       Expected Outcomes Short Term: Able to use RPE daily in rehab to express subjective intensity level;Long Term:  Able to use RPE  to guide intensity level when exercising independently       Able to understand and use Dyspnea scale Yes       Intervention Provide education and explanation on  how to use Dyspnea scale       Expected Outcomes Short Term: Able to use Dyspnea scale daily in rehab to express subjective sense of shortness of breath during exertion;Long Term: Able to use Dyspnea scale to guide intensity level when exercising independently       Knowledge and understanding of Target Heart Rate Range (THRR) Yes       Intervention Provide education and explanation of THRR including how the numbers were predicted and where they are located for reference       Expected Outcomes Short Term: Able to state/look up THRR;Long Term: Able to use THRR to govern intensity when exercising independently;Short Term: Able to use daily as guideline for intensity in rehab       Able to check pulse independently Yes       Intervention Provide education and demonstration on how to check pulse in carotid and radial arteries.;Review the importance of being able to check your own pulse for safety during independent exercise       Expected Outcomes Short Term: Able to explain why pulse checking is important during independent exercise;Long Term: Able to check pulse independently and accurately       Understanding of Exercise Prescription Yes       Intervention Provide education, explanation, and written materials on patient's individual exercise prescription       Expected Outcomes Short Term: Able to explain program exercise prescription;Long Term: Able to explain home exercise prescription to exercise independently                Exercise Goals Re-Evaluation :  Exercise Goals Re-Evaluation     Row Name 12/03/23 1647 12/11/23 1527           Exercise Goal Re-Evaluation   Exercise Goals Review Increase Physical Activity;Able to understand and use rate of perceived exertion (RPE) scale;Knowledge and understanding of Target Heart Rate  Range (THRR);Understanding of Exercise Prescription;Increase Strength and Stamina;Able to check pulse independently Increase Physical Activity;Increase Strength and Stamina;Understanding of Exercise Prescription      Comments Reviewed RPE and dyspnea scale, THR and program prescription with pt today.  Pt voiced understanding and was given a copy of goals to take home. Trey Paula is off to a good start in the program. He has done well on the treadmill at a speed of 2.6 mph with an incline of 1%. He also has tolerated the elliptical at level 1 and the biostep at level 2. He used 4 lb hand weights for resistance training as well. We will continue to monitor his progress in the program.      Expected Outcomes Short: Use RPE daily to regulate intensity.  Long: Follow program prescription in THR. Short: Continue to follow current exercise prescription. Long: Continue exercise to improve strength and stamina.               Discharge Exercise Prescription (Final Exercise Prescription Changes):  Exercise Prescription Changes - 12/11/23 1500       Response to Exercise   Blood Pressure (Admit) 148/80    Blood Pressure (Exercise) 150/88    Blood Pressure (Exit) 146/80    Heart Rate (Admit) 57 bpm    Heart Rate (Exercise) 86 bpm    Heart Rate (Exit) 61 bpm    Rating of Perceived Exertion (Exercise) 13    Symptoms none    Comments First three days of exercise    Duration Progress to 30 minutes of  aerobic without signs/symptoms of physical  distress    Intensity THRR unchanged      Progression   Progression Continue to progress workloads to maintain intensity without signs/symptoms of physical distress.    Average METs 3.01      Resistance Training   Training Prescription Yes    Weight 4 lb    Reps 10-15      Interval Training   Interval Training No      Treadmill   MPH 2.6    Grade 1    Minutes 15    METs 3.35      Elliptical   Level 1    Speed 3    Minutes 15      Biostep-RELP   Level  2    Minutes 15    METs 2      Oxygen   Maintain Oxygen Saturation 88% or higher             Nutrition:  Target Goals: Understanding of nutrition guidelines, daily intake of sodium 1500mg , cholesterol 200mg , calories 30% from fat and 7% or less from saturated fats, daily to have 5 or more servings of fruits and vegetables.  Education: All About Nutrition: -Group instruction provided by verbal, written material, interactive activities, discussions, models, and posters to present general guidelines for heart healthy nutrition including fat, fiber, MyPlate, the role of sodium in heart healthy nutrition, utilization of the nutrition label, and utilization of this knowledge for meal planning. Follow up email sent as well. Written material given at graduation.   Biometrics:  Pre Biometrics - 11/28/23 1237       Pre Biometrics   Height 5\' 11"  (1.803 m)    Weight 220 lb 4.8 oz (99.9 kg)    Waist Circumference 44 inches    Hip Circumference 42 inches    Waist to Hip Ratio 1.05 %    BMI (Calculated) 30.74    Single Leg Stand 30 seconds              Nutrition Therapy Plan and Nutrition Goals:  Nutrition Therapy & Goals - 11/28/23 1345       Nutrition Therapy   Diet Carc controlled, cardiac, low Na    Protein (specify units) 90    Fiber 30 grams    Whole Grain Foods 3 servings    Saturated Fats 15 max. grams    Fruits and Vegetables 5 servings/day    Sodium 2 grams      Personal Nutrition Goals   Nutrition Goal Read labels and reduce sodium intake to below 2300mg . Ideally 1500mg  per day.    Personal Goal #2 Reduce saturated fat, less than 12g per day. Replace bad fats for more heart healthy fats.    Personal Goal #3 Eat 15-30gProtein and 30-60gCarbs at each meal.    Comments Patient drinking mostly diet dr pepper. Switched from regularly sodas to diet, commended him on the change and encouraged him to continue to choose non sugary beverages. His A1C appears to have  improved drastically from ~7% to 5.4%. He has never counted carbs, but tried to limit starchy foods. Spoke with him about quality of carbs, encouraging more whole grains and natural foods rather than refined carb choices. He likes whole grain breads and brown rice. Also discussed carb quantity and target of ~30-60g per meal. Reviewed Mediterranean diet handout, educated on types of fats, sources, and how to read label. Went over several of his favorite foods facts label and recommended changes when needed. Set goal to  read labels and watch sodium more carefully. Built out several meals and snacks with foods he likes and will eat focusing on less sodium and saturated fat with more balance between complex carbs and healthy protein or fat      Intervention Plan   Intervention Prescribe, educate and counsel regarding individualized specific dietary modifications aiming towards targeted core components such as weight, hypertension, lipid management, diabetes, heart failure and other comorbidities.;Nutrition handout(s) given to patient.    Expected Outcomes Short Term Goal: Understand basic principles of dietary content, such as calories, fat, sodium, cholesterol and nutrients.;Short Term Goal: A plan has been developed with personal nutrition goals set during dietitian appointment.;Long Term Goal: Adherence to prescribed nutrition plan.             Nutrition Assessments:  MEDIFICTS Score Key: >=70 Need to make dietary changes  40-70 Heart Healthy Diet <= 40 Therapeutic Level Cholesterol Diet  Flowsheet Row Cardiac Rehab from 11/28/2023 in [facility] Cardiac and Pulmonary Rehab  Picture Your Plate Total Score on Admission 65      Picture Your Plate Scores: <32 Unhealthy dietary pattern with much room for improvement. 41-50 Dietary pattern unlikely to meet recommendations for good health and room for improvement. 51-60 More healthful dietary pattern, with some room for improvement.  >60 Healthy dietary  pattern, although there may be some specific behaviors that could be improved.    Nutrition Goals Re-Evaluation:   Nutrition Goals Discharge (Final Nutrition Goals Re-Evaluation):   Psychosocial: Target Goals: Acknowledge presence or absence of significant depression and/or stress, maximize coping skills, provide positive support system. Participant is able to verbalize types and ability to use techniques and skills needed for reducing stress and depression.   Education: Stress, Anxiety, and Depression - Group verbal and visual presentation to define topics covered.  Reviews how body is impacted by stress, anxiety, and depression.  Also discusses healthy ways to reduce stress and to treat/manage anxiety and depression.  Written material given at graduation. Flowsheet Row Cardiac Rehab from 11/28/2023 in [facility] Cardiac and Pulmonary Rehab  Education need identified 11/28/23       Education: Sleep Hygiene -Provides group verbal and written instruction about how sleep can affect your health.  Define sleep hygiene, discuss sleep cycles and impact of sleep habits. Review good sleep hygiene tips.    Initial Review & Psychosocial Screening:  Initial Psych Review & Screening - 11/27/23 1410       Initial Review   Current issues with History of Depression      Family Dynamics   Good Support System? Yes   wife, family     Barriers   Psychosocial barriers to participate in program There are no identifiable barriers or psychosocial needs.;The patient should benefit from training in stress management and relaxation.      Screening Interventions   Interventions Encouraged to exercise;Provide feedback about the scores to participant;To provide support and resources with identified psychosocial needs    Expected Outcomes Short Term goal: Utilizing psychosocial counselor, staff and physician to assist with identification of specific Stressors or current issues interfering with healing process.  Setting desired goal for each stressor or current issue identified.;Long Term Goal: Stressors or current issues are controlled or eliminated.;Short Term goal: Identification and review with participant of any Quality of Life or Depression concerns found by scoring the questionnaire.;Long Term goal: The participant improves quality of Life and PHQ9 Scores as seen by post scores and/or verbalization of changes  Quality of Life Scores:   Quality of Life - 11/28/23 1237       Quality of Life   Select Quality of Life      Quality of Life Scores   Health/Function Pre 25.29 %    Socioeconomic Pre 26.5 %    Psych/Spiritual Pre 28.29 %    Family Pre 28 %    GLOBAL Pre 26.5 %            Scores of 19 and below usually indicate a poorer quality of life in these areas.  A difference of  2-3 points is a clinically meaningful difference.  A difference of 2-3 points in the total score of the Quality of Life Index has been associated with significant improvement in overall quality of life, self-image, physical symptoms, and general health in studies assessing change in quality of life.  PHQ-9: Review Flowsheet       11/28/2023 10/18/2020 02/17/2020 11/19/2018  Depression screen PHQ 2/9  Decreased Interest 1 0 1 0 0  Down, Depressed, Hopeless 0 - 0 1 1  PHQ - 2 Score 1 0 1 1 1   Altered sleeping 1 - 0 3  Tired, decreased energy 2 - 2 1  Change in appetite 1 - 0 0  Feeling bad or failure about yourself  0 - 0 0  Trouble concentrating 1 - 0 1  Moving slowly or fidgety/restless 0 - 0 0  Suicidal thoughts 0 - 0 0  PHQ-9 Score 6 - 3 6  Difficult doing work/chores Not difficult at all - Not difficult at all Not difficult at all    Details       Multiple values from one day are sorted in reverse-chronological order        Interpretation of Total Score  Total Score Depression Severity:  1-4 = Minimal depression, 5-9 = Mild depression, 10-14 = Moderate depression, 15-19 =  Moderately severe depression, 20-27 = Severe depression   Psychosocial Evaluation and Intervention:  Psychosocial Evaluation - 11/27/23 1425       Psychosocial Evaluation & Interventions   Interventions Encouraged to exercise with the program and follow exercise prescription    Comments Trey Paula is coming to cardiac rehab after a NSTEMI and stent. He states he is feeling well and just returned to work last week at the [employer]. He has a history of depression which is managed at this time with no concerns. He does report sleep concerns. He falls asleep okay, but then wakes in a few hours. We discussed asking his doctor about medication and proper sleep hygiene. He has a supportive wife and family. He is looking forward to attending the program to work on his stamina and strength    Expected Outcomes Short: attend cardiac rehab for education and exercise. Long: develop and maintain positive self care habits    Continue Psychosocial Services  Follow up required by staff             Psychosocial Re-Evaluation:   Psychosocial Discharge (Final Psychosocial Re-Evaluation):   Vocational Rehabilitation: Provide vocational rehab assistance to qualifying candidates.   Vocational Rehab Evaluation & Intervention:  Vocational Rehab - 11/27/23 1409       Initial Vocational Rehab Evaluation & Intervention   Assessment shows need for Vocational Rehabilitation No             Education: Education Goals: Education classes will be provided on a variety of topics geared toward better understanding of heart health and risk  factor modification. Participant will state understanding/return demonstration of topics presented as noted by education test scores.  Learning Barriers/Preferences:  Learning Barriers/Preferences - 11/27/23 1409       Learning Barriers/Preferences   Learning Barriers None    Learning Preferences None             General Cardiac Education Topics:  AED/CPR: -  Group verbal and written instruction with the use of models to demonstrate the basic use of the AED with the basic ABC's of resuscitation.   Anatomy and Cardiac Procedures: - Group verbal and visual presentation and models provide information about basic cardiac anatomy and function. Reviews the testing methods done to diagnose heart disease and the outcomes of the test results. Describes the treatment choices: Medical Management, Angioplasty, or Coronary Bypass Surgery for treating various heart conditions including Myocardial Infarction, Angina, Valve Disease, and Cardiac Arrhythmias.  Written material given at graduation.   Medication Safety: - Group verbal and visual instruction to review commonly prescribed medications for heart and lung disease. Reviews the medication, class of the drug, and side effects. Includes the steps to properly store meds and maintain the prescription regimen.  Written material given at graduation.   Intimacy: - Group verbal instruction through game format to discuss how heart and lung disease can affect sexual intimacy. Written material given at graduation..   Know Your Numbers and Heart Failure: - Group verbal and visual instruction to discuss disease risk factors for cardiac and pulmonary disease and treatment options.  Reviews associated critical values for Overweight/Obesity, Hypertension, Cholesterol, and Diabetes.  Discusses basics of heart failure: signs/symptoms and treatments.  Introduces Heart Failure Zone chart for action plan for heart failure.  Written material given at graduation.   Infection Prevention: - Provides verbal and written material to individual with discussion of infection control including proper hand washing and proper equipment cleaning during exercise session. Flowsheet Row Cardiac Rehab from 11/28/2023 in [facility] Cardiac and Pulmonary Rehab  Date 11/28/23  Educator [facility]  Instruction Review Code 1- Verbalizes Understanding        Falls Prevention: - Provides verbal and written material to individual with discussion of falls prevention and safety. Flowsheet Row Cardiac Rehab from 11/28/2023 in [facility] Cardiac and Pulmonary Rehab  Date 11/28/23  Educator [facility]  Instruction Review Code 1- Verbalizes Understanding       Other: -Provides group and verbal instruction on various topics (see comments)   Knowledge Questionnaire Score:  Knowledge Questionnaire Score - 11/28/23 1237       Knowledge Questionnaire Score   Pre Score 25/26             Core Components/Risk Factors/Patient Goals at Admission:  Personal Goals and Risk Factors at Admission - 11/28/23 1238       Core Components/Risk Factors/Patient Goals on Admission    Weight Management Yes    Intervention Weight Management: Develop a combined nutrition and exercise program designed to reach desired caloric intake, while maintaining appropriate intake of nutrient and fiber, sodium and fats, and appropriate energy expenditure required for the weight goal.;Weight Management: Provide education and appropriate resources to help participant work on and attain dietary goals.;Weight Management/Obesity: Establish reasonable short term and long term weight goals.    Admit Weight 220 lb 4.8 oz (99.9 kg)    Goal Weight: Short Term 210 lb (95.3 kg)    Goal Weight: Long Term 190 lb (86.2 kg)    Expected Outcomes Short Term: Continue to assess and modify interventions until short  term weight is achieved;Long Term: Adherence to nutrition and physical activity/exercise program aimed toward attainment of established weight goal;Weight Loss: Understanding of general recommendations for a balanced deficit meal plan, which promotes 1-2 lb weight loss per week and includes a negative energy balance of [phone] kcal/d;Understanding recommendations for meals to include 15-35% energy as protein, 25-35% energy from fat, 35-60% energy from carbohydrates, less than 200mg  of dietary  cholesterol, 20-35 gm of total fiber daily;Understanding of distribution of calorie intake throughout the day with the consumption of 4-5 meals/snacks    Diabetes Yes    Intervention Provide education about signs/symptoms and action to take for hypo/hyperglycemia.;Provide education about proper nutrition, including hydration, and aerobic/resistive exercise prescription along with prescribed medications to achieve blood glucose in normal ranges: Fasting glucose 65-99 mg/dL    Expected Outcomes Short Term: Participant verbalizes understanding of the signs/symptoms and immediate care of hyper/hypoglycemia, proper foot care and importance of medication, aerobic/resistive exercise and nutrition plan for blood glucose control.;Long Term: Attainment of HbA1C < 7%.    Hypertension Yes    Intervention Provide education on lifestyle modifcations including regular physical activity/exercise, weight management, moderate sodium restriction and increased consumption of fresh fruit, vegetables, and low fat dairy, alcohol moderation, and smoking cessation.;Monitor prescription use compliance.    Expected Outcomes Short Term: Continued assessment and intervention until BP is < 140/56mm HG in hypertensive participants. < 130/80mm HG in hypertensive participants with diabetes, heart failure or chronic kidney disease.;Long Term: Maintenance of blood pressure at goal levels.    Lipids Yes    Intervention Provide education and support for participant on nutrition & aerobic/resistive exercise along with prescribed medications to achieve LDL 70mg , HDL >40mg .    Expected Outcomes Short Term: Participant states understanding of desired cholesterol values and is compliant with medications prescribed. Participant is following exercise prescription and nutrition guidelines.;Long Term: Cholesterol controlled with medications as prescribed, with individualized exercise RX and with personalized nutrition plan. Value goals: LDL < 70mg ,  HDL > 40 mg.             Education:Diabetes - Individual verbal and written instruction to review signs/symptoms of diabetes, desired ranges of glucose level fasting, after meals and with exercise. Acknowledge that pre and post exercise glucose checks will be done for 3 sessions at entry of program. Flowsheet Row Cardiac Rehab from 11/28/2023 in [facility] Cardiac and Pulmonary Rehab  Date 11/28/23  Educator [facility]  Instruction Review Code 1- Verbalizes Understanding       Core Components/Risk Factors/Patient Goals Review:    Core Components/Risk Factors/Patient Goals at Discharge (Final Review):    ITP Comments:  ITP Comments     Row Name 11/27/23 1423 11/28/23 1229 12/03/23 1647 12/12/23 1158     ITP Comments Initial phone call completed. Diagnosis can be found in [facility] 11/8. EP Orientation scheduled for Wednesday 12/4 at 10:30. Completed and gym orientation. Initial ITP created and sent for review to Dr. Bethann Punches, Medical Director. First full day of exercise!  Patient was oriented to gym and equipment including functions, settings, policies, and procedures.  Patient's individual exercise prescription and treatment plan were reviewed.  All starting workloads were established based on the results of the 6 minute walk test done at initial orientation visit.  The plan for exercise progression was also introduced and progression will be customized based on patient's performance and goals. 30 Day review completed. Medical Director ITP review done, changes made as directed, and signed approval by Medical Director.  new to program             Comments:

## 2023-12-12 NOTE — Progress Notes (Signed)
Daily Session Note  Patient Details  Name: Kevin Reid MRN: [id number] Date of Birth: [birth year] Referring Provider:   Flowsheet Row Cardiac Rehab from 11/28/2023 in [facility] Cardiac and Pulmonary Rehab  Referring Provider Dr. Dorothyann Peng       Encounter Date: 12/12/2023  Check In:  Session Check In - 12/12/23 1554       Check-In   Supervising physician immediately available to respond to emergencies See telemetry face sheet for immediately available ER MD    Location [facility]-Cardiac & Pulmonary Rehab    Staff Present Susann Givens, RN BSN;Joseph Kendleton, RCP,RRT,BSRT;Kelly [place], BS, ACSM CEP, Exercise Physiologist;Maxon Conetta BS, , Exercise Physiologist    Virtual Visit No    Medication changes reported     No    Fall or balance concerns reported    No    Warm-up and Cool-down Performed on first and last piece of equipment    Resistance Training Performed Yes    VAD Patient? No    PAD/SET Patient? No      Pain Assessment   Currently in Pain? No/denies                Social History   Tobacco Use  Smoking Status Former   Current packs/day: 0.00   Average packs/day: 1 pack/day for 8.0 years (8.0 ttl pk-yrs)   Types: Cigarettes   Start date: 04/24/2010   Quit date: 04/24/2018   Years since quitting: 5.6  Smokeless Tobacco Former   Types: Chew   Quit date: 11/19/2013    Goals Met:  Independence with exercise equipment Exercise tolerated well No report of concerns or symptoms today Strength training completed today  Goals Unmet:  Not Applicable  Comments: Pt able to follow exercise prescription today without complaint.  Will continue to monitor for progression.    Dr. Bethann Punches is Medical Director for [facility] Cardiac Rehabilitation.  Dr. Vida Rigger is Medical Director for [facility] Pulmonary Rehabilitation.

## 2023-12-17 ENCOUNTER — Encounter: Payer: 59 | Admitting: *Deleted

## 2023-12-17 DIAGNOSIS — Z955 Presence of coronary angioplasty implant and graft: Secondary | ICD-10-CM | POA: Diagnosis not present

## 2023-12-17 DIAGNOSIS — I214 Non-ST elevation (NSTEMI) myocardial infarction: Secondary | ICD-10-CM

## 2023-12-17 NOTE — Progress Notes (Signed)
Daily Session Note  Patient Details  Name: Kevin Reid MRN: [id number] Date of Birth: 16-Feb-1961 Referring Provider:   Flowsheet Row Cardiac Rehab from 11/28/2023 in [facility] Cardiac and Pulmonary Rehab  Referring Provider Dr. Dorothyann Peng       Encounter Date: 12/17/2023  Check In:  Session Check In - 12/17/23 1602       Check-In   Supervising physician immediately available to respond to emergencies See telemetry face sheet for immediately available ER MD    Location [facility]-Cardiac & Pulmonary Rehab    Staff Present Susann Givens, RN BSN;Joseph Reino Kent, RCP,RRT,BSRT;Kristen Coble, RN,BC,MSN;Megan Katrinka Blazing, RN, [place]    Virtual Visit No    Medication changes reported     No    Fall or balance concerns reported    No    Warm-up and Cool-down Performed on first and last piece of equipment    Resistance Training Performed Yes    VAD Patient? No    PAD/SET Patient? No      Pain Assessment   Currently in Pain? No/denies                Social History   Tobacco Use  Smoking Status Former   Current packs/day: 0.00   Average packs/day: 1 pack/day for 8.0 years (8.0 ttl pk-yrs)   Types: Cigarettes   Start date: 04/24/2010   Quit date: 04/24/2018   Years since quitting: 5.6  Smokeless Tobacco Former   Types: Chew   Quit date: 11/19/2013    Goals Met:  Independence with exercise equipment Exercise tolerated well No report of concerns or symptoms today Strength training completed today  Goals Unmet:  Not Applicable  Comments: Pt able to follow exercise prescription today without complaint.  Will continue to monitor for progression.    Dr. Bethann Punches is Medical Director for [facility] Cardiac Rehabilitation.  Dr. Vida Rigger is Medical Director for [facility] Pulmonary Rehabilitation.

## 2023-12-24 ENCOUNTER — Encounter: Payer: 59 | Admitting: *Deleted

## 2023-12-24 DIAGNOSIS — Z955 Presence of coronary angioplasty implant and graft: Secondary | ICD-10-CM

## 2023-12-24 DIAGNOSIS — I214 Non-ST elevation (NSTEMI) myocardial infarction: Secondary | ICD-10-CM

## 2023-12-24 NOTE — Progress Notes (Signed)
Daily Session Note  Patient Details  Name: Kevin Reid MRN: [id number] Date of Birth: 20-Jul-1961 Referring Provider:   Flowsheet Row Cardiac Rehab from 11/28/2023 in [facility] Cardiac and Pulmonary Rehab  Referring Provider Dr. Dorothyann Peng       Encounter Date: 12/24/2023  Check In:  Session Check In - 12/24/23 1553       Check-In   Supervising physician immediately available to respond to emergencies See telemetry face sheet for immediately available ER MD    Location [facility]-Cardiac & Pulmonary Rehab    Staff Present Susann Givens, RN BSN;Joseph Reino Kent, RCP,RRT,BSRT;Laureen Manson Passey, [place], RRT, CPFT;Megan Katrinka Blazing, RN, ADN    Virtual Visit No    Medication changes reported     No    Fall or balance concerns reported    No    Warm-up and Cool-down Performed on first and last piece of equipment    Resistance Training Performed Yes    VAD Patient? No    PAD/SET Patient? No      Pain Assessment   Currently in Pain? No/denies                Social History   Tobacco Use  Smoking Status Former   Current packs/day: 0.00   Average packs/day: 1 pack/day for 8.0 years (8.0 ttl pk-yrs)   Types: Cigarettes   Start date: 04/24/2010   Quit date: 04/24/2018   Years since quitting: 5.6  Smokeless Tobacco Former   Types: Chew   Quit date: 11/19/2013    Goals Met:  Independence with exercise equipment Exercise tolerated well No report of concerns or symptoms today Strength training completed today  Goals Unmet:  Not Applicable  Comments: Pt able to follow exercise prescription today without complaint.  Will continue to monitor for progression.    Dr. Bethann Punches is Medical Director for [facility] Cardiac Rehabilitation.  Dr. Vida Rigger is Medical Director for [facility] Pulmonary Rehabilitation.

## 2023-12-27 ENCOUNTER — Encounter: Payer: 59 | Attending: Internal Medicine | Admitting: *Deleted

## 2023-12-27 DIAGNOSIS — Z955 Presence of coronary angioplasty implant and graft: Secondary | ICD-10-CM | POA: Insufficient documentation

## 2023-12-27 DIAGNOSIS — I214 Non-ST elevation (NSTEMI) myocardial infarction: Secondary | ICD-10-CM | POA: Diagnosis not present

## 2023-12-27 NOTE — Progress Notes (Signed)
 Daily Session Note  Patient Details  Name: Elim Economou MRN: [id number] Date of Birth: 03/31/1961 Referring Provider:   Flowsheet Row Cardiac Rehab from 11/28/2023 in [facility] Cardiac and Pulmonary Rehab  Referring Provider Dr. Cara Lovelace       Encounter Date: 12/27/2023  Check In:  Session Check In - 12/27/23 1617       Check-In   Supervising physician immediately available to respond to emergencies See telemetry face sheet for immediately available ER MD    Location [facility]-Cardiac & Pulmonary Rehab    Staff Present Hoy Rodney, RN BSN;Maxon Conetta BS, , Exercise Physiologist;Megan Claudene, RN, ADN    Virtual Visit No    Medication changes reported     No    Fall or balance concerns reported    No    Warm-up and Cool-down Performed on first and last piece of equipment    Resistance Training Performed Yes    VAD Patient? No    PAD/SET Patient? No      Pain Assessment   Currently in Pain? No/denies                Social History   Tobacco Use  Smoking Status Former   Current packs/day: 0.00   Average packs/day: 1 pack/day for 8.0 years (8.0 ttl pk-yrs)   Types: Cigarettes   Start date: 04/24/2010   Quit date: 04/24/2018   Years since quitting: 5.6  Smokeless Tobacco Former   Types: Chew   Quit date: 11/19/2013    Goals Met:  Independence with exercise equipment Exercise tolerated well No report of concerns or symptoms today  Goals Unmet:  Not Applicable  Comments: Pt able to follow exercise prescription today without complaint.  Will continue to monitor for progression.    Dr. Oneil Pinal is Medical Director for [facility] Cardiac Rehabilitation.  Dr. Fuad Aleskerov is Medical Director for [facility] Pulmonary Rehabilitation.

## 2023-12-31 ENCOUNTER — Encounter: Payer: 59 | Admitting: *Deleted

## 2023-12-31 DIAGNOSIS — Z955 Presence of coronary angioplasty implant and graft: Secondary | ICD-10-CM

## 2023-12-31 DIAGNOSIS — H2513 Age-related nuclear cataract, bilateral: Secondary | ICD-10-CM | POA: Diagnosis not present

## 2023-12-31 DIAGNOSIS — I214 Non-ST elevation (NSTEMI) myocardial infarction: Secondary | ICD-10-CM | POA: Diagnosis not present

## 2023-12-31 DIAGNOSIS — E119 Type 2 diabetes mellitus without complications: Secondary | ICD-10-CM | POA: Diagnosis not present

## 2023-12-31 NOTE — Progress Notes (Signed)
 Daily Session Note  Patient Details  Name: Kevin Reid MRN: [id number] Date of Birth: [birth year] Referring Provider:   Flowsheet Row Cardiac Rehab from 11/28/2023 in [facility] Cardiac and Pulmonary Rehab  Referring Provider Dr. Cara Lovelace       Encounter Date: 12/31/2023  Check In:  Session Check In - 12/31/23 1541       Check-In   Supervising physician immediately available to respond to emergencies See telemetry face sheet for immediately available ER MD    Location [facility]-Cardiac & Pulmonary Rehab    Staff Present Hoy Rodney, RN BSN;Maxon Conetta BS, , Exercise Physiologist;Megan Claudene, RN, ADN;Joseph Rolinda, RCP,RRT,BSRT    Virtual Visit No    Medication changes reported     No    Fall or balance concerns reported    No    Warm-up and Cool-down Performed on first and last piece of equipment    Resistance Training Performed Yes    VAD Patient? No    PAD/SET Patient? No      Pain Assessment   Currently in Pain? No/denies                Social History   Tobacco Use  Smoking Status Former   Current packs/day: 0.00   Average packs/day: 1 pack/day for 8.0 years (8.0 ttl pk-yrs)   Types: Cigarettes   Start date: 04/24/2010   Quit date: 04/24/2018   Years since quitting: 5.6  Smokeless Tobacco Former   Types: Chew   Quit date: 11/19/2013    Goals Met:  Independence with exercise equipment Exercise tolerated well No report of concerns or symptoms today Strength training completed today  Goals Unmet:  Not Applicable  Comments: Pt able to follow exercise prescription today without complaint.  Will continue to monitor for progression.    Dr. Oneil Pinal is Medical Director for [facility] Cardiac Rehabilitation.  Dr. Fuad Aleskerov is Medical Director for [facility] Pulmonary Rehabilitation.

## 2024-01-01 DIAGNOSIS — G4731 Primary central sleep apnea: Secondary | ICD-10-CM | POA: Diagnosis not present

## 2024-01-01 DIAGNOSIS — G4733 Obstructive sleep apnea (adult) (pediatric): Secondary | ICD-10-CM | POA: Diagnosis not present

## 2024-01-09 ENCOUNTER — Encounter: Payer: 59 | Admitting: *Deleted

## 2024-01-09 DIAGNOSIS — Z955 Presence of coronary angioplasty implant and graft: Secondary | ICD-10-CM

## 2024-01-09 DIAGNOSIS — I214 Non-ST elevation (NSTEMI) myocardial infarction: Secondary | ICD-10-CM

## 2024-01-09 NOTE — Progress Notes (Signed)
 Cardiac Individual Treatment Plan  Patient Details  Name: Kevin Reid MRN: [id number] Date of Birth: 1960/12/30 Referring Provider:   Flowsheet Row Cardiac Rehab from 11/28/2023 in [facility] Cardiac and Pulmonary Rehab  Referring Provider Dr. Burney Carter       Initial Encounter Date:  Flowsheet Row Cardiac Rehab from 11/28/2023 in [facility] Cardiac and Pulmonary Rehab  Date 11/28/23       Visit Diagnosis: NSTEMI (non-ST elevated myocardial infarction) [facility])  Status post coronary artery stent placement  Patient's Home Medications on Admission:  Current Outpatient Medications:    amLODipine  (NORVASC ) 10 MG tablet, Take 1 tablet (10 mg total) by mouth daily., Disp: 30 tablet, Rfl: 0   aspirin  81 MG chewable tablet, Chew 1 tablet (81 mg total) by mouth daily., Disp: 30 tablet, Rfl: 0   atorvastatin  (LIPITOR ) 80 MG tablet, Take 1 tablet (80 mg total) by mouth daily., Disp: 30 tablet, Rfl: 0   bisoprolol  (ZEBETA ) 5 MG tablet, Take 0.5 tablets (2.5 mg total) by mouth daily., Disp: 30 tablet, Rfl: 0   Blood Glucose Monitoring Suppl (BLOOD GLUCOSE MONITOR SYSTEM) w/Device KIT, Use to test blood sugar once daily, Disp: 1 kit, Rfl: 0   fenofibrate  (TRICOR ) 145 MG tablet, Take 1 tablet (145 mg total) by mouth daily., Disp: 90 tablet, Rfl: 3   glucose blood (CONTOUR NEXT TEST) test strip, Check sugar once daily. DX E11.9, Disp: 100 each, Rfl: 12   hydrochlorothiazide  (HYDRODIURIL ) 25 MG tablet, Take 25 mg by mouth daily., Disp: , Rfl:    Lancets MISC, 1 each by Does not apply route daily as needed., Disp: 200 each, Rfl: 1   levocetirizine (XYZAL ) 5 MG tablet, Take 5 mg by mouth every evening. (Patient not taking: Reported on 11/19/2023), Disp: , Rfl:    metFORMIN  (GLUCOPHAGE ) 1000 MG tablet, Take 1 tablet (1,000 mg total) by mouth 2 (two) times daily., Disp: 180 tablet, Rfl: 1   MOUNJARO 7.5 MG/0.5ML Pen, Inject 7.5 mg into the skin once a week. Sunday, Disp: , Rfl:    NON FORMULARY, CPAP MACHINE  USING EVERY NIGHT setting at 10, Disp: , Rfl:    omeprazole (PRILOSEC) 40 MG capsule, Take by mouth. (Patient not taking: Reported on 11/19/2023), Disp: , Rfl:    PARoxetine  (PAXIL ) 20 MG tablet, TAKE 3 TABLETS BY MOUTH ONCE DAILY (Patient taking differently: Take 60 mg by mouth daily.), Disp: 90 tablet, Rfl: 0   telmisartan (MICARDIS) 80 MG tablet, Take 80 mg by mouth daily., Disp: , Rfl:    ticagrelor  (BRILINTA ) 90 MG TABS tablet, Take 1 tablet (90 mg total) by mouth 2 (two) times daily., Disp: 60 tablet, Rfl: 0  Past Medical History: Past Medical History:  Diagnosis Date   Allergy    Depression    Diabetes mellitus without complication (HCC)    GERD (gastroesophageal reflux disease)    Heart murmur    Hyperlipidemia    Hypertension    Joint pain    Sleep apnea     Tobacco Use: Social History   Tobacco Use  Smoking Status Former   Current packs/day: 0.00   Average packs/day: 1 pack/day for 8.0 years (8.0 ttl pk-yrs)   Types: Cigarettes   Start date: 04/24/2010   Quit date: 04/24/2018   Years since quitting: 5.7  Smokeless Tobacco Former   Types: Chew   Quit date: 11/19/2013    Labs: Review Flowsheet  More data exists      Latest Ref Rng & Units 01/26/2020 02/17/2020 12/29/2020  11/02/2023 11/03/2023  Labs for ITP Cardiac and Pulmonary Rehab  Cholestrol 0 - 200 mg/dL 409  811  - - 914   LDL (calc) 0 - 99 mg/dL - 83  - - 74   Direct LDL mg/dL 78.2  - - - -  HDL-C >95 mg/dL 62.13  29  - - 31   Trlycerides <150 mg/dL 086.5 Triglyceride is over 400; calculations on Lipids are invalid.  367  209  - 176   Hemoglobin A1c 4.8 - 5.6 % - 7.0  7.2  5.4  -     Exercise Target Goals: Exercise Program Goal: Individual exercise prescription set using results from initial 6 min walk test and THRR while considering  patient's activity barriers and safety.   Exercise Prescription Goal: Initial exercise prescription builds to 30-45 minutes a day of aerobic activity, 2-3 days per week.  Home  exercise guidelines will be given to patient during program as part of exercise prescription that the participant will acknowledge.   Education: Aerobic Exercise: - Group verbal and visual presentation on the components of exercise prescription. Introduces F.I.T.T principle from ACSM for exercise prescriptions.  Reviews F.I.T.T. principles of aerobic exercise including progression. Written material given at graduation.   Education: Resistance Exercise: - Group verbal and visual presentation on the components of exercise prescription. Introduces F.I.T.T principle from ACSM for exercise prescriptions  Reviews F.I.T.T. principles of resistance exercise including progression. Written material given at graduation.    Education: Exercise & Equipment Safety: - Individual verbal instruction and demonstration of equipment use and safety with use of the equipment. Flowsheet Row Cardiac Rehab from 11/28/2023 in [facility] Cardiac and Pulmonary Rehab  Date 11/28/23  Educator [facility]  Instruction Review Code 1- Verbalizes Understanding       Education: Exercise Physiology & General Exercise Guidelines: - Group verbal and written instruction with models to review the exercise physiology of the cardiovascular system and associated critical values. Provides general exercise guidelines with specific guidelines to those with heart or lung disease.    Education: Flexibility, Balance, Mind/Body Relaxation: - Group verbal and visual presentation with interactive activity on the components of exercise prescription. Introduces F.I.T.T principle from ACSM for exercise prescriptions. Reviews F.I.T.T. principles of flexibility and balance exercise training including progression. Also discusses the mind body connection.  Reviews various relaxation techniques to help reduce and manage stress (i.e. Deep breathing, progressive muscle relaxation, and visualization). Balance handout provided to take home. Written material given at  graduation.   Activity Barriers & Risk Stratification:  Activity Barriers & Cardiac Risk Stratification - 11/28/23 1232       Activity Barriers & Cardiac Risk Stratification   Activity Barriers Joint Problems    Cardiac Risk Stratification High             6 Minute Walk:  6 Minute Walk     Row Name 11/28/23 1231         6 Minute Walk   Phase Initial     Distance 1350 feet     Walk Time 6 minutes     MPH 2.56     METS 3.17     RPE 9     Perceived Dyspnea  0     VO2 Peak 11.1     Symptoms No     Resting HR 52 bpm     Resting BP 138/82     Resting Oxygen Saturation  97 %     Exercise Oxygen Saturation  during 6 min  walk 97 %     Max Ex. HR 71 bpm     Max Ex. BP 154/82     2 Minute Post BP 140/80              Oxygen Initial Assessment:   Oxygen Re-Evaluation:   Oxygen Discharge (Final Oxygen Re-Evaluation):   Initial Exercise Prescription:  Initial Exercise Prescription - 11/28/23 1200       Date of Initial Exercise RX and Referring Provider   Date 11/28/23    Referring Provider Dr. Burney Carter      Oxygen   Maintain Oxygen Saturation 88% or higher      Treadmill   MPH 2.6    Grade 1    Minutes 15    METs 3.35      Recumbant Bike   Level 3    RPM 50    Watts 20    Minutes 15    METs 3.17      NuStep   Level 3   T4 and T6   SPM 80    Minutes 15    METs 3.17      Elliptical   Level 1    Speed 3    Minutes 15    METs 3.17      Biostep-RELP   Level 3    SPM 50    Minutes 15    METs 3.17      Prescription Details   Frequency (times per week) 3    Duration Progress to 30 minutes of continuous aerobic without signs/symptoms of physical distress      Intensity   THRR 40-80% of Max Heartrate 94-138    Ratings of Perceived Exertion 11-13    Perceived Dyspnea 0-4      Progression   Progression Continue to progress workloads to maintain intensity without signs/symptoms of physical distress.      Resistance Training    Training Prescription Yes    Weight 4    Reps 10-15             Perform Capillary Blood Glucose checks as needed.  Exercise Prescription Changes:   Exercise Prescription Changes     Row Name 11/28/23 1200 12/11/23 1500 12/25/23 1400         Response to Exercise   Blood Pressure (Admit) 138/82 148/80 140/70     Blood Pressure (Exercise) 154/82 150/88 150/80     Blood Pressure (Exit) 140/80 146/80 138/78     Heart Rate (Admit) 52 bpm 57 bpm 68 bpm     Heart Rate (Exercise) 71 bpm 86 bpm 106 bpm     Heart Rate (Exit) 53 bpm 61 bpm 61 bpm     Oxygen Saturation (Admit) 97 % -- --     Oxygen Saturation (Exercise) 97 % -- --     Oxygen Saturation (Exit) 98 % -- --     Rating of Perceived Exertion (Exercise) 9 13 12      Perceived Dyspnea (Exercise) 0 -- --     Symptoms none none none     Comments 6 MWT results First three days of exercise --     Duration -- Progress to 30 minutes of  aerobic without signs/symptoms of physical distress Continue with 30 min of aerobic exercise without signs/symptoms of physical distress.     Intensity -- THRR unchanged THRR unchanged       Progression   Progression -- Continue to progress workloads to maintain intensity without signs/symptoms  of physical distress. Continue to progress workloads to maintain intensity without signs/symptoms of physical distress.     Average METs -- 3.01 3.23       Resistance Training   Training Prescription -- Yes Yes     Weight -- 4 lb 4 lb     Reps -- 10-15 10-15       Interval Training   Interval Training -- No No       Treadmill   MPH -- 2.6 2.6     Grade -- 1 1     Minutes -- 15 15     METs -- 3.35 3.35       Elliptical   Level -- 1 1     Speed -- 3 2.5     Minutes -- 15 15     METs -- -- 3.7       Biostep-RELP   Level -- 2 --     Minutes -- 15 --     METs -- 2 --       Oxygen   Maintain Oxygen Saturation -- 88% or higher 88% or higher              Exercise Comments:   Exercise  Comments     Row Name 12/03/23 1647           Exercise Comments First full day of exercise!  Patient was oriented to gym and equipment including functions, settings, policies, and procedures.  Patient's individual exercise prescription and treatment plan were reviewed.  All starting workloads were established based on the results of the 6 minute walk test done at initial orientation visit.  The plan for exercise progression was also introduced and progression will be customized based on patient's performance and goals.                Exercise Goals and Review:   Exercise Goals     Row Name 11/28/23 1236             Exercise Goals   Increase Physical Activity Yes       Intervention Provide advice, education, support and counseling about physical activity/exercise needs.;Develop an individualized exercise prescription for aerobic and resistive training based on initial evaluation findings, risk stratification, comorbidities and participant's personal goals.       Expected Outcomes Short Term: Attend rehab on a regular basis to increase amount of physical activity.;Long Term: Add in home exercise to make exercise part of routine and to increase amount of physical activity.;Long Term: Exercising regularly at least 3-5 days a week.       Increase Strength and Stamina Yes       Intervention Provide advice, education, support and counseling about physical activity/exercise needs.;Develop an individualized exercise prescription for aerobic and resistive training based on initial evaluation findings, risk stratification, comorbidities and participant's personal goals.       Expected Outcomes Short Term: Increase workloads from initial exercise prescription for resistance, speed, and METs.;Short Term: Perform resistance training exercises routinely during rehab and add in resistance training at home;Long Term: Improve cardiorespiratory fitness, muscular endurance and strength as measured by  increased METs and functional capacity ( )       Able to understand and use rate of perceived exertion (RPE) scale Yes       Intervention Provide education and explanation on how to use RPE scale       Expected Outcomes Short Term: Able to use RPE daily in rehab to express subjective  intensity level;Long Term:  Able to use RPE to guide intensity level when exercising independently       Able to understand and use Dyspnea scale Yes       Intervention Provide education and explanation on how to use Dyspnea scale       Expected Outcomes Short Term: Able to use Dyspnea scale daily in rehab to express subjective sense of shortness of breath during exertion;Long Term: Able to use Dyspnea scale to guide intensity level when exercising independently       Knowledge and understanding of Target Heart Rate Range (THRR) Yes       Intervention Provide education and explanation of THRR including how the numbers were predicted and where they are located for reference       Expected Outcomes Short Term: Able to state/look up THRR;Long Term: Able to use THRR to govern intensity when exercising independently;Short Term: Able to use daily as guideline for intensity in rehab       Able to check pulse independently Yes       Intervention Provide education and demonstration on how to check pulse in carotid and radial arteries.;Review the importance of being able to check your own pulse for safety during independent exercise       Expected Outcomes Short Term: Able to explain why pulse checking is important during independent exercise;Long Term: Able to check pulse independently and accurately       Understanding of Exercise Prescription Yes       Intervention Provide education, explanation, and written materials on patient's individual exercise prescription       Expected Outcomes Short Term: Able to explain program exercise prescription;Long Term: Able to explain home exercise prescription to exercise independently                 Exercise Goals Re-Evaluation :  Exercise Goals Re-Evaluation     Row Name 12/03/23 1647 12/11/23 1527 12/24/23 1539 12/25/23 1413       Exercise Goal Re-Evaluation   Exercise Goals Review Increase Physical Activity;Able to understand and use rate of perceived exertion (RPE) scale;Knowledge and understanding of Target Heart Rate Range (THRR);Understanding of Exercise Prescription;Increase Strength and Stamina;Able to check pulse independently Increase Physical Activity;Increase Strength and Stamina;Understanding of Exercise Prescription Increase Physical Activity;Increase Strength and Stamina;Understanding of Exercise Prescription Increase Physical Activity;Increase Strength and Stamina;Understanding of Exercise Prescription    Comments Reviewed RPE and dyspnea scale, THR and program prescription with pt today.  Pt voiced understanding and was given a copy of goals to take home. Kevin Reid is off to a good start in the program. He has done well on the treadmill at a speed of 2.6 mph with an incline of 1%. He also has tolerated the elliptical at level 1 and the biostep at level 2. He used 4 lb hand weights for resistance training as well. We will continue to monitor his progress in the program. Kevin Reid is doing well with exercise here at rehab, working on increasing eliptical to level 3 today. He reports he hasnt been working out home, but wants to set new years resulotion to begin walking some at home. Encouraged him to stay active increase workload here at rehab as able. Kevin Reid is doing well in the program. He continues to walk the treadmill at a speed of 2.6 mph with an incline of 1%. He also has stayed consistent at level 1 on the elliptical. We will contineu to monitor his progress in the program.  Expected Outcomes Short: Use RPE daily to regulate intensity.  Long: Follow program prescription in THR. Short: Continue to follow current exercise prescription. Long: Continue exercise to improve  strength and stamina. STG: continue to follow current exercise prescription and begin walking at home. LTG: exercise independently to improve strength and stamina Short: Begin to progressively increase treadmill workload. Long: Continue exercise to improve strength and stamina.             Discharge Exercise Prescription (Final Exercise Prescription Changes):  Exercise Prescription Changes - 12/25/23 1400       Response to Exercise   Blood Pressure (Admit) 140/70    Blood Pressure (Exercise) 150/80    Blood Pressure (Exit) 138/78    Heart Rate (Admit) 68 bpm    Heart Rate (Exercise) 106 bpm    Heart Rate (Exit) 61 bpm    Rating of Perceived Exertion (Exercise) 12    Symptoms none    Duration Continue with 30 min of aerobic exercise without signs/symptoms of physical distress.    Intensity THRR unchanged      Progression   Progression Continue to progress workloads to maintain intensity without signs/symptoms of physical distress.    Average METs 3.23      Resistance Training   Training Prescription Yes    Weight 4 lb    Reps 10-15      Interval Training   Interval Training No      Treadmill   MPH 2.6    Grade 1    Minutes 15    METs 3.35      Elliptical   Level 1    Speed 2.5    Minutes 15    METs 3.7      Oxygen   Maintain Oxygen Saturation 88% or higher             Nutrition:  Target Goals: Understanding of nutrition guidelines, daily intake of sodium 1500mg , cholesterol 200mg , calories 30% from fat and 7% or less from saturated fats, daily to have 5 or more servings of fruits and vegetables.  Education: All About Nutrition: -Group instruction provided by verbal, written material, interactive activities, discussions, models, and posters to present general guidelines for heart healthy nutrition including fat, fiber, MyPlate, the role of sodium in heart healthy nutrition, utilization of the nutrition label, and utilization of this knowledge for meal  planning. Follow up email sent as well. Written material given at graduation.   Biometrics:  Pre Biometrics - 11/28/23 1237       Pre Biometrics   Height 5\' 11"  (1.803 m)    Weight 220 lb 4.8 oz (99.9 kg)    Waist Circumference 44 inches    Hip Circumference 42 inches    Waist to Hip Ratio 1.05 %    BMI (Calculated) 30.74    Single Leg Stand 30 seconds              Nutrition Therapy Plan and Nutrition Goals:  Nutrition Therapy & Goals - 11/28/23 1345       Nutrition Therapy   Diet Carc controlled, cardiac, low Na    Protein (specify units) 90    Fiber 30 grams    Whole Grain Foods 3 servings    Saturated Fats 15 max. grams    Fruits and Vegetables 5 servings/day    Sodium 2 grams      Personal Nutrition Goals   Nutrition Goal Read labels and reduce sodium intake to below 2300mg . Ideally 1500mg   per day.    Personal Goal #2 Reduce saturated fat, less than 12g per day. Replace bad fats for more heart healthy fats.    Personal Goal #3 Eat 15-30gProtein and 30-60gCarbs at each meal.    Comments Patient drinking mostly diet dr pepper. Switched from regularly sodas to diet, commended him on the change and encouraged him to continue to choose non sugary beverages. His A1C appears to have improved drastically from ~7% to 5.4%. He has never counted carbs, but tried to limit starchy foods. Spoke with him about quality of carbs, encouraging more whole grains and natural foods rather than refined carb choices. He likes whole grain breads and brown rice. Also discussed carb quantity and target of ~30-60g per meal. Reviewed Mediterranean diet handout, educated on types of fats, sources, and how to read label. Went over several of his favorite foods facts label and recommended changes when needed. Set goal to read labels and watch sodium more carefully. Built out several meals and snacks with foods he likes and will eat focusing on less sodium and saturated fat with more balance between  complex carbs and healthy protein or fat      Intervention Plan   Intervention Prescribe, educate and counsel regarding individualized specific dietary modifications aiming towards targeted core components such as weight, hypertension, lipid management, diabetes, heart failure and other comorbidities.;Nutrition handout(s) given to patient.    Expected Outcomes Short Term Goal: Understand basic principles of dietary content, such as calories, fat, sodium, cholesterol and nutrients.;Short Term Goal: A plan has been developed with personal nutrition goals set during dietitian appointment.;Long Term Goal: Adherence to prescribed nutrition plan.             Nutrition Assessments:  MEDIFICTS Score Key: >=70 Need to make dietary changes  40-70 Heart Healthy Diet <= 40 Therapeutic Level Cholesterol Diet  Flowsheet Row Cardiac Rehab from 11/28/2023 in [facility] Cardiac and Pulmonary Rehab  Picture Your Plate Total Score on Admission 65      Picture Your Plate Scores: <40 Unhealthy dietary pattern with much room for improvement. 41-50 Dietary pattern unlikely to meet recommendations for good health and room for improvement. 51-60 More healthful dietary pattern, with some room for improvement.  >60 Healthy dietary pattern, although there may be some specific behaviors that could be improved.    Nutrition Goals Re-Evaluation:  Nutrition Goals Re-Evaluation     Row Name 12/24/23 1544             Goals   Comment He is looking forward to 2025, feels the holidays were tough for him to follow good nutrtion goals. Says he had lots of comfort holiday food in larger portion. He is happy that he has cut back on sugary beverages, but feels he hasnt done well with replacing the sugary beverages with water. Set goal to drink more water in place of sugary beverages.       Expected Outcome STG: practice portion control, choose low calorie dense foods, drink plenty of water. LTG: Follow heart healthy  lifestyle and maintain healthy weight                Nutrition Goals Discharge (Final Nutrition Goals Re-Evaluation):  Nutrition Goals Re-Evaluation - 12/24/23 1544       Goals   Comment He is looking forward to 2025, feels the holidays were tough for him to follow good nutrtion goals. Says he had lots of comfort holiday food in larger portion. He is happy that he has  cut back on sugary beverages, but feels he hasnt done well with replacing the sugary beverages with water. Set goal to drink more water in place of sugary beverages.    Expected Outcome STG: practice portion control, choose low calorie dense foods, drink plenty of water. LTG: Follow heart healthy lifestyle and maintain healthy weight             Psychosocial: Target Goals: Acknowledge presence or absence of significant depression and/or stress, maximize coping skills, provide positive support system. Participant is able to verbalize types and ability to use techniques and skills needed for reducing stress and depression.   Education: Stress, Anxiety, and Depression - Group verbal and visual presentation to define topics covered.  Reviews how body is impacted by stress, anxiety, and depression.  Also discusses healthy ways to reduce stress and to treat/manage anxiety and depression.  Written material given at graduation. Flowsheet Row Cardiac Rehab from 11/28/2023 in [facility] Cardiac and Pulmonary Rehab  Education need identified 11/28/23       Education: Sleep Hygiene -Provides group verbal and written instruction about how sleep can affect your health.  Define sleep hygiene, discuss sleep cycles and impact of sleep habits. Review good sleep hygiene tips.    Initial Review & Psychosocial Screening:  Initial Psych Review & Screening - 11/27/23 1410       Initial Review   Current issues with History of Depression      Family Dynamics   Good Support System? Yes   wife, family     Barriers   Psychosocial barriers  to participate in program There are no identifiable barriers or psychosocial needs.;The patient should benefit from training in stress management and relaxation.      Screening Interventions   Interventions Encouraged to exercise;Provide feedback about the scores to participant;To provide support and resources with identified psychosocial needs    Expected Outcomes Short Term goal: Utilizing psychosocial counselor, staff and physician to assist with identification of specific Stressors or current issues interfering with healing process. Setting desired goal for each stressor or current issue identified.;Long Term Goal: Stressors or current issues are controlled or eliminated.;Short Term goal: Identification and review with participant of any Quality of Life or Depression concerns found by scoring the questionnaire.;Long Term goal: The participant improves quality of Life and PHQ9 Scores as seen by post scores and/or verbalization of changes             Quality of Life Scores:   Quality of Life - 11/28/23 1237       Quality of Life   Select Quality of Life      Quality of Life Scores   Health/Function Pre 25.29 %    Socioeconomic Pre 26.5 %    Psych/Spiritual Pre 28.29 %    Family Pre 28 %    GLOBAL Pre 26.5 %            Scores of 19 and below usually indicate a poorer quality of life in these areas.  A difference of  2-3 points is a clinically meaningful difference.  A difference of 2-3 points in the total score of the Quality of Life Index has been associated with significant improvement in overall quality of life, self-image, physical symptoms, and general health in studies assessing change in quality of life.  PHQ-9: Review Flowsheet       11/28/2023 10/18/2020 02/17/2020 11/19/2018  Depression screen PHQ 2/9  Decreased Interest 1 0 1 0 0  Down, Depressed, Hopeless 0 -  0 1 1  PHQ - 2 Score 1 0 1 1 1   Altered sleeping 1 - 0 3  Tired, decreased energy 2 - 2 1  Change in  appetite 1 - 0 0  Feeling bad or failure about yourself  0 - 0 0  Trouble concentrating 1 - 0 1  Moving slowly or fidgety/restless 0 - 0 0  Suicidal thoughts 0 - 0 0  PHQ-9 Score 6 - 3 6  Difficult doing work/chores Not difficult at all - Not difficult at all Not difficult at all    Details       Multiple values from one day are sorted in reverse-chronological order        Interpretation of Total Score  Total Score Depression Severity:  1-4 = Minimal depression, 5-9 = Mild depression, 10-14 = Moderate depression, 15-19 = Moderately severe depression, 20-27 = Severe depression   Psychosocial Evaluation and Intervention:  Psychosocial Evaluation - 11/27/23 1425       Psychosocial Evaluation & Interventions   Interventions Encouraged to exercise with the program and follow exercise prescription    Comments Kevin Reid is coming to cardiac rehab after a NSTEMI and stent. He states he is feeling well and just returned to work last week at the [employer]. He has a history of depression which is managed at this time with no concerns. He does report sleep concerns. He falls asleep okay, but then wakes in a few hours. We discussed asking his doctor about medication and proper sleep hygiene. He has a supportive wife and family. He is looking forward to attending the program to work on his stamina and strength    Expected Outcomes Short: attend cardiac rehab for education and exercise. Long: develop and maintain positive self care habits    Continue Psychosocial Services  Follow up required by staff             Psychosocial Re-Evaluation:  Psychosocial Re-Evaluation     Row Name 12/24/23 1542             Psychosocial Re-Evaluation   Current issues with Current Stress Concerns       Comments He is doing well with sleep the first 4hrs, then wakes up and struggles to sleep the last few hours. Reports he feels well rested when starting his days. He goes to sleep early to make sure he gets  good rest. He reports no stressors, anxiety or depression       Expected Outcomes STG: go to bed early,, ensure good rest. LTG: maintain positive outlook on health outcomes and daily life       Interventions Encouraged to attend Cardiac Rehabilitation for the exercise       Continue Psychosocial Services  Follow up required by staff                Psychosocial Discharge (Final Psychosocial Re-Evaluation):  Psychosocial Re-Evaluation - 12/24/23 1542       Psychosocial Re-Evaluation   Current issues with Current Stress Concerns    Comments He is doing well with sleep the first 4hrs, then wakes up and struggles to sleep the last few hours. Reports he feels well rested when starting his days. He goes to sleep early to make sure he gets good rest. He reports no stressors, anxiety or depression    Expected Outcomes STG: go to bed early,, ensure good rest. LTG: maintain positive outlook on health outcomes and daily life    Interventions Encouraged to  attend Cardiac Rehabilitation for the exercise    Continue Psychosocial Services  Follow up required by staff             Vocational Rehabilitation: Provide vocational rehab assistance to qualifying candidates.   Vocational Rehab Evaluation & Intervention:  Vocational Rehab - 11/27/23 1409       Initial Vocational Rehab Evaluation & Intervention   Assessment shows need for Vocational Rehabilitation No             Education: Education Goals: Education classes will be provided on a variety of topics geared toward better understanding of heart health and risk factor modification. Participant will state understanding/return demonstration of topics presented as noted by education test scores.  Learning Barriers/Preferences:  Learning Barriers/Preferences - 11/27/23 1409       Learning Barriers/Preferences   Learning Barriers None    Learning Preferences None             General Cardiac Education Topics:  AED/CPR: -  Group verbal and written instruction with the use of models to demonstrate the basic use of the AED with the basic ABC's of resuscitation.   Anatomy and Cardiac Procedures: - Group verbal and visual presentation and models provide information about basic cardiac anatomy and function. Reviews the testing methods done to diagnose heart disease and the outcomes of the test results. Describes the treatment choices: Medical Management, Angioplasty, or Coronary Bypass Surgery for treating various heart conditions including Myocardial Infarction, Angina, Valve Disease, and Cardiac Arrhythmias.  Written material given at graduation.   Medication Safety: - Group verbal and visual instruction to review commonly prescribed medications for heart and lung disease. Reviews the medication, class of the drug, and side effects. Includes the steps to properly store meds and maintain the prescription regimen.  Written material given at graduation.   Intimacy: - Group verbal instruction through game format to discuss how heart and lung disease can affect sexual intimacy. Written material given at graduation..   Know Your Numbers and Heart Failure: - Group verbal and visual instruction to discuss disease risk factors for cardiac and pulmonary disease and treatment options.  Reviews associated critical values for Overweight/Obesity, Hypertension, Cholesterol, and Diabetes.  Discusses basics of heart failure: signs/symptoms and treatments.  Introduces Heart Failure Zone chart for action plan for heart failure.  Written material given at graduation.   Infection Prevention: - Provides verbal and written material to individual with discussion of infection control including proper hand washing and proper equipment cleaning during exercise session. Flowsheet Row Cardiac Rehab from 11/28/2023 in [facility] Cardiac and Pulmonary Rehab  Date 11/28/23  Educator [facility]  Instruction Review Code 1- Verbalizes Understanding        Falls Prevention: - Provides verbal and written material to individual with discussion of falls prevention and safety. Flowsheet Row Cardiac Rehab from 11/28/2023 in [facility] Cardiac and Pulmonary Rehab  Date 11/28/23  Educator [facility]  Instruction Review Code 1- Verbalizes Understanding       Other: -Provides group and verbal instruction on various topics (see comments)   Knowledge Questionnaire Score:  Knowledge Questionnaire Score - 11/28/23 1237       Knowledge Questionnaire Score   Pre Score 25/26             Core Components/Risk Factors/Patient Goals at Admission:  Personal Goals and Risk Factors at Admission - 11/28/23 1238       Core Components/Risk Factors/Patient Goals on Admission    Weight Management Yes    Intervention Weight  Management: Develop a combined nutrition and exercise program designed to reach desired caloric intake, while maintaining appropriate intake of nutrient and fiber, sodium and fats, and appropriate energy expenditure required for the weight goal.;Weight Management: Provide education and appropriate resources to help participant work on and attain dietary goals.;Weight Management/Obesity: Establish reasonable short term and long term weight goals.    Admit Weight 220 lb 4.8 oz (99.9 kg)    Goal Weight: Short Term 210 lb (95.3 kg)    Goal Weight: Long Term 190 lb (86.2 kg)    Expected Outcomes Short Term: Continue to assess and modify interventions until short term weight is achieved;Long Term: Adherence to nutrition and physical activity/exercise program aimed toward attainment of established weight goal;Weight Loss: Understanding of general recommendations for a balanced deficit meal plan, which promotes 1-2 lb weight loss per week and includes a negative energy balance of [phone] kcal/d;Understanding recommendations for meals to include 15-35% energy as protein, 25-35% energy from fat, 35-60% energy from carbohydrates, less than 200mg  of dietary  cholesterol, 20-35 gm of total fiber daily;Understanding of distribution of calorie intake throughout the day with the consumption of 4-5 meals/snacks    Diabetes Yes    Intervention Provide education about signs/symptoms and action to take for hypo/hyperglycemia.;Provide education about proper nutrition, including hydration, and aerobic/resistive exercise prescription along with prescribed medications to achieve blood glucose in normal ranges: Fasting glucose 65-99 mg/dL    Expected Outcomes Short Term: Participant verbalizes understanding of the signs/symptoms and immediate care of hyper/hypoglycemia, proper foot care and importance of medication, aerobic/resistive exercise and nutrition plan for blood glucose control.;Long Term: Attainment of HbA1C < 7%.    Hypertension Yes    Intervention Provide education on lifestyle modifcations including regular physical activity/exercise, weight management, moderate sodium restriction and increased consumption of fresh fruit, vegetables, and low fat dairy, alcohol moderation, and smoking cessation.;Monitor prescription use compliance.    Expected Outcomes Short Term: Continued assessment and intervention until BP is < 140/36mm HG in hypertensive participants. < 130/76mm HG in hypertensive participants with diabetes, heart failure or chronic kidney disease.;Long Term: Maintenance of blood pressure at goal levels.    Lipids Yes    Intervention Provide education and support for participant on nutrition & aerobic/resistive exercise along with prescribed medications to achieve LDL 70mg , HDL >40mg .    Expected Outcomes Short Term: Participant states understanding of desired cholesterol values and is compliant with medications prescribed. Participant is following exercise prescription and nutrition guidelines.;Long Term: Cholesterol controlled with medications as prescribed, with individualized exercise RX and with personalized nutrition plan. Value goals: LDL < 70mg ,  HDL > 40 mg.             Education:Diabetes - Individual verbal and written instruction to review signs/symptoms of diabetes, desired ranges of glucose level fasting, after meals and with exercise. Acknowledge that pre and post exercise glucose checks will be done for 3 sessions at entry of program. Flowsheet Row Cardiac Rehab from 11/28/2023 in [facility] Cardiac and Pulmonary Rehab  Date 11/28/23  Educator [facility]  Instruction Review Code 1- Verbalizes Understanding       Core Components/Risk Factors/Patient Goals Review:   Goals and Risk Factor Review     Row Name 12/24/23 1548             Core Components/Risk Factors/Patient Goals Review   Personal Goals Review Weight Management/Obesity       Review He is a little disappointed he put on ~5-8lbs over the holidays but is confident  he can lose that weight again once the new year starts and he is more ontop of his health goals. Spoke with him about cutting back on sugary beverages and including more low caloire foods with water and fiber.       Expected Outcomes Short: lose 5-10lbs and cut back on sugary beverages. LTG: maintain healthy weight and hydrate well independently                Core Components/Risk Factors/Patient Goals at Discharge (Final Review):   Goals and Risk Factor Review - 12/24/23 1548       Core Components/Risk Factors/Patient Goals Review   Personal Goals Review Weight Management/Obesity    Review He is a little disappointed he put on ~5-8lbs over the holidays but is confident he can lose that weight again once the new year starts and he is more ontop of his health goals. Spoke with him about cutting back on sugary beverages and including more low caloire foods with water and fiber.    Expected Outcomes Short: lose 5-10lbs and cut back on sugary beverages. LTG: maintain healthy weight and hydrate well independently             ITP Comments:  ITP Comments     Row Name 11/27/23 1423 11/28/23 1229  12/03/23 1647 12/12/23 1158 01/09/24 1317   ITP Comments Initial phone call completed. Diagnosis can be found in [facility] 11/8. EP Orientation scheduled for Wednesday 12/4 at 10:30. Completed and gym orientation. Initial ITP created and sent for review to Dr. Firman Hughes, Medical Director. First full day of exercise!  Patient was oriented to gym and equipment including functions, settings, policies, and procedures.  Patient's individual exercise prescription and treatment plan were reviewed.  All starting workloads were established based on the results of the 6 minute walk test done at initial orientation visit.  The plan for exercise progression was also introduced and progression will be customized based on patient's performance and goals. 30 Day review completed. Medical Director ITP review done, changes made as directed, and signed approval by Medical Director.    new to program 30 Day review completed. Medical Director ITP review done, changes made as directed, and signed approval by Medical Director.    new to program            Comments: 30 day review

## 2024-01-09 NOTE — Progress Notes (Signed)
 Daily Session Note  Patient Details  Name: Kevin Reid MRN: [id number] Date of Birth: 29-May-1961 Referring Provider:   Flowsheet Row Cardiac Rehab from 11/28/2023 in [facility] Cardiac and Pulmonary Rehab  Referring Provider Dr. Burney Carter       Encounter Date: 01/09/2024  Check In:  Session Check In - 01/09/24 1604       Check-In   Supervising physician immediately available to respond to emergencies See telemetry face sheet for immediately available ER MD    Location [facility]-Cardiac & Pulmonary Rehab    Staff Present Maxon Conetta BS, Exercise Physiologist;Laureen Bevin Bucks, BS, RRT, CPFT;Analyse Angst Manson Seitz RN,BSN;Joseph Hood RCP,RRT,BSRT    Virtual Visit No    Medication changes reported     No    Fall or balance concerns reported    No    Warm-up and Cool-down Performed on first and last piece of equipment    Resistance Training Performed Yes    VAD Patient? No    PAD/SET Patient? No      Pain Assessment   Currently in Pain? No/denies                Social History   Tobacco Use  Smoking Status Former   Current packs/day: 0.00   Average packs/day: 1 pack/day for 8.0 years (8.0 ttl pk-yrs)   Types: Cigarettes   Start date: 04/24/2010   Quit date: 04/24/2018   Years since quitting: 5.7  Smokeless Tobacco Former   Types: Chew   Quit date: 11/19/2013    Goals Met:  Independence with exercise equipment Exercise tolerated well No report of concerns or symptoms today Strength training completed today  Goals Unmet:  Not Applicable  Comments: Pt able to follow exercise prescription today without complaint.  Will continue to monitor for progression.    Dr. Firman Hughes is Medical Director for [facility] Cardiac Rehabilitation.  Dr. Fuad Aleskerov is Medical Director for [facility] Pulmonary Rehabilitation.

## 2024-01-10 ENCOUNTER — Encounter: Payer: 59 | Admitting: *Deleted

## 2024-01-10 DIAGNOSIS — I214 Non-ST elevation (NSTEMI) myocardial infarction: Secondary | ICD-10-CM | POA: Diagnosis not present

## 2024-01-10 DIAGNOSIS — Z955 Presence of coronary angioplasty implant and graft: Secondary | ICD-10-CM | POA: Diagnosis not present

## 2024-01-10 NOTE — Progress Notes (Signed)
Daily Session Note  Patient Details  Name: Kevin Reid MRN: [id number] Date of Birth: February 07, 1961 Referring Provider:   Flowsheet Row Cardiac Rehab from 11/28/2023 in [facility] Cardiac and Pulmonary Rehab  Referring Provider Dr. Dorothyann Peng       Encounter Date: 01/10/2024  Check In:  Session Check In - 01/10/24 1553       Check-In   Supervising physician immediately available to respond to emergencies See telemetry face sheet for immediately available ER MD    Location [facility]-Cardiac & Pulmonary Rehab    Staff Present Cora Collum, RN, BSN, CCRP;Meredith Jewel Baize RN,BSN;Joseph Hood RCP,RRT,BSRT;Noah Tickle, [place], Exercise Physiologist    Virtual Visit No    Medication changes reported     No    Fall or balance concerns reported    No    Warm-up and Cool-down Performed on first and last piece of equipment    Resistance Training Performed Yes    VAD Patient? No    PAD/SET Patient? No      Pain Assessment   Currently in Pain? No/denies                Social History   Tobacco Use  Smoking Status Former   Current packs/day: 0.00   Average packs/day: 1 pack/day for 8.0 years (8.0 ttl pk-yrs)   Types: Cigarettes   Start date: 04/24/2010   Quit date: 04/24/2018   Years since quitting: 5.7  Smokeless Tobacco Former   Types: Chew   Quit date: 11/19/2013    Goals Met:  Independence with exercise equipment Exercise tolerated well No report of concerns or symptoms today  Goals Unmet:  Not Applicable  Comments: Pt able to follow exercise prescription today without complaint.  Will continue to monitor for progression.    Dr. Bethann Punches is Medical Director for [facility] Cardiac Rehabilitation.  Dr. Vida Rigger is Medical Director for [facility] Pulmonary Rehabilitation.

## 2024-01-16 ENCOUNTER — Encounter: Payer: 59 | Admitting: *Deleted

## 2024-01-16 DIAGNOSIS — Z955 Presence of coronary angioplasty implant and graft: Secondary | ICD-10-CM | POA: Diagnosis not present

## 2024-01-16 DIAGNOSIS — I214 Non-ST elevation (NSTEMI) myocardial infarction: Secondary | ICD-10-CM | POA: Diagnosis not present

## 2024-01-16 NOTE — Progress Notes (Signed)
Daily Session Note  Patient Details  Name: Kevin Reid MRN: [id number] Date of Birth: [birth year] Referring Provider:   Flowsheet Row Cardiac Rehab from 11/28/2023 in [facility] Cardiac and Pulmonary Rehab  Referring Provider Dr. Dorothyann Peng       Encounter Date: 01/16/2024  Check In:  Session Check In - 01/16/24 1533       Check-In   Supervising physician immediately available to respond to emergencies See telemetry face sheet for immediately available ER MD    Location [facility]-Cardiac & Pulmonary Rehab    Staff Present Maxon Conetta BS, Exercise Physiologist;Kelly Cloretta Ned, ACSM CEP, Exercise Physiologist;Dionis Autry Jewel Baize RN,BSN    Virtual Visit No    Medication changes reported     No    Fall or balance concerns reported    No    Warm-up and Cool-down Performed on first and last piece of equipment    Resistance Training Performed Yes    VAD Patient? No    PAD/SET Patient? No      Pain Assessment   Currently in Pain? No/denies                Social History   Tobacco Use  Smoking Status Former   Current packs/day: 0.00   Average packs/day: 1 pack/day for 8.0 years (8.0 ttl pk-yrs)   Types: Cigarettes   Start date: 04/24/2010   Quit date: 04/24/2018   Years since quitting: 5.7  Smokeless Tobacco Former   Types: Chew   Quit date: 11/19/2013    Goals Met:  Independence with exercise equipment Exercise tolerated well Personal goals reviewed No report of concerns or symptoms today Strength training completed today  Goals Unmet:  Not Applicable  Comments: Pt able to follow exercise prescription today without complaint.  Will continue to monitor for progression.   Reviewed home exercise with pt today.  Pt plans to walk at home for exercise.  Reviewed THR, pulse, RPE, sign and symptoms, pulse oximetery and when to call 911 or MD.  Also discussed weather considerations and indoor options.  Pt voiced understanding.   Dr. Bethann Punches is Medical Director for  [facility] Cardiac Rehabilitation.  Dr. Vida Rigger is Medical Director for [facility] Pulmonary Rehabilitation.

## 2024-01-21 ENCOUNTER — Encounter: Payer: 59 | Admitting: *Deleted

## 2024-01-21 DIAGNOSIS — I214 Non-ST elevation (NSTEMI) myocardial infarction: Secondary | ICD-10-CM | POA: Diagnosis not present

## 2024-01-21 DIAGNOSIS — Z955 Presence of coronary angioplasty implant and graft: Secondary | ICD-10-CM

## 2024-01-21 NOTE — Progress Notes (Signed)
Daily Session Note  Patient Details  Name: Kevin Reid MRN: [id number] Date of Birth: [birth year] Referring Provider:   Flowsheet Row Cardiac Rehab from 11/28/2023 in [facility] Cardiac and Pulmonary Rehab  Referring Provider Dr. Dorothyann Peng       Encounter Date: 01/21/2024  Check In:  Session Check In - 01/21/24 1543       Check-In   Supervising physician immediately available to respond to emergencies See telemetry face sheet for immediately available ER MD    Location [facility]-Cardiac & Pulmonary Rehab    Staff Present Maxon Conetta BS, Exercise Physiologist;Noah Tickle, BS, Exercise Physiologist;Cheryal Salas Jewel Baize RN,BSN;Joseph Hood RCP,RRT,BSRT    Virtual Visit No    Medication changes reported     No    Fall or balance concerns reported    No    Warm-up and Cool-down Performed on first and last piece of equipment    Resistance Training Performed Yes    VAD Patient? No    PAD/SET Patient? No      Pain Assessment   Currently in Pain? No/denies                Social History   Tobacco Use  Smoking Status Former   Current packs/day: 0.00   Average packs/day: 1 pack/day for 8.0 years (8.0 ttl pk-yrs)   Types: Cigarettes   Start date: 04/24/2010   Quit date: 04/24/2018   Years since quitting: 5.7  Smokeless Tobacco Former   Types: Chew   Quit date: 11/19/2013    Goals Met:  Independence with exercise equipment Exercise tolerated well No report of concerns or symptoms today Strength training completed today  Goals Unmet:  Not Applicable  Comments: Pt able to follow exercise prescription today without complaint.  Will continue to monitor for progression.    Dr. Bethann Punches is Medical Director for [facility] Cardiac Rehabilitation.  Dr. Vida Rigger is Medical Director for [facility] Pulmonary Rehabilitation.

## 2024-01-23 ENCOUNTER — Encounter: Payer: 59 | Admitting: *Deleted

## 2024-01-23 DIAGNOSIS — I214 Non-ST elevation (NSTEMI) myocardial infarction: Secondary | ICD-10-CM

## 2024-01-23 DIAGNOSIS — Z955 Presence of coronary angioplasty implant and graft: Secondary | ICD-10-CM | POA: Diagnosis not present

## 2024-01-23 NOTE — Progress Notes (Signed)
Daily Session Note  Patient Details  Name: Kevin Reid MRN: [id number] Date of Birth: 1961-07-29 Referring Provider:   Flowsheet Row Cardiac Rehab from 11/28/2023 in [facility] Cardiac and Pulmonary Rehab  Referring Provider Dr. Dorothyann Peng       Encounter Date: 01/23/2024  Check In:  Session Check In - 01/23/24 1558       Check-In   Supervising physician immediately available to respond to emergencies See telemetry face sheet for immediately available ER MD    Location [facility]-Cardiac & Pulmonary Rehab    Staff Present Maxon Conetta BS, Exercise Physiologist;Kelly Cloretta Ned, ACSM CEP, Exercise Physiologist;Nanda Bittick Jewel Baize RN,BSN;Joseph Hood RCP,RRT,BSRT    Virtual Visit No    Medication changes reported     No    Fall or balance concerns reported    No    Warm-up and Cool-down Performed on first and last piece of equipment    Resistance Training Performed Yes    VAD Patient? No    PAD/SET Patient? No      Pain Assessment   Currently in Pain? No/denies                Social History   Tobacco Use  Smoking Status Former   Current packs/day: 0.00   Average packs/day: 1 pack/day for 8.0 years (8.0 ttl pk-yrs)   Types: Cigarettes   Start date: 04/24/2010   Quit date: 04/24/2018   Years since quitting: 5.7  Smokeless Tobacco Former   Types: Chew   Quit date: 11/19/2013    Goals Met:  Independence with exercise equipment Exercise tolerated well No report of concerns or symptoms today Strength training completed today  Goals Unmet:  Not Applicable  Comments: Pt able to follow exercise prescription today without complaint.  Will continue to monitor for progression.    Dr. Bethann Punches is Medical Director for [facility] Cardiac Rehabilitation.  Dr. Vida Rigger is Medical Director for [facility] Pulmonary Rehabilitation.

## 2024-01-24 ENCOUNTER — Encounter: Payer: 59 | Admitting: *Deleted

## 2024-01-24 DIAGNOSIS — I214 Non-ST elevation (NSTEMI) myocardial infarction: Secondary | ICD-10-CM | POA: Diagnosis not present

## 2024-01-24 DIAGNOSIS — Z955 Presence of coronary angioplasty implant and graft: Secondary | ICD-10-CM

## 2024-01-24 NOTE — Progress Notes (Signed)
Daily Session Note  Patient Details  Name: Kevin Reid MRN: [id number] Date of Birth: Aug 18, 1961 Referring Provider:   Flowsheet Row Cardiac Rehab from 11/28/2023 in [facility] Cardiac and Pulmonary Rehab  Referring Provider Dr. Dorothyann Peng       Encounter Date: 01/24/2024  Check In:  Session Check In - 01/24/24 1552       Check-In   Supervising physician immediately available to respond to emergencies See telemetry face sheet for immediately available ER MD    Location [facility]-Cardiac & Pulmonary Rehab    Staff Present Maxon Suzzette Righter, Exercise Physiologist;Bernardo Brayman Jewel Baize RN,BSN;Joseph Hood RCP,RRT,BSRT;Noah Tickle, [place], Exercise Physiologist    Virtual Visit No    Medication changes reported     No    Fall or balance concerns reported    No    Warm-up and Cool-down Performed on first and last piece of equipment    Resistance Training Performed Yes    VAD Patient? No    PAD/SET Patient? No      Pain Assessment   Currently in Pain? No/denies                Social History   Tobacco Use  Smoking Status Former   Current packs/day: 0.00   Average packs/day: 1 pack/day for 8.0 years (8.0 ttl pk-yrs)   Types: Cigarettes   Start date: 04/24/2010   Quit date: 04/24/2018   Years since quitting: 5.7  Smokeless Tobacco Former   Types: Chew   Quit date: 11/19/2013    Goals Met:  Independence with exercise equipment Exercise tolerated well No report of concerns or symptoms today Strength training completed today  Goals Unmet:  Not Applicable  Comments: Pt able to follow exercise prescription today without complaint.  Will continue to monitor for progression.    Dr. Bethann Punches is Medical Director for [facility] Cardiac Rehabilitation.  Dr. Vida Rigger is Medical Director for [facility] Pulmonary Rehabilitation.

## 2024-01-28 ENCOUNTER — Encounter: Payer: 59 | Attending: Internal Medicine | Admitting: *Deleted

## 2024-01-28 DIAGNOSIS — Z955 Presence of coronary angioplasty implant and graft: Secondary | ICD-10-CM | POA: Diagnosis not present

## 2024-01-28 DIAGNOSIS — I252 Old myocardial infarction: Secondary | ICD-10-CM | POA: Diagnosis not present

## 2024-01-28 DIAGNOSIS — Z48812 Encounter for surgical aftercare following surgery on the circulatory system: Secondary | ICD-10-CM | POA: Insufficient documentation

## 2024-01-28 DIAGNOSIS — I214 Non-ST elevation (NSTEMI) myocardial infarction: Secondary | ICD-10-CM | POA: Diagnosis present

## 2024-01-28 NOTE — Progress Notes (Signed)
Daily Session Note  Patient Details  Name: Kevin Reid MRN: [id number] Date of Birth: July 16, 1961 Referring Provider:   Flowsheet Row Cardiac Rehab from 11/28/2023 in [facility] Cardiac and Pulmonary Rehab  Referring Provider Dr. Dorothyann Peng       Encounter Date: 01/28/2024  Check In:  Session Check In - 01/28/24 1550       Check-In   Supervising physician immediately available to respond to emergencies See telemetry face sheet for immediately available ER MD    Location [facility]-Cardiac & Pulmonary Rehab    Staff Present Maxon Conetta BS, Exercise Physiologist;Jaquanna Ballentine Jewel Baize RN,BSN;Joseph Hood RCP,RRT,BSRT    Virtual Visit No    Medication changes reported     No    Fall or balance concerns reported    No    Warm-up and Cool-down Performed on first and last piece of equipment    Resistance Training Performed Yes    VAD Patient? No    PAD/SET Patient? No      Pain Assessment   Currently in Pain? No/denies                Social History   Tobacco Use  Smoking Status Former   Current packs/day: 0.00   Average packs/day: 1 pack/day for 8.0 years (8.0 ttl pk-yrs)   Types: Cigarettes   Start date: 04/24/2010   Quit date: 04/24/2018   Years since quitting: 5.7  Smokeless Tobacco Former   Types: Chew   Quit date: 11/19/2013    Goals Met:  Independence with exercise equipment Exercise tolerated well No report of concerns or symptoms today Strength training completed today  Goals Unmet:  Not Applicable  Comments: Pt able to follow exercise prescription today without complaint.  Will continue to monitor for progression.    Dr. Bethann Punches is Medical Director for [facility] Cardiac Rehabilitation.  Dr. Vida Rigger is Medical Director for [facility] Pulmonary Rehabilitation.

## 2024-01-30 ENCOUNTER — Encounter: Payer: 59 | Admitting: *Deleted

## 2024-01-30 DIAGNOSIS — Z48812 Encounter for surgical aftercare following surgery on the circulatory system: Secondary | ICD-10-CM | POA: Diagnosis not present

## 2024-01-30 DIAGNOSIS — I214 Non-ST elevation (NSTEMI) myocardial infarction: Secondary | ICD-10-CM

## 2024-01-30 DIAGNOSIS — Z955 Presence of coronary angioplasty implant and graft: Secondary | ICD-10-CM | POA: Diagnosis not present

## 2024-01-30 DIAGNOSIS — I252 Old myocardial infarction: Secondary | ICD-10-CM | POA: Diagnosis not present

## 2024-01-30 NOTE — Progress Notes (Signed)
 Daily Session Note  Patient Details  Name: Kevin Reid MRN: [id number] Date of Birth: [birth year] Referring Provider:   Flowsheet Row Cardiac Rehab from 11/28/2023 in [facility] Cardiac and Pulmonary Rehab  Referring Provider Dr. Cara Lovelace       Encounter Date: 01/30/2024  Check In:  Session Check In - 01/30/24 1540       Check-In   Supervising physician immediately available to respond to emergencies See telemetry face sheet for immediately available ER MD    Location [facility]-Cardiac & Pulmonary Rehab    Staff Present Maxon Conetta BS, Exercise Physiologist;Kelly Dyane HECKLE, ACSM CEP, Exercise Physiologist;Glorene Leitzke Tressa RN,BSN;Joseph Hood RCP,RRT,BSRT    Virtual Visit No    Medication changes reported     No    Fall or balance concerns reported    No    Warm-up and Cool-down Performed on first and last piece of equipment    Resistance Training Performed Yes    VAD Patient? No    PAD/SET Patient? No      Pain Assessment   Currently in Pain? No/denies                Social History   Tobacco Use  Smoking Status Former   Current packs/day: 0.00   Average packs/day: 1 pack/day for 8.0 years (8.0 ttl pk-yrs)   Types: Cigarettes   Start date: 04/24/2010   Quit date: 04/24/2018   Years since quitting: 5.7  Smokeless Tobacco Former   Types: Chew   Quit date: 11/19/2013    Goals Met:  Independence with exercise equipment Exercise tolerated well No report of concerns or symptoms today Strength training completed today  Goals Unmet:  Not Applicable  Comments: Pt able to follow exercise prescription today without complaint.  Will continue to monitor for progression.    Dr. Oneil Pinal is Medical Director for [facility] Cardiac Rehabilitation.  Dr. Fuad Aleskerov is Medical Director for [facility] Pulmonary Rehabilitation.

## 2024-01-31 ENCOUNTER — Encounter: Payer: 59 | Admitting: *Deleted

## 2024-01-31 DIAGNOSIS — Z955 Presence of coronary angioplasty implant and graft: Secondary | ICD-10-CM | POA: Diagnosis not present

## 2024-01-31 DIAGNOSIS — Z48812 Encounter for surgical aftercare following surgery on the circulatory system: Secondary | ICD-10-CM | POA: Diagnosis not present

## 2024-01-31 DIAGNOSIS — I252 Old myocardial infarction: Secondary | ICD-10-CM | POA: Diagnosis not present

## 2024-01-31 DIAGNOSIS — I214 Non-ST elevation (NSTEMI) myocardial infarction: Secondary | ICD-10-CM

## 2024-01-31 NOTE — Progress Notes (Signed)
 Daily Session Note  Patient Details  Name: Kevin Reid MRN: [id number] Date of Birth: June 15, 1961 Referring Provider:   Flowsheet Row Cardiac Rehab from 11/28/2023 in [facility] Cardiac and Pulmonary Rehab  Referring Provider Dr. Cara Lovelace       Encounter Date: 01/31/2024  Check In:  Session Check In - 01/31/24 1557       Check-In   Supervising physician immediately available to respond to emergencies See telemetry face sheet for immediately available ER MD    Location [facility]-Cardiac & Pulmonary Rehab    Staff Present Maxon Conetta BS, Exercise Physiologist;Noah Tickle, BS, Exercise Physiologist;Denee Boeder Tressa RN,BSN;Joseph Hood RCP,RRT,BSRT    Virtual Visit No    Medication changes reported     No    Fall or balance concerns reported    No    Warm-up and Cool-down Performed on first and last piece of equipment    Resistance Training Performed Yes    VAD Patient? No    PAD/SET Patient? No      Pain Assessment   Currently in Pain? No/denies                Social History   Tobacco Use  Smoking Status Former   Current packs/day: 0.00   Average packs/day: 1 pack/day for 8.0 years (8.0 ttl pk-yrs)   Types: Cigarettes   Start date: 04/24/2010   Quit date: 04/24/2018   Years since quitting: 5.7  Smokeless Tobacco Former   Types: Chew   Quit date: 11/19/2013    Goals Met:  Independence with exercise equipment Exercise tolerated well No report of concerns or symptoms today Strength training completed today  Goals Unmet:  Not Applicable  Comments: Pt able to follow exercise prescription today without complaint.  Will continue to monitor for progression.    Dr. Oneil Pinal is Medical Director for [facility] Cardiac Rehabilitation.  Dr. Fuad Aleskerov is Medical Director for [facility] Pulmonary Rehabilitation.

## 2024-02-04 ENCOUNTER — Encounter: Payer: 59 | Admitting: *Deleted

## 2024-02-04 DIAGNOSIS — Z48812 Encounter for surgical aftercare following surgery on the circulatory system: Secondary | ICD-10-CM | POA: Diagnosis not present

## 2024-02-04 DIAGNOSIS — Z955 Presence of coronary angioplasty implant and graft: Secondary | ICD-10-CM | POA: Diagnosis not present

## 2024-02-04 DIAGNOSIS — I214 Non-ST elevation (NSTEMI) myocardial infarction: Secondary | ICD-10-CM

## 2024-02-04 DIAGNOSIS — I252 Old myocardial infarction: Secondary | ICD-10-CM | POA: Diagnosis not present

## 2024-02-04 NOTE — Progress Notes (Signed)
 Daily Session Note  Patient Details  Name: Kevin Reid MRN: [id number] Date of Birth: 1961-04-12 Referring Provider:   Flowsheet Row Cardiac Rehab from 11/28/2023 in [facility] Cardiac and Pulmonary Rehab  Referring Provider Dr. Burney Carter       Encounter Date: 02/04/2024  Check In:  Session Check In - 02/04/24 1550       Check-In   Supervising physician immediately available to respond to emergencies See telemetry face sheet for immediately available ER MD    Location [facility]-Cardiac & Pulmonary Rehab    Staff Present Maxon Conetta BS, Exercise Physiologist;Susanne Bice, RN, BSN, CCRP;Dejuan Elman Manson Seitz RN,BSN;[employer]    Virtual Visit No    Medication changes reported     No    Fall or balance concerns reported    No    Warm-up and Cool-down Performed on first and last piece of equipment    Resistance Training Performed Yes    VAD Patient? No    PAD/SET Patient? No      Pain Assessment   Currently in Pain? No/denies                Social History   Tobacco Use  Smoking Status Former   Current packs/day: 0.00   Average packs/day: 1 pack/day for 8.0 years (8.0 ttl pk-yrs)   Types: Cigarettes   Start date: 04/24/2010   Quit date: 04/24/2018   Years since quitting: 5.7  Smokeless Tobacco Former   Types: Chew   Quit date: 11/19/2013    Goals Met:  Independence with exercise equipment Exercise tolerated well No report of concerns or symptoms today Strength training completed today  Goals Unmet:  Not Applicable  Comments: Pt able to follow exercise prescription today without complaint.  Will continue to monitor for progression.    Dr. Firman Hughes is Medical Director for [facility] Cardiac Rehabilitation.  Dr. Fuad Aleskerov is Medical Director for [facility] Pulmonary Rehabilitation.

## 2024-02-06 DIAGNOSIS — I214 Non-ST elevation (NSTEMI) myocardial infarction: Secondary | ICD-10-CM

## 2024-02-06 DIAGNOSIS — Z955 Presence of coronary angioplasty implant and graft: Secondary | ICD-10-CM

## 2024-02-06 NOTE — Progress Notes (Signed)
Cardiac Individual Treatment Plan  Patient Details  Name: Kevin Reid MRN: [id number] Date of Birth: 07/16/1961 Referring Provider:   Flowsheet Row Cardiac Rehab from 11/28/2023 in [facility] Cardiac and Pulmonary Rehab  Referring Provider Dr. Dorothyann Peng       Initial Encounter Date:  Flowsheet Row Cardiac Rehab from 11/28/2023 in [facility] Cardiac and Pulmonary Rehab  Date 11/28/23       Visit Diagnosis: NSTEMI (non-ST elevated myocardial infarction) [facility])  Status post coronary artery stent placement  Patient's Home Medications on Admission:  Current Outpatient Medications:    amLODipine (NORVASC) 10 MG tablet, Take 1 tablet (10 mg total) by mouth daily., Disp: 30 tablet, Rfl: 0   aspirin 81 MG chewable tablet, Chew 1 tablet (81 mg total) by mouth daily., Disp: 30 tablet, Rfl: 0   atorvastatin (LIPITOR) 80 MG tablet, Take 1 tablet (80 mg total) by mouth daily., Disp: 30 tablet, Rfl: 0   bisoprolol (ZEBETA) 5 MG tablet, Take 0.5 tablets (2.5 mg total) by mouth daily., Disp: 30 tablet, Rfl: 0   Blood Glucose Monitoring Suppl (BLOOD GLUCOSE MONITOR SYSTEM) w/Device KIT, Use to test blood sugar once daily, Disp: 1 kit, Rfl: 0   fenofibrate (TRICOR) 145 MG tablet, Take 1 tablet (145 mg total) by mouth daily., Disp: 90 tablet, Rfl: 3   glucose blood (CONTOUR NEXT TEST) test strip, Check sugar once daily. DX E11.9, Disp: 100 each, Rfl: 12   hydrochlorothiazide (HYDRODIURIL) 25 MG tablet, Take 25 mg by mouth daily., Disp: , Rfl:    Lancets MISC, 1 each by Does not apply route daily as needed., Disp: 200 each, Rfl: 1   levocetirizine (XYZAL) 5 MG tablet, Take 5 mg by mouth every evening. (Patient not taking: Reported on 11/19/2023), Disp: , Rfl:    metFORMIN (GLUCOPHAGE) 1000 MG tablet, Take 1 tablet (1,000 mg total) by mouth 2 (two) times daily., Disp: 180 tablet, Rfl: 1   MOUNJARO 7.5 MG/0.5ML Pen, Inject 7.5 mg into the skin once a week. Sunday, Disp: , Rfl:    NON FORMULARY, CPAP MACHINE  USING EVERY NIGHT setting at 10, Disp: , Rfl:    omeprazole (PRILOSEC) 40 MG capsule, Take by mouth. (Patient not taking: Reported on 11/19/2023), Disp: , Rfl:    PARoxetine (PAXIL) 20 MG tablet, TAKE 3 TABLETS BY MOUTH ONCE DAILY (Patient taking differently: Take 60 mg by mouth daily.), Disp: 90 tablet, Rfl: 0   telmisartan (MICARDIS) 80 MG tablet, Take 80 mg by mouth daily., Disp: , Rfl:    ticagrelor (BRILINTA) 90 MG TABS tablet, Take 1 tablet (90 mg total) by mouth 2 (two) times daily., Disp: 60 tablet, Rfl: 0  Past Medical History: Past Medical History:  Diagnosis Date   Allergy    Depression    Diabetes mellitus without complication (HCC)    GERD (gastroesophageal reflux disease)    Heart murmur    Hyperlipidemia    Hypertension    Joint pain    Sleep apnea     Tobacco Use: Social History   Tobacco Use  Smoking Status Former   Current packs/day: 0.00   Average packs/day: 1 pack/day for 8.0 years (8.0 ttl pk-yrs)   Types: Cigarettes   Start date: 04/24/2010   Quit date: 04/24/2018   Years since quitting: 5.7  Smokeless Tobacco Former   Types: Chew   Quit date: 11/19/2013    Labs: Review Flowsheet  More data exists      Latest Ref Rng & Units 01/26/2020 02/17/2020 12/29/2020  11/02/2023 11/03/2023  Labs for ITP Cardiac and Pulmonary Rehab  Cholestrol 0 - 200 mg/dL 295  621  - - 308   LDL (calc) 0 - 99 mg/dL - 83  - - 74   Direct LDL mg/dL 65.7  - - - -  HDL-C >84 mg/dL 69.62  29  - - 31   Trlycerides <150 mg/dL 952.8 Triglyceride is over 400; calculations on Lipids are invalid.  367  209  - 176   Hemoglobin A1c 4.8 - 5.6 % - 7.0  7.2  5.4  -     Exercise Target Goals: Exercise Program Goal: Individual exercise prescription set using results from initial 6 min walk test and THRR while considering  patient's activity barriers and safety.   Exercise Prescription Goal: Initial exercise prescription builds to 30-45 minutes a day of aerobic activity, 2-3 days per week.  Home  exercise guidelines will be given to patient during program as part of exercise prescription that the participant will acknowledge.   Education: Aerobic Exercise: - Group verbal and visual presentation on the components of exercise prescription. Introduces F.I.T.T principle from ACSM for exercise prescriptions.  Reviews F.I.T.T. principles of aerobic exercise including progression. Written material given at graduation.   Education: Resistance Exercise: - Group verbal and visual presentation on the components of exercise prescription. Introduces F.I.T.T principle from ACSM for exercise prescriptions  Reviews F.I.T.T. principles of resistance exercise including progression. Written material given at graduation.    Education: Exercise & Equipment Safety: - Individual verbal instruction and demonstration of equipment use and safety with use of the equipment. Flowsheet Row Cardiac Rehab from 11/28/2023 in [facility] Cardiac and Pulmonary Rehab  Date 11/28/23  Educator [facility]  Instruction Review Code 1- Verbalizes Understanding       Education: Exercise Physiology & General Exercise Guidelines: - Group verbal and written instruction with models to review the exercise physiology of the cardiovascular system and associated critical values. Provides general exercise guidelines with specific guidelines to those with heart or lung disease.    Education: Flexibility, Balance, Mind/Body Relaxation: - Group verbal and visual presentation with interactive activity on the components of exercise prescription. Introduces F.I.T.T principle from ACSM for exercise prescriptions. Reviews F.I.T.T. principles of flexibility and balance exercise training including progression. Also discusses the mind body connection.  Reviews various relaxation techniques to help reduce and manage stress (i.e. Deep breathing, progressive muscle relaxation, and visualization). Balance handout provided to take home. Written material given at  graduation.   Activity Barriers & Risk Stratification:  Activity Barriers & Cardiac Risk Stratification - 11/28/23 1232       Activity Barriers & Cardiac Risk Stratification   Activity Barriers Joint Problems    Cardiac Risk Stratification High             6 Minute Walk:  6 Minute Walk     Row Name 11/28/23 1231         6 Minute Walk   Phase Initial     Distance 1350 feet     Walk Time 6 minutes     MPH 2.56     METS 3.17     RPE 9     Perceived Dyspnea  0     VO2 Peak 11.1     Symptoms No     Resting HR 52 bpm     Resting BP 138/82     Resting Oxygen Saturation  97 %     Exercise Oxygen Saturation  during 6 min  walk 97 %     Max Ex. HR 71 bpm     Max Ex. BP 154/82     2 Minute Post BP 140/80              Oxygen Initial Assessment:   Oxygen Re-Evaluation:   Oxygen Discharge (Final Oxygen Re-Evaluation):   Initial Exercise Prescription:  Initial Exercise Prescription - 11/28/23 1200       Date of Initial Exercise RX and Referring Provider   Date 11/28/23    Referring Provider Dr. Dorothyann Peng      Oxygen   Maintain Oxygen Saturation 88% or higher      Treadmill   MPH 2.6    Grade 1    Minutes 15    METs 3.35      Recumbant Bike   Level 3    RPM 50    Watts 20    Minutes 15    METs 3.17      NuStep   Level 3   T4 and T6   SPM 80    Minutes 15    METs 3.17      Elliptical   Level 1    Speed 3    Minutes 15    METs 3.17      Biostep-RELP   Level 3    SPM 50    Minutes 15    METs 3.17      Prescription Details   Frequency (times per week) 3    Duration Progress to 30 minutes of continuous aerobic without signs/symptoms of physical distress      Intensity   THRR 40-80% of Max Heartrate 94-138    Ratings of Perceived Exertion 11-13    Perceived Dyspnea 0-4      Progression   Progression Continue to progress workloads to maintain intensity without signs/symptoms of physical distress.      Resistance Training    Training Prescription Yes    Weight 4    Reps 10-15             Perform Capillary Blood Glucose checks as needed.  Exercise Prescription Changes:   Exercise Prescription Changes     Row Name 11/28/23 1200 12/11/23 1500 12/25/23 1400 01/10/24 1500 01/16/24 1500     Response to Exercise   Blood Pressure (Admit) 138/82 148/80 140/70 128/80 --   Blood Pressure (Exercise) 154/82 150/88 150/80 158/80 --   Blood Pressure (Exit) 140/80 146/80 138/78 132/80 --   Heart Rate (Admit) 52 bpm 57 bpm 68 bpm 61 bpm --   Heart Rate (Exercise) 71 bpm 86 bpm 106 bpm 111 bpm --   Heart Rate (Exit) 53 bpm 61 bpm 61 bpm 67 bpm --   Oxygen Saturation (Admit) 97 % -- -- -- --   Oxygen Saturation (Exercise) 97 % -- -- -- --   Oxygen Saturation (Exit) 98 % -- -- -- --   Rating of Perceived Exertion (Exercise) 9 13 12 12  --   Perceived Dyspnea (Exercise) 0 -- -- -- --   Symptoms none none none none --   Comments 6 MWT results First three days of exercise -- -- --   Duration -- Progress to 30 minutes of  aerobic without signs/symptoms of physical distress Continue with 30 min of aerobic exercise without signs/symptoms of physical distress. Continue with 30 min of aerobic exercise without signs/symptoms of physical distress. Continue with 30 min of aerobic exercise without signs/symptoms of physical distress.   Intensity --  THRR unchanged THRR unchanged THRR unchanged THRR unchanged     Progression   Progression -- Continue to progress workloads to maintain intensity without signs/symptoms of physical distress. Continue to progress workloads to maintain intensity without signs/symptoms of physical distress. Continue to progress workloads to maintain intensity without signs/symptoms of physical distress. Continue to progress workloads to maintain intensity without signs/symptoms of physical distress.   Average METs -- 3.01 3.23 3.5 3.5     Resistance Training   Training Prescription -- Yes Yes Yes Yes    Weight -- 4 lb 4 lb 4 lb 4 lb   Reps -- 10-15 10-15 10-15 10-15     Interval Training   Interval Training -- No No No No     Treadmill   MPH -- 2.6 2.6 3 3    Grade -- 1 1 1 1    Minutes -- 15 15 15 15    METs -- 3.35 3.35 3.71 3.71     Elliptical   Level -- 1 1 3 3    Speed -- 3 2.5 3.1 3.1   Minutes -- 15 15 15 15    METs -- -- 3.7 -- --     Biostep-RELP   Level -- 2 -- -- --   Minutes -- 15 -- -- --   METs -- 2 -- -- --     Home Exercise Plan   Plans to continue exercise at -- -- -- -- Home (comment)  walking at home   Frequency -- -- -- -- Add 2 additional days to program exercise sessions.   Initial Home Exercises Provided -- -- -- -- 01/16/24     Oxygen   Maintain Oxygen Saturation -- 88% or higher 88% or higher 88% or higher 88% or higher    Row Name 01/24/24 0800             Response to Exercise   Blood Pressure (Admit) 146/84       Blood Pressure (Exercise) 144/78       Blood Pressure (Exit) 158/86       Heart Rate (Admit) 58 bpm       Heart Rate (Exercise) 103 bpm       Heart Rate (Exit) 67 bpm       Rating of Perceived Exertion (Exercise) 12       Symptoms none       Duration Continue with 30 min of aerobic exercise without signs/symptoms of physical distress.       Intensity THRR unchanged         Progression   Progression Continue to progress workloads to maintain intensity without signs/symptoms of physical distress.       Average METs 3.3         Resistance Training   Training Prescription Yes       Weight 4 lb       Reps 10-15         Interval Training   Interval Training No         Treadmill   MPH 2.8       Grade 1       Minutes 15       METs 3.53         Elliptical   Level 3       Speed 3.1       Minutes 15       METs 2.7         Home Exercise Plan   Plans to continue  exercise at Home (comment)  walking at home       Frequency Add 2 additional days to program exercise sessions.       Initial Home Exercises Provided 01/16/24          Oxygen   Maintain Oxygen Saturation 88% or higher                Exercise Comments:   Exercise Comments     Row Name 12/03/23 1647           Exercise Comments First full day of exercise!  Patient was oriented to gym and equipment including functions, settings, policies, and procedures.  Patient's individual exercise prescription and treatment plan were reviewed.  All starting workloads were established based on the results of the 6 minute walk test done at initial orientation visit.  The plan for exercise progression was also introduced and progression will be customized based on patient's performance and goals.                Exercise Goals and Review:   Exercise Goals     Row Name 11/28/23 1236             Exercise Goals   Increase Physical Activity Yes       Intervention Provide advice, education, support and counseling about physical activity/exercise needs.;Develop an individualized exercise prescription for aerobic and resistive training based on initial evaluation findings, risk stratification, comorbidities and participant's personal goals.       Expected Outcomes Short Term: Attend rehab on a regular basis to increase amount of physical activity.;Long Term: Add in home exercise to make exercise part of routine and to increase amount of physical activity.;Long Term: Exercising regularly at least 3-5 days a week.       Increase Strength and Stamina Yes       Intervention Provide advice, education, support and counseling about physical activity/exercise needs.;Develop an individualized exercise prescription for aerobic and resistive training based on initial evaluation findings, risk stratification, comorbidities and participant's personal goals.       Expected Outcomes Short Term: Increase workloads from initial exercise prescription for resistance, speed, and METs.;Short Term: Perform resistance training exercises routinely during rehab and add in resistance  training at home;Long Term: Improve cardiorespiratory fitness, muscular endurance and strength as measured by increased METs and functional capacity ( )       Able to understand and use rate of perceived exertion (RPE) scale Yes       Intervention Provide education and explanation on how to use RPE scale       Expected Outcomes Short Term: Able to use RPE daily in rehab to express subjective intensity level;Long Term:  Able to use RPE to guide intensity level when exercising independently       Able to understand and use Dyspnea scale Yes       Intervention Provide education and explanation on how to use Dyspnea scale       Expected Outcomes Short Term: Able to use Dyspnea scale daily in rehab to express subjective sense of shortness of breath during exertion;Long Term: Able to use Dyspnea scale to guide intensity level when exercising independently       Knowledge and understanding of Target Heart Rate Range (THRR) Yes       Intervention Provide education and explanation of THRR including how the numbers were predicted and where they are located for reference       Expected Outcomes Short Term: Able to  state/look up THRR;Long Term: Able to use THRR to govern intensity when exercising independently;Short Term: Able to use daily as guideline for intensity in rehab       Able to check pulse independently Yes       Intervention Provide education and demonstration on how to check pulse in carotid and radial arteries.;Review the importance of being able to check your own pulse for safety during independent exercise       Expected Outcomes Short Term: Able to explain why pulse checking is important during independent exercise;Long Term: Able to check pulse independently and accurately       Understanding of Exercise Prescription Yes       Intervention Provide education, explanation, and written materials on patient's individual exercise prescription       Expected Outcomes Short Term: Able to explain  program exercise prescription;Long Term: Able to explain home exercise prescription to exercise independently                Exercise Goals Re-Evaluation :  Exercise Goals Re-Evaluation     Row Name 12/03/23 1647 12/11/23 1527 12/24/23 1539 12/25/23 1413 01/10/24 1556     Exercise Goal Re-Evaluation   Exercise Goals Review Increase Physical Activity;Able to understand and use rate of perceived exertion (RPE) scale;Knowledge and understanding of Target Heart Rate Range (THRR);Understanding of Exercise Prescription;Increase Strength and Stamina;Able to check pulse independently Increase Physical Activity;Increase Strength and Stamina;Understanding of Exercise Prescription Increase Physical Activity;Increase Strength and Stamina;Understanding of Exercise Prescription Increase Physical Activity;Increase Strength and Stamina;Understanding of Exercise Prescription Increase Physical Activity;Increase Strength and Stamina;Understanding of Exercise Prescription   Comments Reviewed RPE and dyspnea scale, THR and program prescription with pt today.  Pt voiced understanding and was given a copy of goals to take home. Kevin Reid is off to a good start in the program. He has done well on the treadmill at a speed of 2.6 mph with an incline of 1%. He also has tolerated the elliptical at level 1 and the biostep at level 2. He used 4 lb hand weights for resistance training as well. We will continue to monitor his progress in the program. Kevin Reid is doing well with exercise here at rehab, working on increasing eliptical to level 3 today. He reports he hasnt been working out home, but wants to set new years resulotion to begin walking some at home. Encouraged him to stay active increase workload here at rehab as able. Kevin Reid is doing well in the program. He continues to walk the treadmill at a speed of 2.6 mph with an incline of 1%. He also has stayed consistent at level 1 on the elliptical. We will contineu to monitor his progress  in the program. Kevin Reid continues to do well in rehab. He was able to increase his speed on the treadmill from 2. to . He was also able to increase to level 3 on the elliptical. We will continue to monitor his progress in the program.   Expected Outcomes Short: Use RPE daily to regulate intensity.  Long: Follow program prescription in THR. Short: Continue to follow current exercise prescription. Long: Continue exercise to improve strength and stamina. STG: continue to follow current exercise prescription and begin walking at home. LTG: exercise independently to improve strength and stamina Short: Begin to progressively increase treadmill workload. Long: Continue exercise to improve strength and stamina. Short: Continue to increase workloads. Long: Continue exercise to improve strength and stamina.    Row Name 01/16/24 1547 01/24/24 0825  Exercise Goal Re-Evaluation   Exercise Goals Review Increase Physical Activity;Able to understand and use rate of perceived exertion (RPE) scale;Knowledge and understanding of Target Heart Rate Range (THRR);Understanding of Exercise Prescription;Increase Strength and Stamina;Able to understand and use Dyspnea scale;Able to check pulse independently Increase Physical Activity;Increase Strength and Stamina;Understanding of Exercise Prescription      Comments Reviewed home exercise with pt today.  Pt plans to walk at home for exercise.  Reviewed THR, pulse, RPE, sign and symptoms, pulse oximetery and when to call 911 or MD.  Also discussed weather considerations and indoor options.  Pt voiced understanding. Kevin Reid is doing well in the program. He was able to maintain his workload on the elliptical at level 3 and a speed of 3.20mph. He did slightly decrease his speed on the treadmill from to 2.5mph, we will try to encourage him to return to previous workloads. We will continue to monitor his progress in the program.      Expected Outcomes Short: add 1-2 days a  week of walking at home on off days of rehab. Long: become independent with exercise routine. Short: Increase treadmill speed back to . Long: Continue exercise to increase strength and stamina.               Discharge Exercise Prescription (Final Exercise Prescription Changes):  Exercise Prescription Changes - 01/24/24 0800       Response to Exercise   Blood Pressure (Admit) 146/84    Blood Pressure (Exercise) 144/78    Blood Pressure (Exit) 158/86    Heart Rate (Admit) 58 bpm    Heart Rate (Exercise) 103 bpm    Heart Rate (Exit) 67 bpm    Rating of Perceived Exertion (Exercise) 12    Symptoms none    Duration Continue with 30 min of aerobic exercise without signs/symptoms of physical distress.    Intensity THRR unchanged      Progression   Progression Continue to progress workloads to maintain intensity without signs/symptoms of physical distress.    Average METs 3.3      Resistance Training   Training Prescription Yes    Weight 4 lb    Reps 10-15      Interval Training   Interval Training No      Treadmill   MPH 2.8    Grade 1    Minutes 15    METs 3.53      Elliptical   Level 3    Speed 3.1    Minutes 15    METs 2.7      Home Exercise Plan   Plans to continue exercise at Home (comment)   walking at home   Frequency Add 2 additional days to program exercise sessions.    Initial Home Exercises Provided 01/16/24      Oxygen   Maintain Oxygen Saturation 88% or higher             Nutrition:  Target Goals: Understanding of nutrition guidelines, daily intake of sodium 1500mg , cholesterol 200mg , calories 30% from fat and 7% or less from saturated fats, daily to have 5 or more servings of fruits and vegetables.  Education: All About Nutrition: -Group instruction provided by verbal, written material, interactive activities, discussions, models, and posters to present general guidelines for heart healthy nutrition including fat, fiber, MyPlate, the role  of sodium in heart healthy nutrition, utilization of the nutrition label, and utilization of this knowledge for meal planning. Follow up email sent as well. Written material  given at graduation.   Biometrics:  Pre Biometrics - 11/28/23 1237       Pre Biometrics   Height 5\' 11"  (1.803 m)    Weight 220 lb 4.8 oz (99.9 kg)    Waist Circumference 44 inches    Hip Circumference 42 inches    Waist to Hip Ratio 1.05 %    BMI (Calculated) 30.74    Single Leg Stand 30 seconds              Nutrition Therapy Plan and Nutrition Goals:  Nutrition Therapy & Goals - 11/28/23 1345       Nutrition Therapy   Diet Carc controlled, cardiac, low Na    Protein (specify units) 90    Fiber 30 grams    Whole Grain Foods 3 servings    Saturated Fats 15 max. grams    Fruits and Vegetables 5 servings/day    Sodium 2 grams      Personal Nutrition Goals   Nutrition Goal Read labels and reduce sodium intake to below 2300mg . Ideally 1500mg  per day.    Personal Goal #2 Reduce saturated fat, less than 12g per day. Replace bad fats for more heart healthy fats.    Personal Goal #3 Eat 15-30gProtein and 30-60gCarbs at each meal.    Comments Patient drinking mostly diet dr pepper. Switched from regularly sodas to diet, commended him on the change and encouraged him to continue to choose non sugary beverages. His A1C appears to have improved drastically from ~7% to 5.4%. He has never counted carbs, but tried to limit starchy foods. Spoke with him about quality of carbs, encouraging more whole grains and natural foods rather than refined carb choices. He likes whole grain breads and brown rice. Also discussed carb quantity and target of ~30-60g per meal. Reviewed Mediterranean diet handout, educated on types of fats, sources, and how to read label. Went over several of his favorite foods facts label and recommended changes when needed. Set goal to read labels and watch sodium more carefully. Built out several meals  and snacks with foods he likes and will eat focusing on less sodium and saturated fat with more balance between complex carbs and healthy protein or fat      Intervention Plan   Intervention Prescribe, educate and counsel regarding individualized specific dietary modifications aiming towards targeted core components such as weight, hypertension, lipid management, diabetes, heart failure and other comorbidities.;Nutrition handout(s) given to patient.    Expected Outcomes Short Term Goal: Understand basic principles of dietary content, such as calories, fat, sodium, cholesterol and nutrients.;Short Term Goal: A plan has been developed with personal nutrition goals set during dietitian appointment.;Long Term Goal: Adherence to prescribed nutrition plan.             Nutrition Assessments:  MEDIFICTS Score Key: >=70 Need to make dietary changes  40-70 Heart Healthy Diet <= 40 Therapeutic Level Cholesterol Diet  Flowsheet Row Cardiac Rehab from 11/28/2023 in [facility] [facility] Cardiac and Pulmonary Rehab  Picture Your Plate Total Score on Admission 65      Picture Your Plate Scores: <95 Unhealthy dietary pattern with much room for improvement. 41-50 Dietary pattern unlikely to meet recommendations for good health and room for improvement. 51-60 More healthful dietary pattern, with some room for improvement.  >60 Healthy dietary pattern, although there may be some specific behaviors that could be improved.    Nutrition Goals Re-Evaluation:  Nutrition Goals Re-Evaluation     Row Name 12/24/23 1544 01/16/24 1550  Goals   Nutrition Goal -- Read labels and reduce sodium intake to below 2300mg . Ideally 1500mg  per day.      Comment He is looking forward to 2025, feels the holidays were tough for him to follow good nutrtion goals. Says he had lots of comfort holiday food in larger portion. He is happy that he has cut back on sugary beverages, but feels he hasnt done well with replacing the  sugary beverages with water. Set goal to drink more water in place of sugary beverages. Kevin Reid states he has a hard time with health eating though the holidays but is back to his normal dietary habits and is trying to make good nutrition choices. He is reading food labels and monitoring his sodium intake. He states he tries to use a little salt as possible. He continues to reduce sugary beverages but is still not replacing them with enough water. He is trying to remember to drink more water, even though this is a struggle.      Expected Outcome STG: practice portion control, choose low calorie dense foods, drink plenty of water. LTG: Follow heart healthy lifestyle and maintain healthy weight Short: continue to work on adding more water into diet to replace sugary beverages, and continue to read food labels and monitor sodium intake. Long: maintain heart healthy diet.               Nutrition Goals Discharge (Final Nutrition Goals Re-Evaluation):  Nutrition Goals Re-Evaluation - 01/16/24 1550       Goals   Nutrition Goal Read labels and reduce sodium intake to below 2300mg . Ideally 1500mg  per day.    Comment Kevin Reid states he has a hard time with health eating though the holidays but is back to his normal dietary habits and is trying to make good nutrition choices. He is reading food labels and monitoring his sodium intake. He states he tries to use a little salt as possible. He continues to reduce sugary beverages but is still not replacing them with enough water. He is trying to remember to drink more water, even though this is a struggle.    Expected Outcome Short: continue to work on adding more water into diet to replace sugary beverages, and continue to read food labels and monitor sodium intake. Long: maintain heart healthy diet.             Psychosocial: Target Goals: Acknowledge presence or absence of significant depression and/or stress, maximize coping skills, provide positive support  system. Participant is able to verbalize types and ability to use techniques and skills needed for reducing stress and depression.   Education: Stress, Anxiety, and Depression - Group verbal and visual presentation to define topics covered.  Reviews how body is impacted by stress, anxiety, and depression.  Also discusses healthy ways to reduce stress and to treat/manage anxiety and depression.  Written material given at graduation. Flowsheet Row Cardiac Rehab from 11/28/2023 in [facility] Cardiac and Pulmonary Rehab  Education need identified 11/28/23       Education: Sleep Hygiene -Provides group verbal and written instruction about how sleep can affect your health.  Define sleep hygiene, discuss sleep cycles and impact of sleep habits. Review good sleep hygiene tips.    Initial Review & Psychosocial Screening:  Initial Psych Review & Screening - 11/27/23 1410       Initial Review   Current issues with History of Depression      Family Dynamics   Good Support System? Yes  wife, family     Barriers   Psychosocial barriers to participate in program There are no identifiable barriers or psychosocial needs.;The patient should benefit from training in stress management and relaxation.      Screening Interventions   Interventions Encouraged to exercise;Provide feedback about the scores to participant;To provide support and resources with identified psychosocial needs    Expected Outcomes Short Term goal: Utilizing psychosocial counselor, staff and physician to assist with identification of specific Stressors or current issues interfering with healing process. Setting desired goal for each stressor or current issue identified.;Long Term Goal: Stressors or current issues are controlled or eliminated.;Short Term goal: Identification and review with participant of any Quality of Life or Depression concerns found by scoring the questionnaire.;Long Term goal: The participant improves quality of Life and  PHQ9 Scores as seen by post scores and/or verbalization of changes             Quality of Life Scores:   Quality of Life - 11/28/23 1237       Quality of Life   Select Quality of Life      Quality of Life Scores   Health/Function Pre 25.29 %    Socioeconomic Pre 26.5 %    Psych/Spiritual Pre 28.29 %    Family Pre 28 %    GLOBAL Pre 26.5 %            Scores of 19 and below usually indicate a poorer quality of life in these areas.  A difference of  2-3 points is a clinically meaningful difference.  A difference of 2-3 points in the total score of the Quality of Life Index has been associated with significant improvement in overall quality of life, self-image, physical symptoms, and general health in studies assessing change in quality of life.  PHQ-9: Review Flowsheet  More data may exist      01/16/2024 11/28/2023 10/18/2020 02/17/2020 11/19/2018  Depression screen PHQ 2/9  Decreased Interest 0 1 0 1 0 0  Down, Depressed, Hopeless 0 0 - 0 1 1  PHQ - 2 Score 0 1 0 1 1 1   Altered sleeping 0 1 - 0 3  Tired, decreased energy 0 2 - 2 1  Change in appetite 0 1 - 0 0  Feeling bad or failure about yourself  0 0 - 0 0  Trouble concentrating 0 1 - 0 1  Moving slowly or fidgety/restless 0 0 - 0 0  Suicidal thoughts 0 0 - 0 0  PHQ-9 Score 0 6 - 3 6  Difficult doing work/chores Not difficult at all Not difficult at all - Not difficult at all Not difficult at all    Details       Multiple values from one day are sorted in reverse-chronological order        Interpretation of Total Score  Total Score Depression Severity:  1-4 = Minimal depression, 5-9 = Mild depression, 10-14 = Moderate depression, 15-19 = Moderately severe depression, 20-27 = Severe depression   Psychosocial Evaluation and Intervention:  Psychosocial Evaluation - 11/27/23 1425       Psychosocial Evaluation & Interventions   Interventions Encouraged to exercise with the program and follow exercise  prescription    Comments Kevin Reid is coming to cardiac rehab after a NSTEMI and stent. He states he is feeling well and just returned to work last week at the [employer]. He has a history of depression which is managed at this time with no concerns. He  does report sleep concerns. He falls asleep okay, but then wakes in a few hours. We discussed asking his doctor about medication and proper sleep hygiene. He has a supportive wife and family. He is looking forward to attending the program to work on his stamina and strength    Expected Outcomes Short: attend cardiac rehab for education and exercise. Long: develop and maintain positive self care habits    Continue Psychosocial Services  Follow up required by staff             Psychosocial Re-Evaluation:  Psychosocial Re-Evaluation     Row Name 12/24/23 1542 01/16/24 1549           Psychosocial Re-Evaluation   Current issues with Current Stress Concerns Current Stress Concerns      Comments He is doing well with sleep the first 4hrs, then wakes up and struggles to sleep the last few hours. Reports he feels well rested when starting his days. He goes to sleep early to make sure he gets good rest. He reports no stressors, anxiety or depression Kevin Reid reports that he has no new concerns or changes in sleep patterns, stress, or mental health. He continues to attend cardiac rehab the provides mental health and physical health benefits.      Expected Outcomes STG: go to bed early,, ensure good rest. LTG: maintain positive outlook on health outcomes and daily life Short: be consistent in good sleeping patterns. Long: maintain good mental health habits.      Interventions Encouraged to attend Cardiac Rehabilitation for the exercise Encouraged to attend Cardiac Rehabilitation for the exercise      Continue Psychosocial Services  Follow up required by staff Follow up required by staff               Psychosocial Discharge (Final Psychosocial  Re-Evaluation):  Psychosocial Re-Evaluation - 01/16/24 1549       Psychosocial Re-Evaluation   Current issues with Current Stress Concerns    Comments Kevin Reid reports that he has no new concerns or changes in sleep patterns, stress, or mental health. He continues to attend cardiac rehab the provides mental health and physical health benefits.    Expected Outcomes Short: be consistent in good sleeping patterns. Long: maintain good mental health habits.    Interventions Encouraged to attend Cardiac Rehabilitation for the exercise    Continue Psychosocial Services  Follow up required by staff             Vocational Rehabilitation: Provide vocational rehab assistance to qualifying candidates.   Vocational Rehab Evaluation & Intervention:  Vocational Rehab - 11/27/23 1409       Initial Vocational Rehab Evaluation & Intervention   Assessment shows need for Vocational Rehabilitation No             Education: Education Goals: Education classes will be provided on a variety of topics geared toward better understanding of heart health and risk factor modification. Participant will state understanding/return demonstration of topics presented as noted by education test scores.  Learning Barriers/Preferences:  Learning Barriers/Preferences - 11/27/23 1409       Learning Barriers/Preferences   Learning Barriers None    Learning Preferences None             General Cardiac Education Topics:  AED/CPR: - Group verbal and written instruction with the use of models to demonstrate the basic use of the AED with the basic ABC's of resuscitation.   Anatomy and Cardiac Procedures: - Group verbal and  visual presentation and models provide information about basic cardiac anatomy and function. Reviews the testing methods done to diagnose heart disease and the outcomes of the test results. Describes the treatment choices: Medical Management, Angioplasty, or Coronary Bypass Surgery for  treating various heart conditions including Myocardial Infarction, Angina, Valve Disease, and Cardiac Arrhythmias.  Written material given at graduation.   Medication Safety: - Group verbal and visual instruction to review commonly prescribed medications for heart and lung disease. Reviews the medication, class of the drug, and side effects. Includes the steps to properly store meds and maintain the prescription regimen.  Written material given at graduation.   Intimacy: - Group verbal instruction through game format to discuss how heart and lung disease can affect sexual intimacy. Written material given at graduation..   Know Your Numbers and Heart Failure: - Group verbal and visual instruction to discuss disease risk factors for cardiac and pulmonary disease and treatment options.  Reviews associated critical values for Overweight/Obesity, Hypertension, Cholesterol, and Diabetes.  Discusses basics of heart failure: signs/symptoms and treatments.  Introduces Heart Failure Zone chart for action plan for heart failure.  Written material given at graduation.   Infection Prevention: - Provides verbal and written material to individual with discussion of infection control including proper hand washing and proper equipment cleaning during exercise session. Flowsheet Row Cardiac Rehab from 11/28/2023 in [facility] Cardiac and Pulmonary Rehab  Date 11/28/23  Educator [facility]  Instruction Review Code 1- Verbalizes Understanding       Falls Prevention: - Provides verbal and written material to individual with discussion of falls prevention and safety. Flowsheet Row Cardiac Rehab from 11/28/2023 in [facility] Cardiac and Pulmonary Rehab  Date 11/28/23  Educator [facility]  Instruction Review Code 1- Verbalizes Understanding       Other: -Provides group and verbal instruction on various topics (see comments)   Knowledge Questionnaire Score:  Knowledge Questionnaire Score - 11/28/23 1237       Knowledge  Questionnaire Score   Pre Score 25/26             Core Components/Risk Factors/Patient Goals at Admission:  Personal Goals and Risk Factors at Admission - 11/28/23 1238       Core Components/Risk Factors/Patient Goals on Admission    Weight Management Yes    Intervention Weight Management: Develop a combined nutrition and exercise program designed to reach desired caloric intake, while maintaining appropriate intake of nutrient and fiber, sodium and fats, and appropriate energy expenditure required for the weight goal.;Weight Management: Provide education and appropriate resources to help participant work on and attain dietary goals.;Weight Management/Obesity: Establish reasonable short term and long term weight goals.    Admit Weight 220 lb 4.8 oz (99.9 kg)    Goal Weight: Short Term 210 lb (95.3 kg)    Goal Weight: Long Term 190 lb (86.2 kg)    Expected Outcomes Short Term: Continue to assess and modify interventions until short term weight is achieved;Long Term: Adherence to nutrition and physical activity/exercise program aimed toward attainment of established weight goal;Weight Loss: Understanding of general recommendations for a balanced deficit meal plan, which promotes 1-2 lb weight loss per week and includes a negative energy balance of [phone] kcal/d;Understanding recommendations for meals to include 15-35% energy as protein, 25-35% energy from fat, 35-60% energy from carbohydrates, less than 200mg  of dietary cholesterol, 20-35 gm of total fiber daily;Understanding of distribution of calorie intake throughout the day with the consumption of 4-5 meals/snacks    Diabetes Yes  Intervention Provide education about signs/symptoms and action to take for hypo/hyperglycemia.;Provide education about proper nutrition, including hydration, and aerobic/resistive exercise prescription along with prescribed medications to achieve blood glucose in normal ranges: Fasting glucose 65-99 mg/dL     Expected Outcomes Short Term: Participant verbalizes understanding of the signs/symptoms and immediate care of hyper/hypoglycemia, proper foot care and importance of medication, aerobic/resistive exercise and nutrition plan for blood glucose control.;Long Term: Attainment of HbA1C < 7%.    Hypertension Yes    Intervention Provide education on lifestyle modifcations including regular physical activity/exercise, weight management, moderate sodium restriction and increased consumption of fresh fruit, vegetables, and low fat dairy, alcohol moderation, and smoking cessation.;Monitor prescription use compliance.    Expected Outcomes Short Term: Continued assessment and intervention until BP is < 140/43mm HG in hypertensive participants. < 130/66mm HG in hypertensive participants with diabetes, heart failure or chronic kidney disease.;Long Term: Maintenance of blood pressure at goal levels.    Lipids Yes    Intervention Provide education and support for participant on nutrition & aerobic/resistive exercise along with prescribed medications to achieve LDL 70mg , HDL >40mg .    Expected Outcomes Short Term: Participant states understanding of desired cholesterol values and is compliant with medications prescribed. Participant is following exercise prescription and nutrition guidelines.;Long Term: Cholesterol controlled with medications as prescribed, with individualized exercise RX and with personalized nutrition plan. Value goals: LDL < 70mg , HDL > 40 mg.             Education:Diabetes - Individual verbal and written instruction to review signs/symptoms of diabetes, desired ranges of glucose level fasting, after meals and with exercise. Acknowledge that pre and post exercise glucose checks will be done for 3 sessions at entry of program. Flowsheet Row Cardiac Rehab from 11/28/2023 in [facility] Cardiac and Pulmonary Rehab  Date 11/28/23  Educator [facility]  Instruction Review Code 1- Verbalizes Understanding        Core Components/Risk Factors/Patient Goals Review:   Goals and Risk Factor Review     Row Name 12/24/23 1548 01/16/24 1554           Core Components/Risk Factors/Patient Goals Review   Personal Goals Review Weight Management/Obesity Weight Management/Obesity;Lipids;Hypertension      Review He is a little disappointed he put on ~5-8lbs over the holidays but is confident he can lose that weight again once the new year starts and he is more ontop of his health goals. Spoke with him about cutting back on sugary beverages and including more low caloire foods with water and fiber. Kevin Reid reports that he has maintained weight since the holidays but is working on picking up his activity levels to try to lose the weight he gained over the holidays. He reports that he does have a blood pressure cuff at home but does not consistently check his blood pressure. He was encouraged to start getting into the habit of checking BP at home. He does report that he takes all cholesterol and blood pressure meds as prescribed and does not have any concerns with managing his medication.      Expected Outcomes Short: lose 5-10lbs and cut back on sugary beverages. LTG: maintain healthy weight and hydrate well independently Short: start checking BP at home and work towards losing 5-10 lbs of weight. Long: control caridac risk factors               Core Components/Risk Factors/Patient Goals at Discharge (Final Review):   Goals and Risk Factor Review - 01/16/24 1554  Core Components/Risk Factors/Patient Goals Review   Personal Goals Review Weight Management/Obesity;Lipids;Hypertension    Review Kevin Reid reports that he has maintained weight since the holidays but is working on picking up his activity levels to try to lose the weight he gained over the holidays. He reports that he does have a blood pressure cuff at home but does not consistently check his blood pressure. He was encouraged to start getting into the  habit of checking BP at home. He does report that he takes all cholesterol and blood pressure meds as prescribed and does not have any concerns with managing his medication.    Expected Outcomes Short: start checking BP at home and work towards losing 5-10 lbs of weight. Long: control caridac risk factors             ITP Comments:  ITP Comments     Row Name 11/27/23 1423 11/28/23 1229 12/03/23 1647 12/12/23 1158 01/09/24 1317   ITP Comments Initial phone call completed. Diagnosis can be found in [facility] 11/8. EP Orientation scheduled for Wednesday 12/4 at 10:30. Completed and gym orientation. Initial ITP created and sent for review to Dr. Bethann Punches, Medical Director. First full day of exercise!  Patient was oriented to gym and equipment including functions, settings, policies, and procedures.  Patient's individual exercise prescription and treatment plan were reviewed.  All starting workloads were established based on the results of the 6 minute walk test done at initial orientation visit.  The plan for exercise progression was also introduced and progression will be customized based on patient's performance and goals. 30 Day review completed. Medical Director ITP review done, changes made as directed, and signed approval by Medical Director.    new to program 30 Day review completed. Medical Director ITP review done, changes made as directed, and signed approval by Medical Director.    new to program    Row Name 02/06/24 0840           ITP Comments 30 Day review completed. Medical Director ITP review done, changes made as directed, and signed approval by Medical Director.                Comments: 30 day review

## 2024-02-11 ENCOUNTER — Encounter: Payer: 59 | Admitting: *Deleted

## 2024-02-11 DIAGNOSIS — I214 Non-ST elevation (NSTEMI) myocardial infarction: Secondary | ICD-10-CM

## 2024-02-11 DIAGNOSIS — Z955 Presence of coronary angioplasty implant and graft: Secondary | ICD-10-CM

## 2024-02-11 DIAGNOSIS — I252 Old myocardial infarction: Secondary | ICD-10-CM | POA: Diagnosis not present

## 2024-02-11 DIAGNOSIS — Z48812 Encounter for surgical aftercare following surgery on the circulatory system: Secondary | ICD-10-CM | POA: Diagnosis not present

## 2024-02-11 NOTE — Progress Notes (Signed)
Daily Session Note  Patient Details  Name: Kevin Reid MRN: [id number] Date of Birth: [birth year] Referring Provider:   Flowsheet Row Cardiac Rehab from 11/28/2023 in [facility] Cardiac and Pulmonary Rehab  Referring Provider Dr. Dorothyann Peng       Encounter Date: 02/11/2024  Check In:  Session Check In - 02/11/24 1603       Check-In   Supervising physician immediately available to respond to emergencies See telemetry face sheet for immediately available ER MD    Location [facility]-Cardiac & Pulmonary Rehab    Staff Present Susann Givens RN,BSN;Joseph [facility] Manya Silvas BS, Exercise Physiologist;Kelly Cloretta Ned, ACSM CEP, Exercise Physiologist    Virtual Visit No    Medication changes reported     No    Fall or balance concerns reported    No    Warm-up and Cool-down Performed on first and last piece of equipment    Resistance Training Performed Yes    VAD Patient? No    PAD/SET Patient? No      Pain Assessment   Currently in Pain? No/denies                Social History   Tobacco Use  Smoking Status Former   Current packs/day: 0.00   Average packs/day: 1 pack/day for 8.0 years (8.0 ttl pk-yrs)   Types: Cigarettes   Start date: 04/24/2010   Quit date: 04/24/2018   Years since quitting: 5.8  Smokeless Tobacco Former   Types: Chew   Quit date: 11/19/2013    Goals Met:  Independence with exercise equipment Exercise tolerated well No report of concerns or symptoms today Strength training completed today  Goals Unmet:  Not Applicable  Comments: Pt able to follow exercise prescription today without complaint.  Will continue to monitor for progression.    Dr. Bethann Punches is Medical Director for [facility] Cardiac Rehabilitation.  Dr. Vida Rigger is Medical Director for [facility] Pulmonary Rehabilitation.

## 2024-02-12 ENCOUNTER — Encounter: Payer: 59 | Admitting: *Deleted

## 2024-02-12 DIAGNOSIS — Z955 Presence of coronary angioplasty implant and graft: Secondary | ICD-10-CM | POA: Diagnosis not present

## 2024-02-12 DIAGNOSIS — Z48812 Encounter for surgical aftercare following surgery on the circulatory system: Secondary | ICD-10-CM | POA: Diagnosis not present

## 2024-02-12 DIAGNOSIS — I252 Old myocardial infarction: Secondary | ICD-10-CM | POA: Diagnosis not present

## 2024-02-12 DIAGNOSIS — I214 Non-ST elevation (NSTEMI) myocardial infarction: Secondary | ICD-10-CM

## 2024-02-12 NOTE — Progress Notes (Signed)
Daily Session Note  Patient Details  Name: Kevin Reid MRN: [id number] Date of Birth: [birth year] Referring Provider:   Flowsheet Row Cardiac Rehab from 11/28/2023 in [facility] Cardiac and Pulmonary Rehab  Referring Provider Dr. Dorothyann Peng       Encounter Date: 02/12/2024  Check In:  Session Check In - 02/12/24 1541       Check-In   Supervising physician immediately available to respond to emergencies See telemetry face sheet for immediately available ER MD    Location [facility]-Cardiac & Pulmonary Rehab    Staff Present Cora Collum, RN, BSN, CCRP;Noah Tickle, BS, Exercise Physiologist;Joseph Hood RCP,RRT,BSRT    Virtual Visit No    Medication changes reported     No    Fall or balance concerns reported    No    Warm-up and Cool-down Performed on first and last piece of equipment    Resistance Training Performed Yes    VAD Patient? No    PAD/SET Patient? No      Pain Assessment   Currently in Pain? No/denies                Social History   Tobacco Use  Smoking Status Former   Current packs/day: 0.00   Average packs/day: 1 pack/day for 8.0 years (8.0 ttl pk-yrs)   Types: Cigarettes   Start date: 04/24/2010   Quit date: 04/24/2018   Years since quitting: 5.8  Smokeless Tobacco Former   Types: Chew   Quit date: 11/19/2013    Goals Met:  Independence with exercise equipment Exercise tolerated well No report of concerns or symptoms today  Goals Unmet:  Not Applicable  Comments: Pt able to follow exercise prescription today without complaint.  Will continue to monitor for progression.    Dr. Bethann Punches is Medical Director for [facility] Cardiac Rehabilitation.  Dr. Vida Rigger is Medical Director for [facility] Pulmonary Rehabilitation.

## 2024-02-18 ENCOUNTER — Encounter: Payer: 59 | Admitting: *Deleted

## 2024-02-18 DIAGNOSIS — Z955 Presence of coronary angioplasty implant and graft: Secondary | ICD-10-CM

## 2024-02-18 DIAGNOSIS — R0789 Other chest pain: Secondary | ICD-10-CM | POA: Diagnosis not present

## 2024-02-18 DIAGNOSIS — F411 Generalized anxiety disorder: Secondary | ICD-10-CM | POA: Diagnosis not present

## 2024-02-18 DIAGNOSIS — E6609 Other obesity due to excess calories: Secondary | ICD-10-CM | POA: Diagnosis not present

## 2024-02-18 DIAGNOSIS — R600 Localized edema: Secondary | ICD-10-CM | POA: Diagnosis not present

## 2024-02-18 DIAGNOSIS — I10 Essential (primary) hypertension: Secondary | ICD-10-CM | POA: Diagnosis not present

## 2024-02-18 DIAGNOSIS — E119 Type 2 diabetes mellitus without complications: Secondary | ICD-10-CM | POA: Diagnosis not present

## 2024-02-18 DIAGNOSIS — I214 Non-ST elevation (NSTEMI) myocardial infarction: Secondary | ICD-10-CM

## 2024-02-18 DIAGNOSIS — Z48812 Encounter for surgical aftercare following surgery on the circulatory system: Secondary | ICD-10-CM | POA: Diagnosis not present

## 2024-02-18 DIAGNOSIS — G4730 Sleep apnea, unspecified: Secondary | ICD-10-CM | POA: Diagnosis not present

## 2024-02-18 DIAGNOSIS — J449 Chronic obstructive pulmonary disease, unspecified: Secondary | ICD-10-CM | POA: Diagnosis not present

## 2024-02-18 DIAGNOSIS — E782 Mixed hyperlipidemia: Secondary | ICD-10-CM | POA: Diagnosis not present

## 2024-02-18 DIAGNOSIS — I252 Old myocardial infarction: Secondary | ICD-10-CM | POA: Diagnosis not present

## 2024-02-18 NOTE — Progress Notes (Signed)
 Daily Session Note  Patient Details  Name: Karion Cudd MRN: [id number] Date of Birth: 21-Jul-1961 Referring Provider:   Flowsheet Row Cardiac Rehab from 11/28/2023 in [facility] Cardiac and Pulmonary Rehab  Referring Provider Dr. Dorothyann Peng       Encounter Date: 02/18/2024  Check In:  Session Check In - 02/18/24 1541       Check-In   Supervising physician immediately available to respond to emergencies See telemetry face sheet for immediately available ER MD    Location [facility]-Cardiac & Pulmonary Rehab    Staff Present Susann Givens RN,BSN;Joseph [facility] BS, Exercise Physiologist    Virtual Visit No    Medication changes reported     No    Fall or balance concerns reported    No    Warm-up and Cool-down Performed on first and last piece of equipment    Resistance Training Performed Yes    VAD Patient? No      Pain Assessment   Currently in Pain? No/denies                Social History   Tobacco Use  Smoking Status Former   Current packs/day: 0.00   Average packs/day: 1 pack/day for 8.0 years (8.0 ttl pk-yrs)   Types: Cigarettes   Start date: 04/24/2010   Quit date: 04/24/2018   Years since quitting: 5.8  Smokeless Tobacco Former   Types: Chew   Quit date: 11/19/2013    Goals Met:  Independence with exercise equipment Exercise tolerated well No report of concerns or symptoms today Strength training completed today  Goals Unmet:  Not Applicable  Comments: Pt able to follow exercise prescription today without complaint.  Will continue to monitor for progression.    Dr. Bethann Punches is Medical Director for [facility] Cardiac Rehabilitation.  Dr. Vida Rigger is Medical Director for [facility] Pulmonary Rehabilitation.

## 2024-02-20 ENCOUNTER — Encounter: Payer: 59 | Admitting: *Deleted

## 2024-02-20 DIAGNOSIS — Z955 Presence of coronary angioplasty implant and graft: Secondary | ICD-10-CM

## 2024-02-20 DIAGNOSIS — I214 Non-ST elevation (NSTEMI) myocardial infarction: Secondary | ICD-10-CM

## 2024-02-20 DIAGNOSIS — I252 Old myocardial infarction: Secondary | ICD-10-CM | POA: Diagnosis not present

## 2024-02-20 DIAGNOSIS — Z48812 Encounter for surgical aftercare following surgery on the circulatory system: Secondary | ICD-10-CM | POA: Diagnosis not present

## 2024-02-20 NOTE — Progress Notes (Signed)
 Daily Session Note  Patient Details  Name: Kevin Reid MRN: [id number] Date of Birth: 09/22/1961 Referring Provider:   Flowsheet Row Cardiac Rehab from 11/28/2023 in [facility] Cardiac and Pulmonary Rehab  Referring Provider Dr. Dorothyann Peng       Encounter Date: 02/20/2024  Check In:  Session Check In - 02/20/24 1548       Check-In   Supervising physician immediately available to respond to emergencies See telemetry face sheet for immediately available ER MD    Location [facility]-Cardiac & Pulmonary Rehab    Staff Present Susann Givens RN,BSN;Joseph [facility] RCP,RRT,BSRT;Margaret Best, MS, Exercise Physiologist;Maxon Conetta BS, Exercise Physiologist    Virtual Visit No    Medication changes reported     No    Fall or balance concerns reported    No    Warm-up and Cool-down Performed on first and last piece of equipment    Resistance Training Performed Yes    VAD Patient? No    PAD/SET Patient? No      Pain Assessment   Currently in Pain? No/denies                Social History   Tobacco Use  Smoking Status Former   Current packs/day: 0.00   Average packs/day: 1 pack/day for 8.0 years (8.0 ttl pk-yrs)   Types: Cigarettes   Start date: 04/24/2010   Quit date: 04/24/2018   Years since quitting: 5.8  Smokeless Tobacco Former   Types: Chew   Quit date: 11/19/2013    Goals Met:  Independence with exercise equipment Exercise tolerated well No report of concerns or symptoms today Strength training completed today  Goals Unmet:  Not Applicable  Comments: Pt able to follow exercise prescription today without complaint.  Will continue to monitor for progression.    Dr. Bethann Punches is Medical Director for [facility] Cardiac Rehabilitation.  Dr. Vida Rigger is Medical Director for [facility] Pulmonary Rehabilitation.

## 2024-02-25 ENCOUNTER — Encounter: Payer: 59 | Attending: Internal Medicine | Admitting: *Deleted

## 2024-02-25 DIAGNOSIS — I252 Old myocardial infarction: Secondary | ICD-10-CM | POA: Diagnosis not present

## 2024-02-25 DIAGNOSIS — Z48812 Encounter for surgical aftercare following surgery on the circulatory system: Secondary | ICD-10-CM | POA: Insufficient documentation

## 2024-02-25 DIAGNOSIS — I214 Non-ST elevation (NSTEMI) myocardial infarction: Secondary | ICD-10-CM

## 2024-02-25 DIAGNOSIS — Z955 Presence of coronary angioplasty implant and graft: Secondary | ICD-10-CM | POA: Insufficient documentation

## 2024-02-25 NOTE — Progress Notes (Signed)
 Daily Session Note  Patient Details  Name: Kevin Reid MRN: [id number] Date of Birth: October 26, 1961 Referring Provider:   Flowsheet Row Cardiac Rehab from 11/28/2023 in [facility] Cardiac and Pulmonary Rehab  Referring Provider Dr. Dorothyann Peng       Encounter Date: 02/25/2024  Check In:  Session Check In - 02/25/24 1557       Check-In   Supervising physician immediately available to respond to emergencies See telemetry face sheet for immediately available ER MD    Location [facility]-Cardiac & Pulmonary Rehab    Staff Present Susann Givens RN,BSN;Joseph Hollace Kinnier;Betsy Coder PhD, RN,CNS,CEN    Virtual Visit No    Medication changes reported     No    Fall or balance concerns reported    No    Warm-up and Cool-down Performed on first and last piece of equipment    Resistance Training Performed Yes    VAD Patient? No    PAD/SET Patient? No      Pain Assessment   Currently in Pain? No/denies                Social History   Tobacco Use  Smoking Status Former   Current packs/day: 0.00   Average packs/day: 1 pack/day for 8.0 years (8.0 ttl pk-yrs)   Types: Cigarettes   Start date: 04/24/2010   Quit date: 04/24/2018   Years since quitting: 5.8  Smokeless Tobacco Former   Types: Chew   Quit date: 11/19/2013    Goals Met:  Independence with exercise equipment Exercise tolerated well No report of concerns or symptoms today Strength training completed today  Goals Unmet:  Not Applicable  Comments: Pt able to follow exercise prescription today without complaint.  Will continue to monitor for progression.    Dr. Bethann Punches is Medical Director for [facility] Cardiac Rehabilitation.  Dr. Vida Rigger is Medical Director for [facility] Pulmonary Rehabilitation.

## 2024-02-28 ENCOUNTER — Encounter: Payer: 59 | Admitting: *Deleted

## 2024-02-28 DIAGNOSIS — I214 Non-ST elevation (NSTEMI) myocardial infarction: Secondary | ICD-10-CM

## 2024-02-28 DIAGNOSIS — Z48812 Encounter for surgical aftercare following surgery on the circulatory system: Secondary | ICD-10-CM | POA: Diagnosis not present

## 2024-02-28 DIAGNOSIS — Z955 Presence of coronary angioplasty implant and graft: Secondary | ICD-10-CM | POA: Diagnosis not present

## 2024-02-28 DIAGNOSIS — I252 Old myocardial infarction: Secondary | ICD-10-CM | POA: Diagnosis not present

## 2024-02-28 NOTE — Progress Notes (Signed)
 Daily Session Note  Patient Details  Name: Kevin Reid MRN: [id number] Date of Birth: 1961-02-27 Referring Provider:   Flowsheet Row Cardiac Rehab from 11/28/2023 in [facility] Cardiac and Pulmonary Rehab  Referring Provider Dr. Dorothyann Peng       Encounter Date: 02/28/2024  Check In:  Session Check In - 02/28/24 1613       Check-In   Supervising physician immediately available to respond to emergencies See telemetry face sheet for immediately available ER MD    Location [facility]-Cardiac & Pulmonary Rehab    Staff Present Elige Ko RCP,RRT,BSRT;Betsy Coder PhD, RN,CNS,CEN;Meredith Jewel Baize RN,BSN    Virtual Visit No    Medication changes reported     No    Fall or balance concerns reported    No    Tobacco Cessation No Change    Warm-up and Cool-down Performed on first and last piece of equipment    Resistance Training Performed Yes    VAD Patient? No    PAD/SET Patient? No      Pain Assessment   Currently in Pain? No/denies                Social History   Tobacco Use  Smoking Status Former   Current packs/day: 0.00   Average packs/day: 1 pack/day for 8.0 years (8.0 ttl pk-yrs)   Types: Cigarettes   Start date: 04/24/2010   Quit date: 04/24/2018   Years since quitting: 5.8  Smokeless Tobacco Former   Types: Chew   Quit date: 11/19/2013    Goals Met:  Independence with exercise equipment Exercise tolerated well No report of concerns or symptoms today Strength training completed today  Goals Unmet:  Not Applicable  Comments: Pt able to follow exercise prescription today without complaint.  Will continue to monitor for progression.    Dr. Bethann Punches is Medical Director for [facility] Cardiac Rehabilitation.  Dr. Vida Rigger is Medical Director for [facility] Pulmonary Rehabilitation.

## 2024-03-03 ENCOUNTER — Encounter: Payer: 59 | Admitting: *Deleted

## 2024-03-03 DIAGNOSIS — Z955 Presence of coronary angioplasty implant and graft: Secondary | ICD-10-CM | POA: Diagnosis not present

## 2024-03-03 DIAGNOSIS — I252 Old myocardial infarction: Secondary | ICD-10-CM | POA: Diagnosis not present

## 2024-03-03 DIAGNOSIS — I214 Non-ST elevation (NSTEMI) myocardial infarction: Secondary | ICD-10-CM

## 2024-03-03 DIAGNOSIS — Z48812 Encounter for surgical aftercare following surgery on the circulatory system: Secondary | ICD-10-CM | POA: Diagnosis not present

## 2024-03-03 NOTE — Progress Notes (Signed)
 Daily Session Note  Patient Details  Name: Kevin Reid MRN: [id number] Date of Birth: 09-Oct-1961 Referring Provider:   Flowsheet Row Cardiac Rehab from 11/28/2023 in [facility] Cardiac and Pulmonary Rehab  Referring Provider Dr. Dorothyann Peng       Encounter Date: 03/03/2024  Check In:  Session Check In - 03/03/24 1548       Check-In   Supervising physician immediately available to respond to emergencies See telemetry face sheet for immediately available ER MD    Location [facility]-Cardiac & Pulmonary Rehab    Staff Present Susann Givens RN,BSN;Joseph Hollace Kinnier;Cora Collum, RN, BSN, CCRP    Virtual Visit No    Medication changes reported     No    Fall or balance concerns reported    No    Warm-up and Cool-down Performed on first and last piece of equipment    Resistance Training Performed Yes    VAD Patient? No    PAD/SET Patient? No      Pain Assessment   Currently in Pain? No/denies                Social History   Tobacco Use  Smoking Status Former   Current packs/day: 0.00   Average packs/day: 1 pack/day for 8.0 years (8.0 ttl pk-yrs)   Types: Cigarettes   Start date: 04/24/2010   Quit date: 04/24/2018   Years since quitting: 5.8  Smokeless Tobacco Former   Types: Chew   Quit date: 11/19/2013    Goals Met:  Independence with exercise equipment Exercise tolerated well No report of concerns or symptoms today Strength training completed today  Goals Unmet:  Not Applicable  Comments: Pt able to follow exercise prescription today without complaint.  Will continue to monitor for progression.    Dr. Bethann Punches is Medical Director for [facility] Cardiac Rehabilitation.  Dr. Vida Rigger is Medical Director for [facility] Pulmonary Rehabilitation.

## 2024-03-05 ENCOUNTER — Encounter: Payer: 59 | Admitting: *Deleted

## 2024-03-05 ENCOUNTER — Encounter: Payer: Self-pay | Admitting: *Deleted

## 2024-03-05 VITALS — Ht 71.0 in | Wt 218.8 lb

## 2024-03-05 DIAGNOSIS — Z48812 Encounter for surgical aftercare following surgery on the circulatory system: Secondary | ICD-10-CM | POA: Diagnosis not present

## 2024-03-05 DIAGNOSIS — Z955 Presence of coronary angioplasty implant and graft: Secondary | ICD-10-CM | POA: Diagnosis not present

## 2024-03-05 DIAGNOSIS — I252 Old myocardial infarction: Secondary | ICD-10-CM | POA: Diagnosis not present

## 2024-03-05 DIAGNOSIS — I214 Non-ST elevation (NSTEMI) myocardial infarction: Secondary | ICD-10-CM

## 2024-03-05 NOTE — Patient Instructions (Signed)
 Discharge Patient Instructions  Patient Details  Name: Kevin Reid MRN: [id number] Date of Birth: 30-Dec-1960 Referring Provider:  Kandyce Rud, MD   Number of Visits: 33  Reason for Discharge:  Patient reached a stable level of exercise. Patient independent in their exercise. Patient has met program and personal goals.  Smoking History:  Social History   Tobacco Use  Smoking Status Former   Current packs/day: 0.00   Average packs/day: 1 pack/day for 8.0 years (8.0 ttl pk-yrs)   Types: Cigarettes   Start date: 04/24/2010   Quit date: 04/24/2018   Years since quitting: 5.8  Smokeless Tobacco Former   Types: Chew   Quit date: 11/19/2013    Diagnosis:  NSTEMI (non-ST elevated myocardial infarction) (HCC)  Status post coronary artery stent placement  Initial Exercise Prescription:  Initial Exercise Prescription - 11/28/23 1200       Date of Initial Exercise RX and Referring Provider   Date 11/28/23    Referring Provider Dr. Dorothyann Peng      Oxygen   Maintain Oxygen Saturation 88% or higher      Treadmill   MPH 2.6    Grade 1    Minutes 15    METs 3.35      Recumbant Bike   Level 3    RPM 50    Watts 20    Minutes 15    METs 3.17      NuStep   Level 3   T4 and T6   SPM 80    Minutes 15    METs 3.17      Elliptical   Level 1    Speed 3    Minutes 15    METs 3.17      Biostep-RELP   Level 3    SPM 50    Minutes 15    METs 3.17      Prescription Details   Frequency (times per week) 3    Duration Progress to 30 minutes of continuous aerobic without signs/symptoms of physical distress      Intensity   THRR 40-80% of Max Heartrate 94-138    Ratings of Perceived Exertion 11-13    Perceived Dyspnea 0-4      Progression   Progression Continue to progress workloads to maintain intensity without signs/symptoms of physical distress.      Resistance Training   Training Prescription Yes    Weight 4    Reps 10-15              Discharge Exercise Prescription (Final Exercise Prescription Changes):  Exercise Prescription Changes - 02/21/24 1500       Response to Exercise   Blood Pressure (Admit) 160/74    Blood Pressure (Exit) 134/76    Heart Rate (Admit) 77 bpm    Heart Rate (Exercise) 105 bpm    Heart Rate (Exit) 67 bpm    Rating of Perceived Exertion (Exercise) 12    Symptoms none    Duration Continue with 30 min of aerobic exercise without signs/symptoms of physical distress.    Intensity THRR unchanged      Progression   Progression Continue to progress workloads to maintain intensity without signs/symptoms of physical distress.    Average METs 3.79      Resistance Training   Training Prescription Yes    Weight 4 lb    Reps 10-15      Interval Training   Interval Training No      Treadmill  MPH 3    Grade 1    Minutes 15    METs 3.71      Elliptical   Level 5    Speed 3.6    Minutes 15    METs 4.2      Home Exercise Plan   Plans to continue exercise at Home (comment)   walking at home   Frequency Add 2 additional days to program exercise sessions.    Initial Home Exercises Provided 01/16/24      Oxygen   Maintain Oxygen Saturation 88% or higher             Functional Capacity:  6 Minute Walk     Row Name 11/28/23 1231 03/05/24 1554       6 Minute Walk   Phase Initial Discharge    Distance 1350 feet 1545 feet    Distance % Change -- 14 %    Distance Feet Change -- 195 ft    Walk Time 6 minutes 6 minutes    # of Rest Breaks -- 0    MPH 2.56 2.93    METS 3.17 3.68    RPE 9 12    Perceived Dyspnea  0 0    VO2 Peak 11.1 12.88    Symptoms No No    Resting HR 52 bpm 54 bpm    Resting BP 138/82 138/80    Resting Oxygen Saturation  97 % --    Exercise Oxygen Saturation  during 6 min walk 97 % --    Max Ex. HR 71 bpm 76 bpm    Max Ex. BP 154/82 172/70    2 Minute Post BP 140/80 132/80            Nutrition & Weight - Outcomes:  Pre Biometrics - 11/28/23  1237       Pre Biometrics   Height 5\' 11"  (1.803 m)    Weight 220 lb 4.8 oz (99.9 kg)    Waist Circumference 44 inches    Hip Circumference 42 inches    Waist to Hip Ratio 1.05 %    BMI (Calculated) 30.74    Single Leg Stand 30 seconds             Post Biometrics - 03/05/24 1559        Post  Biometrics   Height 5\' 11"  (1.803 m)    Weight 218 lb 12.8 oz (99.2 kg)    Waist Circumference 43.25 inches    Hip Circumference 41 inches    Waist to Hip Ratio 1.05 %    BMI (Calculated) 30.53    Single Leg Stand 30 seconds             Nutrition:  Nutrition Therapy & Goals - 11/28/23 1345       Nutrition Therapy   Diet Carc controlled, cardiac, low Na    Protein (specify units) 90    Fiber 30 grams    Whole Grain Foods 3 servings    Saturated Fats 15 max. grams    Fruits and Vegetables 5 servings/day    Sodium 2 grams      Personal Nutrition Goals   Nutrition Goal Read labels and reduce sodium intake to below 2300mg . Ideally 1500mg  per day.    Personal Goal #2 Reduce saturated fat, less than 12g per day. Replace bad fats for more heart healthy fats.    Personal Goal #3 Eat 15-30gProtein and 30-60gCarbs at each meal.    Comments Patient  drinking mostly diet dr pepper. Switched from regularly sodas to diet, commended him on the change and encouraged him to continue to choose non sugary beverages. His A1C appears to have improved drastically from ~7% to 5.4%. He has never counted carbs, but tried to limit starchy foods. Spoke with him about quality of carbs, encouraging more whole grains and natural foods rather than refined carb choices. He likes whole grain breads and brown rice. Also discussed carb quantity and target of ~30-60g per meal. Reviewed Mediterranean diet handout, educated on types of fats, sources, and how to read label. Went over several of his favorite foods facts label and recommended changes when needed. Set goal to read labels and watch sodium more carefully.  Built out several meals and snacks with foods he likes and will eat focusing on less sodium and saturated fat with more balance between complex carbs and healthy protein or fat      Intervention Plan   Intervention Prescribe, educate and counsel regarding individualized specific dietary modifications aiming towards targeted core components such as weight, hypertension, lipid management, diabetes, heart failure and other comorbidities.;Nutrition handout(s) given to patient.    Expected Outcomes Short Term Goal: Understand basic principles of dietary content, such as calories, fat, sodium, cholesterol and nutrients.;Short Term Goal: A plan has been developed with personal nutrition goals set during dietitian appointment.;Long Term Goal: Adherence to prescribed nutrition plan.            Goals reviewed with patient; copy given to patient.

## 2024-03-05 NOTE — Progress Notes (Signed)
 Daily Session Note  Patient Details  Name: Kevin Reid MRN: [id number] Date of Birth: September 01, 1961 Referring Provider:   Flowsheet Row Cardiac Rehab from 11/28/2023 in [facility] Cardiac and Pulmonary Rehab  Referring Provider Dr. Dorothyann Peng       Encounter Date: 03/05/2024  Check In:  Session Check In - 03/05/24 1542       Check-In   Supervising physician immediately available to respond to emergencies See telemetry face sheet for immediately available ER MD    Location [facility]-Cardiac & Pulmonary Rehab    Staff Present Susann Givens RN,BSN;Joseph Hollace Kinnier;Cora Collum, RN, BSN, [employer], ACSM CEP, Exercise Physiologist    Virtual Visit No    Medication changes reported     No    Fall or balance concerns reported    No    Warm-up and Cool-down Performed on first and last piece of equipment    Resistance Training Performed Yes    VAD Patient? No    PAD/SET Patient? No      Pain Assessment   Currently in Pain? No/denies                Social History   Tobacco Use  Smoking Status Former   Current packs/day: 0.00   Average packs/day: 1 pack/day for 8.0 years (8.0 ttl pk-yrs)   Types: Cigarettes   Start date: 04/24/2010   Quit date: 04/24/2018   Years since quitting: 5.8  Smokeless Tobacco Former   Types: Chew   Quit date: 11/19/2013    Goals Met:  Independence with exercise equipment Exercise tolerated well No report of concerns or symptoms today Strength training completed today  Goals Unmet:  Not Applicable  Comments: Pt able to follow exercise prescription today without complaint.  Will continue to monitor for progression.   6 Minute Walk     Row Name 11/28/23 1231 03/05/24 1554       6 Minute Walk   Phase Initial Discharge    Distance 1350 feet 1545 feet    Distance % Change -- 14 %    Distance Feet Change -- 195 ft    Walk Time 6 minutes 6 minutes    # of Rest Breaks -- 0    MPH 2.56 2.93    METS 3.17 3.68    RPE 9 12     Perceived Dyspnea  0 0    VO2 Peak 11.1 12.88    Symptoms No No    Resting HR 52 bpm 54 bpm    Resting BP 138/82 138/80    Resting Oxygen Saturation  97 % --    Exercise Oxygen Saturation  during 6 min walk 97 % --    Max Ex. HR 71 bpm 76 bpm    Max Ex. BP 154/82 172/70    2 Minute Post BP 140/80 132/80                Dr. Bethann Punches is Medical Director for [facility] Cardiac Rehabilitation.  Dr. Vida Rigger is Medical Director for [facility] Pulmonary Rehabilitation.

## 2024-03-05 NOTE — Progress Notes (Signed)
 Cardiac Individual Treatment Plan  Patient Details  Name: Kevin Reid MRN: [id number] Date of Birth: [birth year] Referring Provider:   Flowsheet Row Cardiac Rehab from 11/28/2023 in [facility] Cardiac and Pulmonary Rehab  Referring Provider Dr. Dorothyann Peng       Initial Encounter Date:  Flowsheet Row Cardiac Rehab from 11/28/2023 in [facility] Cardiac and Pulmonary Rehab  Date 11/28/23       Visit Diagnosis: NSTEMI (non-ST elevated myocardial infarction) [facility])  Status post coronary artery stent placement  Patient's Home Medications on Admission:  Current Outpatient Medications:    amLODipine (NORVASC) 10 MG tablet, Take 1 tablet (10 mg total) by mouth daily., Disp: 30 tablet, Rfl: 0   aspirin 81 MG chewable tablet, Chew 1 tablet (81 mg total) by mouth daily., Disp: 30 tablet, Rfl: 0   atorvastatin (LIPITOR) 80 MG tablet, Take 1 tablet (80 mg total) by mouth daily., Disp: 30 tablet, Rfl: 0   bisoprolol (ZEBETA) 5 MG tablet, Take 0.5 tablets (2.5 mg total) by mouth daily., Disp: 30 tablet, Rfl: 0   Blood Glucose Monitoring Suppl (BLOOD GLUCOSE MONITOR SYSTEM) w/Device KIT, Use to test blood sugar once daily, Disp: 1 kit, Rfl: 0   fenofibrate (TRICOR) 145 MG tablet, Take 1 tablet (145 mg total) by mouth daily., Disp: 90 tablet, Rfl: 3   glucose blood (CONTOUR NEXT TEST) test strip, Check sugar once daily. DX E11.9, Disp: 100 each, Rfl: 12   hydrochlorothiazide (HYDRODIURIL) 25 MG tablet, Take 25 mg by mouth daily., Disp: , Rfl:    Lancets MISC, 1 each by Does not apply route daily as needed., Disp: 200 each, Rfl: 1   levocetirizine (XYZAL) 5 MG tablet, Take 5 mg by mouth every evening. (Patient not taking: Reported on 11/19/2023), Disp: , Rfl:    metFORMIN (GLUCOPHAGE) 1000 MG tablet, Take 1 tablet (1,000 mg total) by mouth 2 (two) times daily., Disp: 180 tablet, Rfl: 1   MOUNJARO 7.5 MG/0.5ML Pen, Inject 7.5 mg into the skin once a week. Sunday, Disp: , Rfl:    NON FORMULARY, CPAP MACHINE  USING EVERY NIGHT setting at 10, Disp: , Rfl:    omeprazole (PRILOSEC) 40 MG capsule, Take by mouth. (Patient not taking: Reported on 11/19/2023), Disp: , Rfl:    PARoxetine (PAXIL) 20 MG tablet, TAKE 3 TABLETS BY MOUTH ONCE DAILY (Patient taking differently: Take 60 mg by mouth daily.), Disp: 90 tablet, Rfl: 0   telmisartan (MICARDIS) 80 MG tablet, Take 80 mg by mouth daily., Disp: , Rfl:    ticagrelor (BRILINTA) 90 MG TABS tablet, Take 1 tablet (90 mg total) by mouth 2 (two) times daily., Disp: 60 tablet, Rfl: 0  Past Medical History: Past Medical History:  Diagnosis Date   Allergy    Depression    Diabetes mellitus without complication (HCC)    GERD (gastroesophageal reflux disease)    Heart murmur    Hyperlipidemia    Hypertension    Joint pain    Sleep apnea     Tobacco Use: Social History   Tobacco Use  Smoking Status Former   Current packs/day: 0.00   Average packs/day: 1 pack/day for 8.0 years (8.0 ttl pk-yrs)   Types: Cigarettes   Start date: 04/24/2010   Quit date: 04/24/2018   Years since quitting: 5.8  Smokeless Tobacco Former   Types: Chew   Quit date: 11/19/2013    Labs: Review Flowsheet  More data exists      Latest Ref Rng & Units 01/26/2020 02/17/2020 12/29/2020  11/02/2023 11/03/2023  Labs for ITP Cardiac and Pulmonary Rehab  Cholestrol 0 - 200 mg/dL 595  638  - - 756   LDL (calc) 0 - 99 mg/dL - 83  - - 74   Direct LDL mg/dL 43.3  - - - -  HDL-C >29 mg/dL 51.88  29  - - 31   Trlycerides <150 mg/dL 416.6 Triglyceride is over 400; calculations on Lipids are invalid.  367  209  - 176   Hemoglobin A1c 4.8 - 5.6 % - 7.0  7.2  5.4  -     Exercise Target Goals: Exercise Program Goal: Individual exercise prescription set using results from initial 6 min walk test and THRR while considering  patient's activity barriers and safety.   Exercise Prescription Goal: Initial exercise prescription builds to 30-45 minutes a day of aerobic activity, 2-3 days per week.  Home  exercise guidelines will be given to patient during program as part of exercise prescription that the participant will acknowledge.   Education: Aerobic Exercise: - Group verbal and visual presentation on the components of exercise prescription. Introduces F.I.T.T principle from ACSM for exercise prescriptions.  Reviews F.I.T.T. principles of aerobic exercise including progression. Written material given at graduation.   Education: Resistance Exercise: - Group verbal and visual presentation on the components of exercise prescription. Introduces F.I.T.T principle from ACSM for exercise prescriptions  Reviews F.I.T.T. principles of resistance exercise including progression. Written material given at graduation.    Education: Exercise & Equipment Safety: - Individual verbal instruction and demonstration of equipment use and safety with use of the equipment. Flowsheet Row Cardiac Rehab from 02/20/2024 in [facility] Cardiac and Pulmonary Rehab  Date 11/28/23  Educator [facility]  Instruction Review Code 1- Verbalizes Understanding       Education: Exercise Physiology & General Exercise Guidelines: - Group verbal and written instruction with models to review the exercise physiology of the cardiovascular system and associated critical values. Provides general exercise guidelines with specific guidelines to those with heart or lung disease.    Education: Flexibility, Balance, Mind/Body Relaxation: - Group verbal and visual presentation with interactive activity on the components of exercise prescription. Introduces F.I.T.T principle from ACSM for exercise prescriptions. Reviews F.I.T.T. principles of flexibility and balance exercise training including progression. Also discusses the mind body connection.  Reviews various relaxation techniques to help reduce and manage stress (i.e. Deep breathing, progressive muscle relaxation, and visualization). Balance handout provided to take home. Written material given at  graduation.   Activity Barriers & Risk Stratification:  Activity Barriers & Cardiac Risk Stratification - 11/28/23 1232       Activity Barriers & Cardiac Risk Stratification   Activity Barriers Joint Problems    Cardiac Risk Stratification High             6 Minute Walk:  6 Minute Walk     Row Name 11/28/23 1231         6 Minute Walk   Phase Initial     Distance 1350 feet     Walk Time 6 minutes     MPH 2.56     METS 3.17     RPE 9     Perceived Dyspnea  0     VO2 Peak 11.1     Symptoms No     Resting HR 52 bpm     Resting BP 138/82     Resting Oxygen Saturation  97 %     Exercise Oxygen Saturation  during 6 min  walk 97 %     Max Ex. HR 71 bpm     Max Ex. BP 154/82     2 Minute Post BP 140/80              Oxygen Initial Assessment:   Oxygen Re-Evaluation:   Oxygen Discharge (Final Oxygen Re-Evaluation):   Initial Exercise Prescription:  Initial Exercise Prescription - 11/28/23 1200       Date of Initial Exercise RX and Referring Provider   Date 11/28/23    Referring Provider Dr. Dorothyann Peng      Oxygen   Maintain Oxygen Saturation 88% or higher      Treadmill   MPH 2.6    Grade 1    Minutes 15    METs 3.35      Recumbant Bike   Level 3    RPM 50    Watts 20    Minutes 15    METs 3.17      NuStep   Level 3   T4 and T6   SPM 80    Minutes 15    METs 3.17      Elliptical   Level 1    Speed 3    Minutes 15    METs 3.17      Biostep-RELP   Level 3    SPM 50    Minutes 15    METs 3.17      Prescription Details   Frequency (times per week) 3    Duration Progress to 30 minutes of continuous aerobic without signs/symptoms of physical distress      Intensity   THRR 40-80% of Max Heartrate 94-138    Ratings of Perceived Exertion 11-13    Perceived Dyspnea 0-4      Progression   Progression Continue to progress workloads to maintain intensity without signs/symptoms of physical distress.      Resistance Training    Training Prescription Yes    Weight 4    Reps 10-15             Perform Capillary Blood Glucose checks as needed.  Exercise Prescription Changes:   Exercise Prescription Changes     Row Name 11/28/23 1200 12/11/23 1500 12/25/23 1400 01/10/24 1500 01/16/24 1500     Response to Exercise   Blood Pressure (Admit) 138/82 148/80 140/70 128/80 --   Blood Pressure (Exercise) 154/82 150/88 150/80 158/80 --   Blood Pressure (Exit) 140/80 146/80 138/78 132/80 --   Heart Rate (Admit) 52 bpm 57 bpm 68 bpm 61 bpm --   Heart Rate (Exercise) 71 bpm 86 bpm 106 bpm 111 bpm --   Heart Rate (Exit) 53 bpm 61 bpm 61 bpm 67 bpm --   Oxygen Saturation (Admit) 97 % -- -- -- --   Oxygen Saturation (Exercise) 97 % -- -- -- --   Oxygen Saturation (Exit) 98 % -- -- -- --   Rating of Perceived Exertion (Exercise) 9 13 12 12  --   Perceived Dyspnea (Exercise) 0 -- -- -- --   Symptoms none none none none --   Comments 6 MWT results First three days of exercise -- -- --   Duration -- Progress to 30 minutes of  aerobic without signs/symptoms of physical distress Continue with 30 min of aerobic exercise without signs/symptoms of physical distress. Continue with 30 min of aerobic exercise without signs/symptoms of physical distress. Continue with 30 min of aerobic exercise without signs/symptoms of physical distress.   Intensity --  THRR unchanged THRR unchanged THRR unchanged THRR unchanged     Progression   Progression -- Continue to progress workloads to maintain intensity without signs/symptoms of physical distress. Continue to progress workloads to maintain intensity without signs/symptoms of physical distress. Continue to progress workloads to maintain intensity without signs/symptoms of physical distress. Continue to progress workloads to maintain intensity without signs/symptoms of physical distress.   Average METs -- 3.01 3.23 3.5 3.5     Resistance Training   Training Prescription -- Yes Yes Yes Yes    Weight -- 4 lb 4 lb 4 lb 4 lb   Reps -- 10-15 10-15 10-15 10-15     Interval Training   Interval Training -- No No No No     Treadmill   MPH -- 2.6 2.6 3 3    Grade -- 1 1 1 1    Minutes -- 15 15 15 15    METs -- 3.35 3.35 3.71 3.71     Elliptical   Level -- 1 1 3 3    Speed -- 3 2.5 3.1 3.1   Minutes -- 15 15 15 15    METs -- -- 3.7 -- --     Biostep-RELP   Level -- 2 -- -- --   Minutes -- 15 -- -- --   METs -- 2 -- -- --     Home Exercise Plan   Plans to continue exercise at -- -- -- -- Home (comment)  walking at home   Frequency -- -- -- -- Add 2 additional days to program exercise sessions.   Initial Home Exercises Provided -- -- -- -- 01/16/24     Oxygen   Maintain Oxygen Saturation -- 88% or higher 88% or higher 88% or higher 88% or higher    Row Name 01/24/24 0800 02/07/24 0800 02/21/24 1500         Response to Exercise   Blood Pressure (Admit) 146/84 128/82 160/74     Blood Pressure (Exercise) 144/78 160/64 --     Blood Pressure (Exit) 158/86 142/72 134/76     Heart Rate (Admit) 58 bpm 56 bpm 77 bpm     Heart Rate (Exercise) 103 bpm 104 bpm 105 bpm     Heart Rate (Exit) 67 bpm 72 bpm 67 bpm     Rating of Perceived Exertion (Exercise) 12 12 12      Symptoms none none none     Duration Continue with 30 min of aerobic exercise without signs/symptoms of physical distress. Continue with 30 min of aerobic exercise without signs/symptoms of physical distress. Continue with 30 min of aerobic exercise without signs/symptoms of physical distress.     Intensity THRR unchanged THRR unchanged THRR unchanged       Progression   Progression Continue to progress workloads to maintain intensity without signs/symptoms of physical distress. Continue to progress workloads to maintain intensity without signs/symptoms of physical distress. Continue to progress workloads to maintain intensity without signs/symptoms of physical distress.     Average METs 3.3 3.45 3.79       Resistance  Training   Training Prescription Yes Yes Yes     Weight 4 lb 4 lb 4 lb     Reps 10-15 10-15 10-15       Interval Training   Interval Training No No No       Treadmill   MPH 2.8 3 3      Grade 1 1 1      Minutes 15 15 15  METs 3.53 3.71 3.71       Elliptical   Level 3 3 5      Speed 3.1 2.6 3.6     Minutes 15 15 15      METs 2.7 2.8 4.2       Biostep-RELP   Level -- 3 --     Minutes -- 15 --     METs -- 3 --       Home Exercise Plan   Plans to continue exercise at Home (comment)  walking at home Home (comment)  walking at home Home (comment)  walking at home     Frequency Add 2 additional days to program exercise sessions. Add 2 additional days to program exercise sessions. Add 2 additional days to program exercise sessions.     Initial Home Exercises Provided 01/16/24 01/16/24 01/16/24       Oxygen   Maintain Oxygen Saturation 88% or higher 88% or higher 88% or higher              Exercise Comments:   Exercise Comments     Row Name 12/03/23 1647           Exercise Comments First full day of exercise!  Patient was oriented to gym and equipment including functions, settings, policies, and procedures.  Patient's individual exercise prescription and treatment plan were reviewed.  All starting workloads were established based on the results of the 6 minute walk test done at initial orientation visit.  The plan for exercise progression was also introduced and progression will be customized based on patient's performance and goals.                Exercise Goals and Review:   Exercise Goals     Row Name 11/28/23 1236             Exercise Goals   Increase Physical Activity Yes       Intervention Provide advice, education, support and counseling about physical activity/exercise needs.;Develop an individualized exercise prescription for aerobic and resistive training based on initial evaluation findings, risk stratification, comorbidities and participant's  personal goals.       Expected Outcomes Short Term: Attend rehab on a regular basis to increase amount of physical activity.;Long Term: Add in home exercise to make exercise part of routine and to increase amount of physical activity.;Long Term: Exercising regularly at least 3-5 days a week.       Increase Strength and Stamina Yes       Intervention Provide advice, education, support and counseling about physical activity/exercise needs.;Develop an individualized exercise prescription for aerobic and resistive training based on initial evaluation findings, risk stratification, comorbidities and participant's personal goals.       Expected Outcomes Short Term: Increase workloads from initial exercise prescription for resistance, speed, and METs.;Short Term: Perform resistance training exercises routinely during rehab and add in resistance training at home;Long Term: Improve cardiorespiratory fitness, muscular endurance and strength as measured by increased METs and functional capacity ( )       Able to understand and use rate of perceived exertion (RPE) scale Yes       Intervention Provide education and explanation on how to use RPE scale       Expected Outcomes Short Term: Able to use RPE daily in rehab to express subjective intensity level;Long Term:  Able to use RPE to guide intensity level when exercising independently       Able to understand and use Dyspnea scale Yes  Intervention Provide education and explanation on how to use Dyspnea scale       Expected Outcomes Short Term: Able to use Dyspnea scale daily in rehab to express subjective sense of shortness of breath during exertion;Long Term: Able to use Dyspnea scale to guide intensity level when exercising independently       Knowledge and understanding of Target Heart Rate Range (THRR) Yes       Intervention Provide education and explanation of THRR including how the numbers were predicted and where they are located for reference        Expected Outcomes Short Term: Able to state/look up THRR;Long Term: Able to use THRR to govern intensity when exercising independently;Short Term: Able to use daily as guideline for intensity in rehab       Able to check pulse independently Yes       Intervention Provide education and demonstration on how to check pulse in carotid and radial arteries.;Review the importance of being able to check your own pulse for safety during independent exercise       Expected Outcomes Short Term: Able to explain why pulse checking is important during independent exercise;Long Term: Able to check pulse independently and accurately       Understanding of Exercise Prescription Yes       Intervention Provide education, explanation, and written materials on patient's individual exercise prescription       Expected Outcomes Short Term: Able to explain program exercise prescription;Long Term: Able to explain home exercise prescription to exercise independently                Exercise Goals Re-Evaluation :  Exercise Goals Re-Evaluation     Row Name 12/03/23 1647 12/11/23 1527 12/24/23 1539 12/25/23 1413 01/10/24 1556     Exercise Goal Re-Evaluation   Exercise Goals Review Increase Physical Activity;Able to understand and use rate of perceived exertion (RPE) scale;Knowledge and understanding of Target Heart Rate Range (THRR);Understanding of Exercise Prescription;Increase Strength and Stamina;Able to check pulse independently Increase Physical Activity;Increase Strength and Stamina;Understanding of Exercise Prescription Increase Physical Activity;Increase Strength and Stamina;Understanding of Exercise Prescription Increase Physical Activity;Increase Strength and Stamina;Understanding of Exercise Prescription Increase Physical Activity;Increase Strength and Stamina;Understanding of Exercise Prescription   Comments Reviewed RPE and dyspnea scale, THR and program prescription with pt today.  Pt voiced understanding and  was given a copy of goals to take home. Kevin Reid is off to a good start in the program. He has done well on the treadmill at a speed of 2.6 mph with an incline of 1%. He also has tolerated the elliptical at level 1 and the biostep at level 2. He used 4 lb hand weights for resistance training as well. We will continue to monitor his progress in the program. Kevin Reid is doing well with exercise here at rehab, working on increasing eliptical to level 3 today. He reports he hasnt been working out home, but wants to set new years resulotion to begin walking some at home. Encouraged him to stay active increase workload here at rehab as able. Kevin Reid is doing well in the program. He continues to walk the treadmill at a speed of 2.6 mph with an incline of 1%. He also has stayed consistent at level 1 on the elliptical. We will contineu to monitor his progress in the program. Kevin Reid continues to do well in rehab. He was able to increase his speed on the treadmill from 2. to . He was also able to increase to level  3 on the elliptical. We will continue to monitor his progress in the program.   Expected Outcomes Short: Use RPE daily to regulate intensity.  Long: Follow program prescription in THR. Short: Continue to follow current exercise prescription. Long: Continue exercise to improve strength and stamina. STG: continue to follow current exercise prescription and begin walking at home. LTG: exercise independently to improve strength and stamina Short: Begin to progressively increase treadmill workload. Long: Continue exercise to improve strength and stamina. Short: Continue to increase workloads. Long: Continue exercise to improve strength and stamina.    Row Name 01/16/24 1547 01/24/24 0825 02/07/24 0823 02/21/24 1506 03/03/24 1553     Exercise Goal Re-Evaluation   Exercise Goals Review Increase Physical Activity;Able to understand and use rate of perceived exertion (RPE) scale;Knowledge and understanding of Target Heart  Rate Range (THRR);Understanding of Exercise Prescription;Increase Strength and Stamina;Able to understand and use Dyspnea scale;Able to check pulse independently Increase Physical Activity;Increase Strength and Stamina;Understanding of Exercise Prescription Increase Physical Activity;Increase Strength and Stamina;Understanding of Exercise Prescription Increase Physical Activity;Increase Strength and Stamina;Understanding of Exercise Prescription Increase Physical Activity;Increase Strength and Stamina;Understanding of Exercise Prescription   Comments Reviewed home exercise with pt today.  Pt plans to walk at home for exercise.  Reviewed THR, pulse, RPE, sign and symptoms, pulse oximetery and when to call 911 or MD.  Also discussed weather considerations and indoor options.  Pt voiced understanding. Kevin Reid is doing well in the program. He was able to maintain his workload on the elliptical at level 3 and a speed of 3.36mph. He did slightly decrease his speed on the treadmill from to 2.74mph, we will try to encourage him to return to previous workloads. We will continue to monitor his progress in the program. Kevin Reid continues to do well in the program. He recently increased his treadmill workload back up to a speed of 3 mph with a 1% incline. He also continues to work at level 3 on both the elliptical and the biostep. He has continued to use 4 lb hand weights for resistance training but may benefit from increasing to 6 lb hand weights. We will continue to monitor his progress in the program. Kevin Reid continues to do well in the program. He continues to walk the treadmill at a speed of 3 mph with a 1% incline. He did improve to level 5 on the elliptical with a speed of 3.6 mph. We will continue to monitor his progress in the program. Kevin Reid is doing well here at the program. Encouraged him to work on increasing workload. At home he is walking outdoors when weather is nice.   Expected Outcomes Short: add 1-2 days a week of  walking at home on off days of rehab. Long: become independent with exercise routine. Short: Increase treadmill speed back to . Long: Continue exercise to increase strength and stamina. Short: Try 6 lb hand weights for resistance training. Long: Continue to increase overall METs and stamina. Short: Begin to progressively increase treadmill workload once again. Long: Continue to increase overall METs and stamina. STG: Increase workload here at Rehab. LTG: continue to increase overall METs and stamina            Discharge Exercise Prescription (Final Exercise Prescription Changes):  Exercise Prescription Changes - 02/21/24 1500       Response to Exercise   Blood Pressure (Admit) 160/74    Blood Pressure (Exit) 134/76    Heart Rate (Admit) 77 bpm    Heart Rate (Exercise) 105  bpm    Heart Rate (Exit) 67 bpm    Rating of Perceived Exertion (Exercise) 12    Symptoms none    Duration Continue with 30 min of aerobic exercise without signs/symptoms of physical distress.    Intensity THRR unchanged      Progression   Progression Continue to progress workloads to maintain intensity without signs/symptoms of physical distress.    Average METs 3.79      Resistance Training   Training Prescription Yes    Weight 4 lb    Reps 10-15      Interval Training   Interval Training No      Treadmill   MPH 3    Grade 1    Minutes 15    METs 3.71      Elliptical   Level 5    Speed 3.6    Minutes 15    METs 4.2      Home Exercise Plan   Plans to continue exercise at Home (comment)   walking at home   Frequency Add 2 additional days to program exercise sessions.    Initial Home Exercises Provided 01/16/24      Oxygen   Maintain Oxygen Saturation 88% or higher             Nutrition:  Target Goals: Understanding of nutrition guidelines, daily intake of sodium 1500mg , cholesterol 200mg , calories 30% from fat and 7% or less from saturated fats, daily to have 5 or more servings of  fruits and vegetables.  Education: All About Nutrition: -Group instruction provided by verbal, written material, interactive activities, discussions, models, and posters to present general guidelines for heart healthy nutrition including fat, fiber, MyPlate, the role of sodium in heart healthy nutrition, utilization of the nutrition label, and utilization of this knowledge for meal planning. Follow up email sent as well. Written material given at graduation.   Biometrics:  Pre Biometrics - 11/28/23 1237       Pre Biometrics   Height 5\' 11"  (1.803 m)    Weight 220 lb 4.8 oz (99.9 kg)    Waist Circumference 44 inches    Hip Circumference 42 inches    Waist to Hip Ratio 1.05 %    BMI (Calculated) 30.74    Single Leg Stand 30 seconds              Nutrition Therapy Plan and Nutrition Goals:  Nutrition Therapy & Goals - 11/28/23 1345       Nutrition Therapy   Diet Carc controlled, cardiac, low Na    Protein (specify units) 90    Fiber 30 grams    Whole Grain Foods 3 servings    Saturated Fats 15 max. grams    Fruits and Vegetables 5 servings/day    Sodium 2 grams      Personal Nutrition Goals   Nutrition Goal Read labels and reduce sodium intake to below 2300mg . Ideally 1500mg  per day.    Personal Goal #2 Reduce saturated fat, less than 12g per day. Replace bad fats for more heart healthy fats.    Personal Goal #3 Eat 15-30gProtein and 30-60gCarbs at each meal.    Comments Patient drinking mostly diet dr pepper. Switched from regularly sodas to diet, commended him on the change and encouraged him to continue to choose non sugary beverages. His A1C appears to have improved drastically from ~7% to 5.4%. He has never counted carbs, but tried to limit starchy foods. Spoke with him about quality of  carbs, encouraging more whole grains and natural foods rather than refined carb choices. He likes whole grain breads and brown rice. Also discussed carb quantity and target of ~30-60g per  meal. Reviewed Mediterranean diet handout, educated on types of fats, sources, and how to read label. Went over several of his favorite foods facts label and recommended changes when needed. Set goal to read labels and watch sodium more carefully. Built out several meals and snacks with foods he likes and will eat focusing on less sodium and saturated fat with more balance between complex carbs and healthy protein or fat      Intervention Plan   Intervention Prescribe, educate and counsel regarding individualized specific dietary modifications aiming towards targeted core components such as weight, hypertension, lipid management, diabetes, heart failure and other comorbidities.;Nutrition handout(s) given to patient.    Expected Outcomes Short Term Goal: Understand basic principles of dietary content, such as calories, fat, sodium, cholesterol and nutrients.;Short Term Goal: A plan has been developed with personal nutrition goals set during dietitian appointment.;Long Term Goal: Adherence to prescribed nutrition plan.             Nutrition Assessments:  MEDIFICTS Score Key: >=70 Need to make dietary changes  40-70 Heart Healthy Diet <= 40 Therapeutic Level Cholesterol Diet  Flowsheet Row Cardiac Rehab from 11/28/2023 in [facility] Cardiac and Pulmonary Rehab  Picture Your Plate Total Score on Admission 65      Picture Your Plate Scores: <96 Unhealthy dietary pattern with much room for improvement. 41-50 Dietary pattern unlikely to meet recommendations for good health and room for improvement. 51-60 More healthful dietary pattern, with some room for improvement.  >60 Healthy dietary pattern, although there may be some specific behaviors that could be improved.    Nutrition Goals Re-Evaluation:  Nutrition Goals Re-Evaluation     Row Name 12/24/23 1544 01/16/24 1550 03/03/24 1549         Goals   Nutrition Goal -- Read labels and reduce sodium intake to below 2300mg . Ideally 1500mg  per  day. --     Comment He is looking forward to 2025, feels the holidays were tough for him to follow good nutrtion goals. Says he had lots of comfort holiday food in larger portion. He is happy that he has cut back on sugary beverages, but feels he hasnt done well with replacing the sugary beverages with water. Set goal to drink more water in place of sugary beverages. Kevin Reid states he has a hard time with health eating though the holidays but is back to his normal dietary habits and is trying to make good nutrition choices. He is reading food labels and monitoring his sodium intake. He states he tries to use a little salt as possible. He continues to reduce sugary beverages but is still not replacing them with enough water. He is trying to remember to drink more water, even though this is a struggle. Kevin Reid says he is working on healthy eating but some days he misses the mark and over eats or make a poor choice. He had a anniversary and ate more comfort foods putting on some weight. Discussed the importance of low caloire nutrient dense foods to add to comfort foods.     Expected Outcome STG: practice portion control, choose low calorie dense foods, drink plenty of water. LTG: Follow heart healthy lifestyle and maintain healthy weight Short: continue to work on adding more water into diet to replace sugary beverages, and continue to read food labels and  monitor sodium intake. Long: maintain heart healthy diet. STG: include more colorful produce to meals. LTG: follow a heart healthy lifestyle              Nutrition Goals Discharge (Final Nutrition Goals Re-Evaluation):  Nutrition Goals Re-Evaluation - 03/03/24 1549       Goals   Comment Kevin Reid says he is working on healthy eating but some days he misses the mark and over eats or make a poor choice. He had a anniversary and ate more comfort foods putting on some weight. Discussed the importance of low caloire nutrient dense foods to add to comfort foods.     Expected Outcome STG: include more colorful produce to meals. LTG: follow a heart healthy lifestyle             Psychosocial: Target Goals: Acknowledge presence or absence of significant depression and/or stress, maximize coping skills, provide positive support system. Participant is able to verbalize types and ability to use techniques and skills needed for reducing stress and depression.   Education: Stress, Anxiety, and Depression - Group verbal and visual presentation to define topics covered.  Reviews how body is impacted by stress, anxiety, and depression.  Also discusses healthy ways to reduce stress and to treat/manage anxiety and depression.  Written material given at graduation. Flowsheet Row Cardiac Rehab from 02/20/2024 in [facility] Cardiac and Pulmonary Rehab  Education need identified 11/28/23  Date 02/20/24  Educator [facility]  Instruction Review Code 1- [employer] Understanding       Education: Sleep Hygiene -Provides group verbal and written instruction about how sleep can affect your health.  Define sleep hygiene, discuss sleep cycles and impact of sleep habits. Review good sleep hygiene tips.    Initial Review & Psychosocial Screening:  Initial Psych Review & Screening - 11/27/23 1410       Initial Review   Current issues with History of Depression      Family Dynamics   Good Support System? Yes   wife, family     Barriers   Psychosocial barriers to participate in program There are no identifiable barriers or psychosocial needs.;The patient should benefit from training in stress management and relaxation.      Screening Interventions   Interventions Encouraged to exercise;Provide feedback about the scores to participant;To provide support and resources with identified psychosocial needs    Expected Outcomes Short Term goal: Utilizing psychosocial counselor, staff and physician to assist with identification of specific Stressors or current issues interfering with healing  process. Setting desired goal for each stressor or current issue identified.;Long Term Goal: Stressors or current issues are controlled or eliminated.;Short Term goal: Identification and review with participant of any Quality of Life or Depression concerns found by scoring the questionnaire.;Long Term goal: The participant improves quality of Life and PHQ9 Scores as seen by post scores and/or verbalization of changes             Quality of Life Scores:   Quality of Life - 11/28/23 1237       Quality of Life   Select Quality of Life      Quality of Life Scores   Health/Function Pre 25.29 %    Socioeconomic Pre 26.5 %    Psych/Spiritual Pre 28.29 %    Family Pre 28 %    GLOBAL Pre 26.5 %            Scores of 19 and below usually indicate a poorer quality of life in these areas.  A difference of  2-3 points is a clinically meaningful difference.  A difference of 2-3 points in the total score of the Quality of Life Index has been associated with significant improvement in overall quality of life, self-image, physical symptoms, and general health in studies assessing change in quality of life.  PHQ-9: Review Flowsheet  More data may exist      01/16/2024 11/28/2023 10/18/2020 02/17/2020 11/19/2018  Depression screen PHQ 2/9  Decreased Interest 0 1 0 1 0 0  Down, Depressed, Hopeless 0 0 - 0 1 1  PHQ - 2 Score 0 1 0 1 1 1   Altered sleeping 0 1 - 0 3  Tired, decreased energy 0 2 - 2 1  Change in appetite 0 1 - 0 0  Feeling bad or failure about yourself  0 0 - 0 0  Trouble concentrating 0 1 - 0 1  Moving slowly or fidgety/restless 0 0 - 0 0  Suicidal thoughts 0 0 - 0 0  PHQ-9 Score 0 6 - 3 6  Difficult doing work/chores Not difficult at all Not difficult at all - Not difficult at all Not difficult at all    Details       Multiple values from one day are sorted in reverse-chronological order        Interpretation of Total Score  Total Score Depression Severity:  1-4 =  Minimal depression, 5-9 = Mild depression, 10-14 = Moderate depression, 15-19 = Moderately severe depression, 20-27 = Severe depression   Psychosocial Evaluation and Intervention:  Psychosocial Evaluation - 11/27/23 1425       Psychosocial Evaluation & Interventions   Interventions Encouraged to exercise with the program and follow exercise prescription    Comments Kevin Reid is coming to cardiac rehab after a NSTEMI and stent. He states he is feeling well and just returned to work last week at the [employer]. He has a history of depression which is managed at this time with no concerns. He does report sleep concerns. He falls asleep okay, but then wakes in a few hours. We discussed asking his doctor about medication and proper sleep hygiene. He has a supportive wife and family. He is looking forward to attending the program to work on his stamina and strength    Expected Outcomes Short: attend cardiac rehab for education and exercise. Long: develop and maintain positive self care habits    Continue Psychosocial Services  Follow up required by staff             Psychosocial Re-Evaluation:  Psychosocial Re-Evaluation     Row Name 12/24/23 1542 01/16/24 1549 03/03/24 1551         Psychosocial Re-Evaluation   Current issues with Current Stress Concerns Current Stress Concerns None Identified     Comments He is doing well with sleep the first 4hrs, then wakes up and struggles to sleep the last few hours. Reports he feels well rested when starting his days. He goes to sleep early to make sure he gets good rest. He reports no stressors, anxiety or depression Kevin Reid reports that he has no new concerns or changes in sleep patterns, stress, or mental health. He continues to attend cardiac rehab the provides mental health and physical health benefits. Kevin Reid states he is not experiencing any stress, anxiety, or depression at this time. Has a good support system. e sleeps well, currently a little sick so  hasnt slept well this past few days but says mostly he sleeps well.  Expected Outcomes STG: go to bed early,, ensure good rest. LTG: maintain positive outlook on health outcomes and daily life Short: be consistent in good sleeping patterns. Long: maintain good mental health habits. STG: Continue to use support system as needed. LTG: maintain and positive outlook on health and daily life     Interventions Encouraged to attend Cardiac Rehabilitation for the exercise Encouraged to attend Cardiac Rehabilitation for the exercise Encouraged to attend Cardiac Rehabilitation for the exercise     Continue Psychosocial Services  Follow up required by staff Follow up required by staff Follow up required by staff              Psychosocial Discharge (Final Psychosocial Re-Evaluation):  Psychosocial Re-Evaluation - 03/03/24 1551       Psychosocial Re-Evaluation   Current issues with None Identified    Comments Kevin Reid states he is not experiencing any stress, anxiety, or depression at this time. Has a good support system. e sleeps well, currently a little sick so hasnt slept well this past few days but says mostly he sleeps well.    Expected Outcomes STG: Continue to use support system as needed. LTG: maintain and positive outlook on health and daily life    Interventions Encouraged to attend Cardiac Rehabilitation for the exercise    Continue Psychosocial Services  Follow up required by staff             Vocational Rehabilitation: Provide vocational rehab assistance to qualifying candidates.   Vocational Rehab Evaluation & Intervention:  Vocational Rehab - 11/27/23 1409       Initial Vocational Rehab Evaluation & Intervention   Assessment shows need for Vocational Rehabilitation No             Education: Education Goals: Education classes will be provided on a variety of topics geared toward better understanding of heart health and risk factor modification. Participant will state  understanding/return demonstration of topics presented as noted by education test scores.  Learning Barriers/Preferences:  Learning Barriers/Preferences - 11/27/23 1409       Learning Barriers/Preferences   Learning Barriers None    Learning Preferences None             General Cardiac Education Topics:  AED/CPR: - Group verbal and written instruction with the use of models to demonstrate the basic use of the AED with the basic ABC's of resuscitation.   Anatomy and Cardiac Procedures: - Group verbal and visual presentation and models provide information about basic cardiac anatomy and function. Reviews the testing methods done to diagnose heart disease and the outcomes of the test results. Describes the treatment choices: Medical Management, Angioplasty, or Coronary Bypass Surgery for treating various heart conditions including Myocardial Infarction, Angina, Valve Disease, and Cardiac Arrhythmias.  Written material given at graduation.   Medication Safety: - Group verbal and visual instruction to review commonly prescribed medications for heart and lung disease. Reviews the medication, class of the drug, and side effects. Includes the steps to properly store meds and maintain the prescription regimen.  Written material given at graduation.   Intimacy: - Group verbal instruction through game format to discuss how heart and lung disease can affect sexual intimacy. Written material given at graduation..   Know Your Numbers and Heart Failure: - Group verbal and visual instruction to discuss disease risk factors for cardiac and pulmonary disease and treatment options.  Reviews associated critical values for Overweight/Obesity, Hypertension, Cholesterol, and Diabetes.  Discusses basics of heart failure: signs/symptoms and treatments.  Introduces Heart Failure Zone chart for action plan for heart failure.  Written material given at graduation.   Infection Prevention: - Provides verbal  and written material to individual with discussion of infection control including proper hand washing and proper equipment cleaning during exercise session. Flowsheet Row Cardiac Rehab from 02/20/2024 in [facility] Cardiac and Pulmonary Rehab  Date 11/28/23  Educator [facility]  Instruction Review Code 1- Verbalizes Understanding       Falls Prevention: - Provides verbal and written material to individual with discussion of falls prevention and safety. Flowsheet Row Cardiac Rehab from 02/20/2024 in [facility] Cardiac and Pulmonary Rehab  Date 11/28/23  Educator [facility]  Instruction Review Code 1- Verbalizes Understanding       Other: -Provides group and verbal instruction on various topics (see comments)   Knowledge Questionnaire Score:  Knowledge Questionnaire Score - 11/28/23 1237       Knowledge Questionnaire Score   Pre Score 25/26             Core Components/Risk Factors/Patient Goals at Admission:  Personal Goals and Risk Factors at Admission - 11/28/23 1238       Core Components/Risk Factors/Patient Goals on Admission    Weight Management Yes    Intervention Weight Management: Develop a combined nutrition and exercise program designed to reach desired caloric intake, while maintaining appropriate intake of nutrient and fiber, sodium and fats, and appropriate energy expenditure required for the weight goal.;Weight Management: Provide education and appropriate resources to help participant work on and attain dietary goals.;Weight Management/Obesity: Establish reasonable short term and long term weight goals.    Admit Weight 220 lb 4.8 oz (99.9 kg)    Goal Weight: Short Term 210 lb (95.3 kg)    Goal Weight: Long Term 190 lb (86.2 kg)    Expected Outcomes Short Term: Continue to assess and modify interventions until short term weight is achieved;Long Term: Adherence to nutrition and physical activity/exercise program aimed toward attainment of established weight goal;Weight Loss: Understanding  of general recommendations for a balanced deficit meal plan, which promotes 1-2 lb weight loss per week and includes a negative energy balance of [phone] kcal/d;Understanding recommendations for meals to include 15-35% energy as protein, 25-35% energy from fat, 35-60% energy from carbohydrates, less than 200mg  of dietary cholesterol, 20-35 gm of total fiber daily;Understanding of distribution of calorie intake throughout the day with the consumption of 4-5 meals/snacks    Diabetes Yes    Intervention Provide education about signs/symptoms and action to take for hypo/hyperglycemia.;Provide education about proper nutrition, including hydration, and aerobic/resistive exercise prescription along with prescribed medications to achieve blood glucose in normal ranges: Fasting glucose 65-99 mg/dL    Expected Outcomes Short Term: Participant verbalizes understanding of the signs/symptoms and immediate care of hyper/hypoglycemia, proper foot care and importance of medication, aerobic/resistive exercise and nutrition plan for blood glucose control.;Long Term: Attainment of HbA1C < 7%.    Hypertension Yes    Intervention Provide education on lifestyle modifcations including regular physical activity/exercise, weight management, moderate sodium restriction and increased consumption of fresh fruit, vegetables, and low fat dairy, alcohol moderation, and smoking cessation.;Monitor prescription use compliance.    Expected Outcomes Short Term: Continued assessment and intervention until BP is < 140/78mm HG in hypertensive participants. < 130/31mm HG in hypertensive participants with diabetes, heart failure or chronic kidney disease.;Long Term: Maintenance of blood pressure at goal levels.    Lipids Yes    Intervention Provide education and support for participant on nutrition & aerobic/resistive exercise  along with prescribed medications to achieve LDL 70mg , HDL >40mg .    Expected Outcomes Short Term: Participant states  understanding of desired cholesterol values and is compliant with medications prescribed. Participant is following exercise prescription and nutrition guidelines.;Long Term: Cholesterol controlled with medications as prescribed, with individualized exercise RX and with personalized nutrition plan. Value goals: LDL < 70mg , HDL > 40 mg.             Education:Diabetes - Individual verbal and written instruction to review signs/symptoms of diabetes, desired ranges of glucose level fasting, after meals and with exercise. Acknowledge that pre and post exercise glucose checks will be done for 3 sessions at entry of program. Flowsheet Row Cardiac Rehab from 02/20/2024 in [facility] Cardiac and Pulmonary Rehab  Date 11/28/23  Educator [facility]  Instruction Review Code 1- Verbalizes Understanding       Core Components/Risk Factors/Patient Goals Review:   Goals and Risk Factor Review     Row Name 12/24/23 1548 01/16/24 1554 03/03/24 1547         Core Components/Risk Factors/Patient Goals Review   Personal Goals Review Weight Management/Obesity Weight Management/Obesity;Lipids;Hypertension Weight Management/Obesity;Hypertension     Review He is a little disappointed he put on ~5-8lbs over the holidays but is confident he can lose that weight again once the new year starts and he is more ontop of his health goals. Spoke with him about cutting back on sugary beverages and including more low caloire foods with water and fiber. Kevin Reid reports that he has maintained weight since the holidays but is working on picking up his activity levels to try to lose the weight he gained over the holidays. He reports that he does have a blood pressure cuff at home but does not consistently check his blood pressure. He was encouraged to start getting into the habit of checking BP at home. He does report that he takes all cholesterol and blood pressure meds as prescribed and does not have any concerns with managing his medication. Kevin Reid  is looking to lose weight, reports he had a anniversary and put on some weights. He reports he he checks his BP and BS some but not like he should. Set goal today to be more consistent with checking BP.     Expected Outcomes Short: lose 5-10lbs and cut back on sugary beverages. LTG: maintain healthy weight and hydrate well independently Short: start checking BP at home and work towards losing 5-10 lbs of weight. Long: control caridac risk factors STG: check BP at home more consisitently. LTG: control cardiac risk factors              Core Components/Risk Factors/Patient Goals at Discharge (Final Review):   Goals and Risk Factor Review - 03/03/24 1547       Core Components/Risk Factors/Patient Goals Review   Personal Goals Review Weight Management/Obesity;Hypertension    Review Kevin Reid is looking to lose weight, reports he had a anniversary and put on some weights. He reports he he checks his BP and BS some but not like he should. Set goal today to be more consistent with checking BP.    Expected Outcomes STG: check BP at home more consisitently. LTG: control cardiac risk factors             ITP Comments:  ITP Comments     Row Name 11/27/23 1423 11/28/23 1229 12/03/23 1647 12/12/23 1158 01/09/24 1317   ITP Comments Initial phone call completed. Diagnosis can be found in [facility] 11/8. EP Orientation  scheduled for Wednesday 12/4 at 10:30. Completed and gym orientation. Initial ITP created and sent for review to Dr. Bethann Punches, Medical Director. First full day of exercise!  Patient was oriented to gym and equipment including functions, settings, policies, and procedures.  Patient's individual exercise prescription and treatment plan were reviewed.  All starting workloads were established based on the results of the 6 minute walk test done at initial orientation visit.  The plan for exercise progression was also introduced and progression will be customized based on patient's performance and  goals. 30 Day review completed. Medical Director ITP review done, changes made as directed, and signed approval by Medical Director.    new to program 30 Day review completed. Medical Director ITP review done, changes made as directed, and signed approval by Medical Director.    new to program    Row Name 02/06/24 0840 03/05/24 0849         ITP Comments 30 Day review completed. Medical Director ITP review done, changes made as directed, and signed approval by Medical Director. 30 Day review completed. Medical Director ITP review done, changes made as directed, and signed approval by Medical Director.               Comments: 30 Day review completed. Medical Director ITP review done, changes made as directed, and signed approval by Medical Director.

## 2024-03-06 ENCOUNTER — Encounter: Payer: 59 | Admitting: *Deleted

## 2024-03-06 DIAGNOSIS — Z955 Presence of coronary angioplasty implant and graft: Secondary | ICD-10-CM | POA: Diagnosis not present

## 2024-03-06 DIAGNOSIS — Z48812 Encounter for surgical aftercare following surgery on the circulatory system: Secondary | ICD-10-CM | POA: Diagnosis not present

## 2024-03-06 DIAGNOSIS — I214 Non-ST elevation (NSTEMI) myocardial infarction: Secondary | ICD-10-CM

## 2024-03-06 DIAGNOSIS — I252 Old myocardial infarction: Secondary | ICD-10-CM | POA: Diagnosis not present

## 2024-03-06 NOTE — Progress Notes (Signed)
 Daily Session Note  Patient Details  Name: Kevin Reid MRN: [id number] Date of Birth: 03-06-1961 Referring Provider:   Flowsheet Row Cardiac Rehab from 11/28/2023 in [facility] Cardiac and Pulmonary Rehab  Referring Provider Dr. Dorothyann Peng       Encounter Date: 03/06/2024  Check In:  Session Check In - 03/06/24 1543       Check-In   Supervising physician immediately available to respond to emergencies See telemetry face sheet for immediately available ER MD    Location [facility]-Cardiac & Pulmonary Rehab    Staff Present Susann Givens RN,BSN;Joseph [facility] BS, Exercise Physiologist;Susanne Bice, RN, BSN, CCRP    Virtual Visit No    Medication changes reported     No    Fall or balance concerns reported    No    Warm-up and Cool-down Performed on first and last piece of equipment    Resistance Training Performed Yes    VAD Patient? No    PAD/SET Patient? No      Pain Assessment   Currently in Pain? No/denies                Social History   Tobacco Use  Smoking Status Former   Current packs/day: 0.00   Average packs/day: 1 pack/day for 8.0 years (8.0 ttl pk-yrs)   Types: Cigarettes   Start date: 04/24/2010   Quit date: 04/24/2018   Years since quitting: 5.8  Smokeless Tobacco Former   Types: Chew   Quit date: 11/19/2013    Goals Met:  Independence with exercise equipment Exercise tolerated well No report of concerns or symptoms today Strength training completed today  Goals Unmet:  Not Applicable  Comments: Pt able to follow exercise prescription today without complaint.  Will continue to monitor for progression.    Dr. Bethann Punches is Medical Director for [facility] Cardiac Rehabilitation.  Dr. Vida Rigger is Medical Director for [facility] Pulmonary Rehabilitation.

## 2024-03-10 ENCOUNTER — Encounter: Payer: 59 | Admitting: *Deleted

## 2024-03-10 DIAGNOSIS — Z955 Presence of coronary angioplasty implant and graft: Secondary | ICD-10-CM | POA: Diagnosis not present

## 2024-03-10 DIAGNOSIS — I252 Old myocardial infarction: Secondary | ICD-10-CM | POA: Diagnosis not present

## 2024-03-10 DIAGNOSIS — Z48812 Encounter for surgical aftercare following surgery on the circulatory system: Secondary | ICD-10-CM | POA: Diagnosis not present

## 2024-03-10 DIAGNOSIS — I214 Non-ST elevation (NSTEMI) myocardial infarction: Secondary | ICD-10-CM

## 2024-03-10 NOTE — Progress Notes (Signed)
 Daily Session Note  Patient Details  Name: Kevin Reid MRN: [id number] Date of Birth: 12-13-1961 Referring Provider:   Flowsheet Row Cardiac Rehab from 11/28/2023 in [facility] Cardiac and Pulmonary Rehab  Referring Provider Dr. Dorothyann Peng       Encounter Date: 03/10/2024  Check In:  Session Check In - 03/10/24 1550       Check-In   Supervising physician immediately available to respond to emergencies See telemetry face sheet for immediately available ER MD    Location [facility]-Cardiac & Pulmonary Rehab    Staff Present Kevin Givens RN,BSN;Kevin Reid, [place], RRT, CPFT    Virtual Visit No    Medication changes reported     No    Fall or balance concerns reported    No    Warm-up and Cool-down Performed on first and last piece of equipment    Resistance Training Performed Yes    VAD Patient? No    PAD/SET Patient? No      Pain Assessment   Currently in Pain? No/denies                Social History   Tobacco Use  Smoking Status Former   Current packs/day: 0.00   Average packs/day: 1 pack/day for 8.0 years (8.0 ttl pk-yrs)   Types: Cigarettes   Start date: 04/24/2010   Quit date: 04/24/2018   Years since quitting: 5.8  Smokeless Tobacco Former   Types: Chew   Quit date: 11/19/2013    Goals Met:  Independence with exercise equipment Exercise tolerated well No report of concerns or symptoms today Strength training completed today  Goals Unmet:  Not Applicable  Comments: Pt able to follow exercise prescription today without complaint.  Will continue to monitor for progression.    Dr. Bethann Punches is Medical Director for [facility] Cardiac Rehabilitation.  Dr. Vida Rigger is Medical Director for [facility] Pulmonary Rehabilitation.

## 2024-03-12 ENCOUNTER — Encounter: Payer: 59 | Admitting: *Deleted

## 2024-03-12 DIAGNOSIS — I214 Non-ST elevation (NSTEMI) myocardial infarction: Secondary | ICD-10-CM

## 2024-03-12 DIAGNOSIS — Z955 Presence of coronary angioplasty implant and graft: Secondary | ICD-10-CM

## 2024-03-12 DIAGNOSIS — I252 Old myocardial infarction: Secondary | ICD-10-CM | POA: Diagnosis not present

## 2024-03-12 DIAGNOSIS — Z48812 Encounter for surgical aftercare following surgery on the circulatory system: Secondary | ICD-10-CM | POA: Diagnosis not present

## 2024-03-12 NOTE — Progress Notes (Signed)
 Daily Session Note  Patient Details  Name: Antwine Agosto MRN: [id number] Date of Birth: February 26, 1961 Referring Provider:   Flowsheet Row Cardiac Rehab from 11/28/2023 in [facility] Cardiac and Pulmonary Rehab  Referring Provider Dr. Dorothyann Peng       Encounter Date: 03/12/2024  Check In:  Session Check In - 03/12/24 1550       Check-In   Supervising physician immediately available to respond to emergencies See telemetry face sheet for immediately available ER MD    Location [facility]-Cardiac & Pulmonary Rehab    Staff Present Susann Givens RN,BSN;Joseph [facility] Madilyn Fireman BS, ACSM CEP, Exercise Physiologist;Susanne Bice, RN, BSN, CCRP    Virtual Visit No    Medication changes reported     No    Fall or balance concerns reported    No    Warm-up and Cool-down Performed on first and last piece of equipment    Resistance Training Performed Yes    VAD Patient? No    PAD/SET Patient? No      Pain Assessment   Currently in Pain? No/denies                Social History   Tobacco Use  Smoking Status Former   Current packs/day: 0.00   Average packs/day: 1 pack/day for 8.0 years (8.0 ttl pk-yrs)   Types: Cigarettes   Start date: 04/24/2010   Quit date: 04/24/2018   Years since quitting: 5.8  Smokeless Tobacco Former   Types: Chew   Quit date: 11/19/2013    Goals Met:  Independence with exercise equipment Exercise tolerated well No report of concerns or symptoms today Strength training completed today  Goals Unmet:  Not Applicable  Comments: Pt able to follow exercise prescription today without complaint.  Will continue to monitor for progression.    Dr. Bethann Punches is Medical Director for [facility] Cardiac Rehabilitation.  Dr. Vida Rigger is Medical Director for [facility] Pulmonary Rehabilitation.

## 2024-03-13 ENCOUNTER — Encounter: Payer: 59 | Admitting: *Deleted

## 2024-03-13 DIAGNOSIS — Z48812 Encounter for surgical aftercare following surgery on the circulatory system: Secondary | ICD-10-CM | POA: Diagnosis not present

## 2024-03-13 DIAGNOSIS — Z955 Presence of coronary angioplasty implant and graft: Secondary | ICD-10-CM | POA: Diagnosis not present

## 2024-03-13 DIAGNOSIS — I252 Old myocardial infarction: Secondary | ICD-10-CM | POA: Diagnosis not present

## 2024-03-13 DIAGNOSIS — I214 Non-ST elevation (NSTEMI) myocardial infarction: Secondary | ICD-10-CM

## 2024-03-13 NOTE — Progress Notes (Signed)
 Discharge Summary   Kevin Reid DOB: 01-19-2061  Kevin Reid graduated today from  rehab with 36 sessions completed.  Details of the patient's exercise prescription and what He needs to do in order to continue the prescription and progress were discussed with patient.  Patient was given a copy of prescription and goals.  Patient verbalized understanding. Kevin Reid plans to continue to exercise by walking at home .   6 Minute Walk     Row Name 11/28/23 1231 03/05/24 1554       6 Minute Walk   Phase Initial Discharge    Distance 1350 feet 1545 feet    Distance % Change -- 14 %    Distance Feet Change -- 195 ft    Walk Time 6 minutes 6 minutes    # of Rest Breaks -- 0    MPH 2.56 2.93    METS 3.17 3.68    RPE 9 12    Perceived Dyspnea  0 0    VO2 Peak 11.1 12.88    Symptoms No No    Resting HR 52 bpm 54 bpm    Resting BP 138/82 138/80    Resting Oxygen Saturation  97 % --    Exercise Oxygen Saturation  during 6 min walk 97 % --    Max Ex. HR 71 bpm 76 bpm    Max Ex. BP 154/82 172/70    2 Minute Post BP 140/80 132/80

## 2024-03-13 NOTE — Progress Notes (Signed)
 Daily Session Note  Patient Details  Name: Kevin Reid MRN: [id number] Date of Birth: [birth year] Referring Provider:   Flowsheet Row Cardiac Rehab from 11/28/2023 in [facility] Cardiac and Pulmonary Rehab  Referring Provider Dr. Dorothyann Peng       Encounter Date: 03/13/2024  Check In:  Session Check In - 03/13/24 1533       Check-In   Supervising physician immediately available to respond to emergencies See telemetry face sheet for immediately available ER MD    Location [facility]-Cardiac & Pulmonary Rehab    Staff Present Susann Givens RN,BSN;Joseph Hollace Kinnier;Cora Collum, RN, BSN, CCRP;Maxon Conetta BS, Exercise Physiologist    Virtual Visit No    Medication changes reported     No    Fall or balance concerns reported    No    Warm-up and Cool-down Performed on first and last piece of equipment    Resistance Training Performed Yes    VAD Patient? No    PAD/SET Patient? No      Pain Assessment   Currently in Pain? No/denies                Social History   Tobacco Use  Smoking Status Former   Current packs/day: 0.00   Average packs/day: 1 pack/day for 8.0 years (8.0 ttl pk-yrs)   Types: Cigarettes   Start date: 04/24/2010   Quit date: 04/24/2018   Years since quitting: 5.8  Smokeless Tobacco Former   Types: Chew   Quit date: 11/19/2013    Goals Met:  Independence with exercise equipment Exercise tolerated well No report of concerns or symptoms today Strength training completed today  Goals Unmet:  Not Applicable  Comments:  Kevin Reid graduated today from  rehab with 36 sessions completed.  Details of the patient's exercise prescription and what He needs to do in order to continue the prescription and progress were discussed with patient.  Patient was given a copy of prescription and goals.  Patient verbalized understanding. Kevin Reid plans to continue to exercise by walking at home .    Dr. Bethann Punches is Medical Director for [facility] Cardiac  Rehabilitation.  Dr. Vida Rigger is Medical Director for [facility] Pulmonary Rehabilitation.

## 2024-03-13 NOTE — Progress Notes (Signed)
 Cardiac Individual Treatment Plan  Patient Details  Name: Kevin Reid MRN: [id number] Date of Birth: [birth year] Referring Provider:   Flowsheet Row Cardiac Rehab from 11/28/2023 in [facility] Cardiac and Pulmonary Rehab  Referring Provider Dr. Dorothyann Peng       Initial Encounter Date:  Flowsheet Row Cardiac Rehab from 11/28/2023 in [facility] Cardiac and Pulmonary Rehab  Date 11/28/23       Visit Diagnosis: NSTEMI (non-ST elevated myocardial infarction) [facility])  Status post coronary artery stent placement  Patient's Home Medications on Admission:  Current Outpatient Medications:    amLODipine (NORVASC) 10 MG tablet, Take 1 tablet (10 mg total) by mouth daily., Disp: 30 tablet, Rfl: 0   aspirin 81 MG chewable tablet, Chew 1 tablet (81 mg total) by mouth daily., Disp: 30 tablet, Rfl: 0   atorvastatin (LIPITOR) 80 MG tablet, Take 1 tablet (80 mg total) by mouth daily., Disp: 30 tablet, Rfl: 0   bisoprolol (ZEBETA) 5 MG tablet, Take 0.5 tablets (2.5 mg total) by mouth daily., Disp: 30 tablet, Rfl: 0   Blood Glucose Monitoring Suppl (BLOOD GLUCOSE MONITOR SYSTEM) w/Device KIT, Use to test blood sugar once daily, Disp: 1 kit, Rfl: 0   fenofibrate (TRICOR) 145 MG tablet, Take 1 tablet (145 mg total) by mouth daily., Disp: 90 tablet, Rfl: 3   glucose blood (CONTOUR NEXT TEST) test strip, Check sugar once daily. DX E11.9, Disp: 100 each, Rfl: 12   hydrochlorothiazide (HYDRODIURIL) 25 MG tablet, Take 25 mg by mouth daily., Disp: , Rfl:    Lancets MISC, 1 each by Does not apply route daily as needed., Disp: 200 each, Rfl: 1   levocetirizine (XYZAL) 5 MG tablet, Take 5 mg by mouth every evening. (Patient not taking: Reported on 11/19/2023), Disp: , Rfl:    metFORMIN (GLUCOPHAGE) 1000 MG tablet, Take 1 tablet (1,000 mg total) by mouth 2 (two) times daily., Disp: 180 tablet, Rfl: 1   MOUNJARO 7.5 MG/0.5ML Pen, Inject 7.5 mg into the skin once a week. Sunday, Disp: , Rfl:    NON FORMULARY, CPAP MACHINE  USING EVERY NIGHT setting at 10, Disp: , Rfl:    omeprazole (PRILOSEC) 40 MG capsule, Take by mouth. (Patient not taking: Reported on 11/19/2023), Disp: , Rfl:    PARoxetine (PAXIL) 20 MG tablet, TAKE 3 TABLETS BY MOUTH ONCE DAILY (Patient taking differently: Take 60 mg by mouth daily.), Disp: 90 tablet, Rfl: 0   telmisartan (MICARDIS) 80 MG tablet, Take 80 mg by mouth daily., Disp: , Rfl:    ticagrelor (BRILINTA) 90 MG TABS tablet, Take 1 tablet (90 mg total) by mouth 2 (two) times daily., Disp: 60 tablet, Rfl: 0  Past Medical History: Past Medical History:  Diagnosis Date   Allergy    Depression    Diabetes mellitus without complication (HCC)    GERD (gastroesophageal reflux disease)    Heart murmur    Hyperlipidemia    Hypertension    Joint pain    Sleep apnea     Tobacco Use: Social History   Tobacco Use  Smoking Status Former   Current packs/day: 0.00   Average packs/day: 1 pack/day for 8.0 years (8.0 ttl pk-yrs)   Types: Cigarettes   Start date: 04/24/2010   Quit date: 04/24/2018   Years since quitting: 5.8  Smokeless Tobacco Former   Types: Chew   Quit date: 11/19/2013    Labs: Review Flowsheet  More data exists      Latest Ref Rng & Units 01/26/2020 02/17/2020 12/29/2020  11/02/2023 11/03/2023  Labs for ITP Cardiac and Pulmonary Rehab  Cholestrol 0 - 200 mg/dL 409  811  - - 914   LDL (calc) 0 - 99 mg/dL - 83  - - 74   Direct LDL mg/dL 78.2  - - - -  HDL-C >95 mg/dL 62.13  29  - - 31   Trlycerides <150 mg/dL 086.5 Triglyceride is over 400; calculations on Lipids are invalid.  367  209  - 176   Hemoglobin A1c 4.8 - 5.6 % - 7.0  7.2  5.4  -     Exercise Target Goals: Exercise Program Goal: Individual exercise prescription set using results from initial 6 min walk test and THRR while considering  patient's activity barriers and safety.   Exercise Prescription Goal: Initial exercise prescription builds to 30-45 minutes a day of aerobic activity, 2-3 days per week.  Home  exercise guidelines will be given to patient during program as part of exercise prescription that the participant will acknowledge.   Education: Aerobic Exercise: - Group verbal and visual presentation on the components of exercise prescription. Introduces F.I.T.T principle from ACSM for exercise prescriptions.  Reviews F.I.T.T. principles of aerobic exercise including progression. Written material given at graduation. Flowsheet Row Cardiac Rehab from 03/12/2024 in [facility] Cardiac and Pulmonary Rehab  Date 03/12/24  Educator [facility]  Instruction Review Code 1- [employer] Understanding       Education: Resistance Exercise: - Group verbal and visual presentation on the components of exercise prescription. Introduces F.I.T.T principle from ACSM for exercise prescriptions  Reviews F.I.T.T. principles of resistance exercise including progression. Written material given at graduation. Flowsheet Row Cardiac Rehab from 03/12/2024 in [facility] Cardiac and Pulmonary Rehab  Date 03/05/24  Educator [facility]  Instruction Review Code 1- [employer] Understanding        Education: Exercise & Equipment Safety: - Individual verbal instruction and demonstration of equipment use and safety with use of the equipment. Flowsheet Row Cardiac Rehab from 03/12/2024 in [facility] Cardiac and Pulmonary Rehab  Date 11/28/23  Educator [facility]  Instruction Review Code 1- Verbalizes Understanding       Education: Exercise Physiology & General Exercise Guidelines: - Group verbal and written instruction with models to review the exercise physiology of the cardiovascular system and associated critical values. Provides general exercise guidelines with specific guidelines to those with heart or lung disease.    Education: Flexibility, Balance, Mind/Body Relaxation: - Group verbal and visual presentation with interactive activity on the components of exercise prescription. Introduces F.I.T.T principle from ACSM for exercise prescriptions. Reviews  F.I.T.T. principles of flexibility and balance exercise training including progression. Also discusses the mind body connection.  Reviews various relaxation techniques to help reduce and manage stress (i.e. Deep breathing, progressive muscle relaxation, and visualization). Balance handout provided to take home. Written material given at graduation. Flowsheet Row Cardiac Rehab from 03/12/2024 in [facility] Cardiac and Pulmonary Rehab  Date 03/05/24  Educator [facility]  Instruction Review Code 1- Verbalizes Understanding       Activity Barriers & Risk Stratification:  Activity Barriers & Cardiac Risk Stratification - 11/28/23 1232       Activity Barriers & Cardiac Risk Stratification   Activity Barriers Joint Problems    Cardiac Risk Stratification High             6 Minute Walk:  6 Minute Walk     Row Name 11/28/23 1231 03/05/24 1554       6 Minute Walk   Phase Initial Discharge  Distance 1350 feet 1545 feet    Distance % Change -- 14 %    Distance Feet Change -- 195 ft    Walk Time 6 minutes 6 minutes    # of Rest Breaks -- 0    MPH 2.56 2.93    METS 3.17 3.68    RPE 9 12    Perceived Dyspnea  0 0    VO2 Peak 11.1 12.88    Symptoms No No    Resting HR 52 bpm 54 bpm    Resting BP 138/82 138/80    Resting Oxygen Saturation  97 % --    Exercise Oxygen Saturation  during 6 min walk 97 % --    Max Ex. HR 71 bpm 76 bpm    Max Ex. BP 154/82 172/70    2 Minute Post BP 140/80 132/80             Oxygen Initial Assessment:   Oxygen Re-Evaluation:   Oxygen Discharge (Final Oxygen Re-Evaluation):   Initial Exercise Prescription:  Initial Exercise Prescription - 11/28/23 1200       Date of Initial Exercise RX and Referring Provider   Date 11/28/23    Referring Provider Dr. Dorothyann Peng      Oxygen   Maintain Oxygen Saturation 88% or higher      Treadmill   MPH 2.6    Grade 1    Minutes 15    METs 3.35      Recumbant Bike   Level 3    RPM 50    Watts 20     Minutes 15    METs 3.17      NuStep   Level 3   T4 and T6   SPM 80    Minutes 15    METs 3.17      Elliptical   Level 1    Speed 3    Minutes 15    METs 3.17      Biostep-RELP   Level 3    SPM 50    Minutes 15    METs 3.17      Prescription Details   Frequency (times per week) 3    Duration Progress to 30 minutes of continuous aerobic without signs/symptoms of physical distress      Intensity   THRR 40-80% of Max Heartrate 94-138    Ratings of Perceived Exertion 11-13    Perceived Dyspnea 0-4      Progression   Progression Continue to progress workloads to maintain intensity without signs/symptoms of physical distress.      Resistance Training   Training Prescription Yes    Weight 4    Reps 10-15             Perform Capillary Blood Glucose checks as needed.  Exercise Prescription Changes:   Exercise Prescription Changes     Row Name 11/28/23 1200 12/11/23 1500 12/25/23 1400 01/10/24 1500 01/16/24 1500     Response to Exercise   Blood Pressure (Admit) 138/82 148/80 140/70 128/80 --   Blood Pressure (Exercise) 154/82 150/88 150/80 158/80 --   Blood Pressure (Exit) 140/80 146/80 138/78 132/80 --   Heart Rate (Admit) 52 bpm 57 bpm 68 bpm 61 bpm --   Heart Rate (Exercise) 71 bpm 86 bpm 106 bpm 111 bpm --   Heart Rate (Exit) 53 bpm 61 bpm 61 bpm 67 bpm --   Oxygen Saturation (Admit) 97 % -- -- -- --   Oxygen Saturation (  Exercise) 97 % -- -- -- --   Oxygen Saturation (Exit) 98 % -- -- -- --   Rating of Perceived Exertion (Exercise) 9 13 12 12  --   Perceived Dyspnea (Exercise) 0 -- -- -- --   Symptoms none none none none --   Comments 6 MWT results First three days of exercise -- -- --   Duration -- Progress to 30 minutes of  aerobic without signs/symptoms of physical distress Continue with 30 min of aerobic exercise without signs/symptoms of physical distress. Continue with 30 min of aerobic exercise without signs/symptoms of physical distress. Continue  with 30 min of aerobic exercise without signs/symptoms of physical distress.   Intensity -- THRR unchanged THRR unchanged THRR unchanged THRR unchanged     Progression   Progression -- Continue to progress workloads to maintain intensity without signs/symptoms of physical distress. Continue to progress workloads to maintain intensity without signs/symptoms of physical distress. Continue to progress workloads to maintain intensity without signs/symptoms of physical distress. Continue to progress workloads to maintain intensity without signs/symptoms of physical distress.   Average METs -- 3.01 3.23 3.5 3.5     Resistance Training   Training Prescription -- Yes Yes Yes Yes   Weight -- 4 lb 4 lb 4 lb 4 lb   Reps -- 10-15 10-15 10-15 10-15     Interval Training   Interval Training -- No No No No     Treadmill   MPH -- 2.6 2.6 3 3    Grade -- 1 1 1 1    Minutes -- 15 15 15 15    METs -- 3.35 3.35 3.71 3.71     Elliptical   Level -- 1 1 3 3    Speed -- 3 2.5 3.1 3.1   Minutes -- 15 15 15 15    METs -- -- 3.7 -- --     Biostep-RELP   Level -- 2 -- -- --   Minutes -- 15 -- -- --   METs -- 2 -- -- --     Home Exercise Plan   Plans to continue exercise at -- -- -- -- Home (comment)  walking at home   Frequency -- -- -- -- Add 2 additional days to program exercise sessions.   Initial Home Exercises Provided -- -- -- -- 01/16/24     Oxygen   Maintain Oxygen Saturation -- 88% or higher 88% or higher 88% or higher 88% or higher    Row Name 01/24/24 0800 02/07/24 0800 02/21/24 1500 03/06/24 1100       Response to Exercise   Blood Pressure (Admit) 146/84 128/82 160/74 140/70    Blood Pressure (Exercise) 144/78 160/64 -- 190/96    Blood Pressure (Exit) 158/86 142/72 134/76 142/70    Heart Rate (Admit) 58 bpm 56 bpm 77 bpm 99 bpm    Heart Rate (Exercise) 103 bpm 104 bpm 105 bpm 103 bpm    Heart Rate (Exit) 67 bpm 72 bpm 67 bpm 58 bpm    Rating of Perceived Exertion (Exercise) 12 12 12 12      Symptoms none none none none    Duration Continue with 30 min of aerobic exercise without signs/symptoms of physical distress. Continue with 30 min of aerobic exercise without signs/symptoms of physical distress. Continue with 30 min of aerobic exercise without signs/symptoms of physical distress. Continue with 30 min of aerobic exercise without signs/symptoms of physical distress.    Intensity THRR unchanged THRR unchanged THRR unchanged THRR unchanged  Progression   Progression Continue to progress workloads to maintain intensity without signs/symptoms of physical distress. Continue to progress workloads to maintain intensity without signs/symptoms of physical distress. Continue to progress workloads to maintain intensity without signs/symptoms of physical distress. Continue to progress workloads to maintain intensity without signs/symptoms of physical distress.    Average METs 3.3 3.45 3.79 3.29      Resistance Training   Training Prescription Yes Yes Yes Yes    Weight 4 lb 4 lb 4 lb 4 lb    Reps 10-15 10-15 10-15 10-15      Interval Training   Interval Training No No No No      Treadmill   MPH 2.8 3 3  2.8    Grade 1 1 1 1     Minutes 15 15 15 15     METs 3.53 3.71 3.71 3.53      Elliptical   Level 3 3 5 6     Speed 3.1 2.6 3.6 4    Minutes 15 15 15 15     METs 2.7 2.8 4.2 3      Biostep-RELP   Level -- 3 -- --    Minutes -- 15 -- --    METs -- 3 -- --      Home Exercise Plan   Plans to continue exercise at Home (comment)  walking at home Home (comment)  walking at home Home (comment)  walking at home Home (comment)  walking at home    Frequency Add 2 additional days to program exercise sessions. Add 2 additional days to program exercise sessions. Add 2 additional days to program exercise sessions. Add 2 additional days to program exercise sessions.    Initial Home Exercises Provided 01/16/24 01/16/24 01/16/24 01/16/24      Oxygen   Maintain Oxygen Saturation 88% or  higher 88% or higher 88% or higher 88% or higher             Exercise Comments:   Exercise Comments     Row Name 12/03/23 1647           Exercise Comments First full day of exercise!  Patient was oriented to gym and equipment including functions, settings, policies, and procedures.  Patient's individual exercise prescription and treatment plan were reviewed.  All starting workloads were established based on the results of the 6 minute walk test done at initial orientation visit.  The plan for exercise progression was also introduced and progression will be customized based on patient's performance and goals.                Exercise Goals and Review:   Exercise Goals     Row Name 11/28/23 1236             Exercise Goals   Increase Physical Activity Yes       Intervention Provide advice, education, support and counseling about physical activity/exercise needs.;Develop an individualized exercise prescription for aerobic and resistive training based on initial evaluation findings, risk stratification, comorbidities and participant's personal goals.       Expected Outcomes Short Term: Attend rehab on a regular basis to increase amount of physical activity.;Long Term: Add in home exercise to make exercise part of routine and to increase amount of physical activity.;Long Term: Exercising regularly at least 3-5 days a week.       Increase Strength and Stamina Yes       Intervention Provide advice, education, support and counseling about physical activity/exercise needs.;Develop an individualized  exercise prescription for aerobic and resistive training based on initial evaluation findings, risk stratification, comorbidities and participant's personal goals.       Expected Outcomes Short Term: Increase workloads from initial exercise prescription for resistance, speed, and METs.;Short Term: Perform resistance training exercises routinely during rehab and add in resistance training at  home;Long Term: Improve cardiorespiratory fitness, muscular endurance and strength as measured by increased METs and functional capacity ( )       Able to understand and use rate of perceived exertion (RPE) scale Yes       Intervention Provide education and explanation on how to use RPE scale       Expected Outcomes Short Term: Able to use RPE daily in rehab to express subjective intensity level;Long Term:  Able to use RPE to guide intensity level when exercising independently       Able to understand and use Dyspnea scale Yes       Intervention Provide education and explanation on how to use Dyspnea scale       Expected Outcomes Short Term: Able to use Dyspnea scale daily in rehab to express subjective sense of shortness of breath during exertion;Long Term: Able to use Dyspnea scale to guide intensity level when exercising independently       Knowledge and understanding of Target Heart Rate Range (THRR) Yes       Intervention Provide education and explanation of THRR including how the numbers were predicted and where they are located for reference       Expected Outcomes Short Term: Able to state/look up THRR;Long Term: Able to use THRR to govern intensity when exercising independently;Short Term: Able to use daily as guideline for intensity in rehab       Able to check pulse independently Yes       Intervention Provide education and demonstration on how to check pulse in carotid and radial arteries.;Review the importance of being able to check your own pulse for safety during independent exercise       Expected Outcomes Short Term: Able to explain why pulse checking is important during independent exercise;Long Term: Able to check pulse independently and accurately       Understanding of Exercise Prescription Yes       Intervention Provide education, explanation, and written materials on patient's individual exercise prescription       Expected Outcomes Short Term: Able to explain program  exercise prescription;Long Term: Able to explain home exercise prescription to exercise independently                Exercise Goals Re-Evaluation :  Exercise Goals Re-Evaluation     Row Name 12/03/23 1647 12/11/23 1527 12/24/23 1539 12/25/23 1413 01/10/24 1556     Exercise Goal Re-Evaluation   Exercise Goals Review Increase Physical Activity;Able to understand and use rate of perceived exertion (RPE) scale;Knowledge and understanding of Target Heart Rate Range (THRR);Understanding of Exercise Prescription;Increase Strength and Stamina;Able to check pulse independently Increase Physical Activity;Increase Strength and Stamina;Understanding of Exercise Prescription Increase Physical Activity;Increase Strength and Stamina;Understanding of Exercise Prescription Increase Physical Activity;Increase Strength and Stamina;Understanding of Exercise Prescription Increase Physical Activity;Increase Strength and Stamina;Understanding of Exercise Prescription   Comments Reviewed RPE and dyspnea scale, THR and program prescription with pt today.  Pt voiced understanding and was given a copy of goals to take home. Trey Paula is off to a good start in the program. He has done well on the treadmill at a speed of 2.6 mph with an incline  of 1%. He also has tolerated the elliptical at level 1 and the biostep at level 2. He used 4 lb hand weights for resistance training as well. We will continue to monitor his progress in the program. Trey Paula is doing well with exercise here at rehab, working on increasing eliptical to level 3 today. He reports he hasnt been working out home, but wants to set new years resulotion to begin walking some at home. Encouraged him to stay active increase workload here at rehab as able. Trey Paula is doing well in the program. He continues to walk the treadmill at a speed of 2.6 mph with an incline of 1%. He also has stayed consistent at level 1 on the elliptical. We will contineu to monitor his progress in the  program. Trey Paula continues to do well in rehab. He was able to increase his speed on the treadmill from 2. to . He was also able to increase to level 3 on the elliptical. We will continue to monitor his progress in the program.   Expected Outcomes Short: Use RPE daily to regulate intensity.  Long: Follow program prescription in THR. Short: Continue to follow current exercise prescription. Long: Continue exercise to improve strength and stamina. STG: continue to follow current exercise prescription and begin walking at home. LTG: exercise independently to improve strength and stamina Short: Begin to progressively increase treadmill workload. Long: Continue exercise to improve strength and stamina. Short: Continue to increase workloads. Long: Continue exercise to improve strength and stamina.    Row Name 01/16/24 1547 01/24/24 0825 02/07/24 0823 02/21/24 1506 03/03/24 1553     Exercise Goal Re-Evaluation   Exercise Goals Review Increase Physical Activity;Able to understand and use rate of perceived exertion (RPE) scale;Knowledge and understanding of Target Heart Rate Range (THRR);Understanding of Exercise Prescription;Increase Strength and Stamina;Able to understand and use Dyspnea scale;Able to check pulse independently Increase Physical Activity;Increase Strength and Stamina;Understanding of Exercise Prescription Increase Physical Activity;Increase Strength and Stamina;Understanding of Exercise Prescription Increase Physical Activity;Increase Strength and Stamina;Understanding of Exercise Prescription Increase Physical Activity;Increase Strength and Stamina;Understanding of Exercise Prescription   Comments Reviewed home exercise with pt today.  Pt plans to walk at home for exercise.  Reviewed THR, pulse, RPE, sign and symptoms, pulse oximetery and when to call 911 or MD.  Also discussed weather considerations and indoor options.  Pt voiced understanding. Trey Paula is doing well in the program. He was able to  maintain his workload on the elliptical at level 3 and a speed of 3.36mph. He did slightly decrease his speed on the treadmill from to 2.48mph, we will try to encourage him to return to previous workloads. We will continue to monitor his progress in the program. Trey Paula continues to do well in the program. He recently increased his treadmill workload back up to a speed of 3 mph with a 1% incline. He also continues to work at level 3 on both the elliptical and the biostep. He has continued to use 4 lb hand weights for resistance training but may benefit from increasing to 6 lb hand weights. We will continue to monitor his progress in the program. Trey Paula continues to do well in the program. He continues to walk the treadmill at a speed of 3 mph with a 1% incline. He did improve to level 5 on the elliptical with a speed of 3.6 mph. We will continue to monitor his progress in the program. Trey Paula is doing well here at the program. Encouraged him to work on  increasing workload. At home he is walking outdoors when weather is nice.   Expected Outcomes Short: add 1-2 days a week of walking at home on off days of rehab. Long: become independent with exercise routine. Short: Increase treadmill speed back to . Long: Continue exercise to increase strength and stamina. Short: Try 6 lb hand weights for resistance training. Long: Continue to increase overall METs and stamina. Short: Begin to progressively increase treadmill workload once again. Long: Continue to increase overall METs and stamina. STG: Increase workload here at Rehab. LTG: continue to increase overall METs and stamina    Row Name 03/06/24 1158             Exercise Goal Re-Evaluation   Exercise Goals Review Increase Physical Activity;Increase Strength and Stamina;Understanding of Exercise Prescription       Comments Trey Paula is doing well in rehab and is coming due for his post . He did decrease his treadmill workload to a speed of 2.8 mph with a 1%  incline. However, he did improve on the elliptical to level 6. We will continue to monitor his progress in the program.       Expected Outcomes Short: Improve on post . Long: Continue exercise to improve strength and stamina.                Discharge Exercise Prescription (Final Exercise Prescription Changes):  Exercise Prescription Changes - 03/06/24 1100       Response to Exercise   Blood Pressure (Admit) 140/70    Blood Pressure (Exercise) 190/96    Blood Pressure (Exit) 142/70    Heart Rate (Admit) 99 bpm    Heart Rate (Exercise) 103 bpm    Heart Rate (Exit) 58 bpm    Rating of Perceived Exertion (Exercise) 12    Symptoms none    Duration Continue with 30 min of aerobic exercise without signs/symptoms of physical distress.    Intensity THRR unchanged      Progression   Progression Continue to progress workloads to maintain intensity without signs/symptoms of physical distress.    Average METs 3.29      Resistance Training   Training Prescription Yes    Weight 4 lb    Reps 10-15      Interval Training   Interval Training No      Treadmill   MPH 2.8    Grade 1    Minutes 15    METs 3.53      Elliptical   Level 6    Speed 4    Minutes 15    METs 3      Home Exercise Plan   Plans to continue exercise at Home (comment)   walking at home   Frequency Add 2 additional days to program exercise sessions.    Initial Home Exercises Provided 01/16/24      Oxygen   Maintain Oxygen Saturation 88% or higher             Nutrition:  Target Goals: Understanding of nutrition guidelines, daily intake of sodium 1500mg , cholesterol 200mg , calories 30% from fat and 7% or less from saturated fats, daily to have 5 or more servings of fruits and vegetables.  Education: All About Nutrition: -Group instruction provided by verbal, written material, interactive activities, discussions, models, and posters to present general guidelines for heart healthy nutrition  including fat, fiber, MyPlate, the role of sodium in heart healthy nutrition, utilization of the nutrition label, and utilization of this knowledge for meal  planning. Follow up email sent as well. Written material given at graduation.   Biometrics:  Pre Biometrics - 11/28/23 1237       Pre Biometrics   Height 5\' 11"  (1.803 m)    Weight 220 lb 4.8 oz (99.9 kg)    Waist Circumference 44 inches    Hip Circumference 42 inches    Waist to Hip Ratio 1.05 %    BMI (Calculated) 30.74    Single Leg Stand 30 seconds             Post Biometrics - 03/05/24 1559        Post  Biometrics   Height 5\' 11"  (1.803 m)    Weight 218 lb 12.8 oz (99.2 kg)    Waist Circumference 43.25 inches    Hip Circumference 41 inches    Waist to Hip Ratio 1.05 %    BMI (Calculated) 30.53    Single Leg Stand 30 seconds             Nutrition Therapy Plan and Nutrition Goals:  Nutrition Therapy & Goals - 11/28/23 1345       Nutrition Therapy   Diet Carc controlled, cardiac, low Na    Protein (specify units) 90    Fiber 30 grams    Whole Grain Foods 3 servings    Saturated Fats 15 max. grams    Fruits and Vegetables 5 servings/day    Sodium 2 grams      Personal Nutrition Goals   Nutrition Goal Read labels and reduce sodium intake to below 2300mg . Ideally 1500mg  per day.    Personal Goal #2 Reduce saturated fat, less than 12g per day. Replace bad fats for more heart healthy fats.    Personal Goal #3 Eat 15-30gProtein and 30-60gCarbs at each meal.    Comments Patient drinking mostly diet dr pepper. Switched from regularly sodas to diet, commended him on the change and encouraged him to continue to choose non sugary beverages. His A1C appears to have improved drastically from ~7% to 5.4%. He has never counted carbs, but tried to limit starchy foods. Spoke with him about quality of carbs, encouraging more whole grains and natural foods rather than refined carb choices. He likes whole grain breads and  brown rice. Also discussed carb quantity and target of ~30-60g per meal. Reviewed Mediterranean diet handout, educated on types of fats, sources, and how to read label. Went over several of his favorite foods facts label and recommended changes when needed. Set goal to read labels and watch sodium more carefully. Built out several meals and snacks with foods he likes and will eat focusing on less sodium and saturated fat with more balance between complex carbs and healthy protein or fat      Intervention Plan   Intervention Prescribe, educate and counsel regarding individualized specific dietary modifications aiming towards targeted core components such as weight, hypertension, lipid management, diabetes, heart failure and other comorbidities.;Nutrition handout(s) given to patient.    Expected Outcomes Short Term Goal: Understand basic principles of dietary content, such as calories, fat, sodium, cholesterol and nutrients.;Short Term Goal: A plan has been developed with personal nutrition goals set during dietitian appointment.;Long Term Goal: Adherence to prescribed nutrition plan.             Nutrition Assessments:  MEDIFICTS Score Key: >=70 Need to make dietary changes  40-70 Heart Healthy Diet <= 40 Therapeutic Level Cholesterol Diet  Flowsheet Row Cardiac Rehab from [facility]/13/2024 [facility] Card[facility]  P[facility] Rehab  Picture Your Plate Total Score on Discharge 63      Picture Your Plate Scores: <62 Unhealthy dietary pattern with much room for improvement. 41-50 Dietary pattern unlikely to meet recommendations for good health and room for improvement. 51-60 More healthful dietary pattern, with some room for improvement.  >60 Healthy dietary pattern, although there may be some specific behaviors that could be improved.    Nutrition Goals Re-Evaluation:  Nutrition Goals Re-Evaluation     Row Name 12/24/23 1544 01/16/24 1550 03/03/24 1549         Goals   Nutrition Goal -- Read  labels and reduce sodium intake to below 2300mg . Ideally 1500mg  per day. --     Comment He is looking forward to 2025, feels the holidays were tough for him to follow good nutrtion goals. Says he had lots of comfort holiday food in larger portion. He is happy that he has cut back on sugary beverages, but feels he hasnt done well with replacing the sugary beverages with water. Set goal to drink more water in place of sugary beverages. Trey Paula states he has a hard time with health eating though the holidays but is back to his normal dietary habits and is trying to make good nutrition choices. He is reading food labels and monitoring his sodium intake. He states he tries to use a little salt as possible. He continues to reduce sugary beverages but is still not replacing them with enough water. He is trying to remember to drink more water, even though this is a struggle. Trey Paula says he is working on healthy eating but some days he misses the mark and over eats or make a poor choice. He had a anniversary and ate more comfort foods putting on some weight. Discussed the importance of low caloire nutrient dense foods to add to comfort foods.     Expected Outcome STG: practice portion control, choose low calorie dense foods, drink plenty of water. LTG: Follow heart healthy lifestyle and maintain healthy weight Short: continue to work on adding more water into diet to replace sugary beverages, and continue to read food labels and monitor sodium intake. Long: maintain heart healthy diet. STG: include more colorful produce to meals. LTG: follow a heart healthy lifestyle              Nutrition Goals Discharge (Final Nutrition Goals Re-Evaluation):  Nutrition Goals Re-Evaluation - 03/03/24 1549       Goals   Comment Trey Paula says he is working on healthy eating but some days he misses the mark and over eats or make a poor choice. He had a anniversary and ate more comfort foods putting on some weight. Discussed the  importance of low caloire nutrient dense foods to add to comfort foods.    Expected Outcome STG: include more colorful produce to meals. LTG: follow a heart healthy lifestyle             Psychosocial: Target Goals: Acknowledge presence or absence of significant depression and/or stress, maximize coping skills, provide positive support system. Participant is able to verbalize types and ability to use techniques and skills needed for reducing stress and depression.   Education: Stress, Anxiety, and Depression - Group verbal and visual presentation to define topics covered.  Reviews how body is impacted by stress, anxiety, and depression.  Also discusses healthy ways to reduce stress and to treat/manage anxiety and depression.  Written material given at graduation. Flowsheet Row Cardiac Rehab from 03/12/2024 in  [facility] Cardiac and Pulmonary Rehab  Education need identified 11/28/23  Date 02/20/24  Educator [facility]  Instruction Review Code 1- [employer] Understanding       Education: Sleep Hygiene -Provides group verbal and written instruction about how sleep can affect your health.  Define sleep hygiene, discuss sleep cycles and impact of sleep habits. Review good sleep hygiene tips.    Initial Review & Psychosocial Screening:  Initial Psych Review & Screening - 11/27/23 1410       Initial Review   Current issues with History of Depression      Family Dynamics   Good Support System? Yes   wife, family     Barriers   Psychosocial barriers to participate in program There are no identifiable barriers or psychosocial needs.;The patient should benefit from training in stress management and relaxation.      Screening Interventions   Interventions Encouraged to exercise;Provide feedback about the scores to participant;To provide support and resources with identified psychosocial needs    Expected Outcomes Short Term goal: Utilizing psychosocial counselor, staff and physician to assist with  identification of specific Stressors or current issues interfering with healing process. Setting desired goal for each stressor or current issue identified.;Long Term Goal: Stressors or current issues are controlled or eliminated.;Short Term goal: Identification and review with participant of any Quality of Life or Depression concerns found by scoring the questionnaire.;Long Term goal: The participant improves quality of Life and PHQ9 Scores as seen by post scores and/or verbalization of changes             Quality of Life Scores:   Quality of Life - 03/13/24 1534       Quality of Life   Select Quality of Life      Quality of Life Scores   Health/Function Pre 25.29 %    Health/Function Post 23.27 %    Health/Function % Change -7.99 %    Socioeconomic Pre 26.5 %    Socioeconomic Post 21.75 %    Socioeconomic % Change  -17.92 %    Psych/Spiritual Pre 28.29 %    Psych/Spiritual Post 30 %    Psych/Spiritual % Change 6.04 %    Family Pre 28 %    Family Post 23.4 %    Family % Change -16.43 %    GLOBAL Pre 26.5 %    GLOBAL Post 24.29 %    GLOBAL % Change -8.34 %            Scores of 19 and below usually indicate a poorer quality of life in these areas.  A difference of  2-3 points is a clinically meaningful difference.  A difference of 2-3 points in the total score of the Quality of Life Index has been associated with significant improvement in overall quality of life, self-image, physical symptoms, and general health in studies assessing change in quality of life.  PHQ-9: Review Flowsheet  More data exists      03/05/2024 01/16/2024 11/28/2023 10/18/2020 02/17/2020  Depression screen PHQ 2/9  Decreased Interest 0 0 1 0 1  Down, Depressed, Hopeless 0 0 0 - 0  PHQ - 2 Score 0 0 1 0 1  Altered sleeping 0 0 1 - 0  Tired, decreased energy 1 0 2 - 2  Change in appetite 0 0 1 - 0  Feeling bad or failure about yourself  0 0 0 - 0  Trouble concentrating 0 0 1 - 0  Moving slowly or  fidgety/restless 0  0 0 - 0  Suicidal thoughts 0 0 0 - 0  PHQ-9 Score 1 0 6 - 3  Difficult doing work/chores Not difficult at all Not difficult at all Not difficult at all - Not difficult at all   Interpretation of Total Score  Total Score Depression Severity:  1-4 = Minimal depression, 5-9 = Mild depression, 10-14 = Moderate depression, 15-19 = Moderately severe depression, 20-27 = Severe depression   Psychosocial Evaluation and Intervention:  Psychosocial Evaluation - 11/27/23 1425       Psychosocial Evaluation & Interventions   Interventions Encouraged to exercise with the program and follow exercise prescription    Comments Trey Paula is coming to cardiac rehab after a NSTEMI and stent. He states he is feeling well and just returned to work last week at the [employer]. He has a history of depression which is managed at this time with no concerns. He does report sleep concerns. He falls asleep okay, but then wakes in a few hours. We discussed asking his doctor about medication and proper sleep hygiene. He has a supportive wife and family. He is looking forward to attending the program to work on his stamina and strength    Expected Outcomes Short: attend cardiac rehab for education and exercise. Long: develop and maintain positive self care habits    Continue Psychosocial Services  Follow up required by staff             Psychosocial Re-Evaluation:  Psychosocial Re-Evaluation     Row Name 12/24/23 1542 01/16/24 1549 03/03/24 1551         Psychosocial Re-Evaluation   Current issues with Current Stress Concerns Current Stress Concerns None Identified     Comments He is doing well with sleep the first 4hrs, then wakes up and struggles to sleep the last few hours. Reports he feels well rested when starting his days. He goes to sleep early to make sure he gets good rest. He reports no stressors, anxiety or depression Trey Paula reports that he has no new concerns or changes in sleep patterns,  stress, or mental health. He continues to attend cardiac rehab the provides mental health and physical health benefits. Trey Paula states he is not experiencing any stress, anxiety, or depression at this time. Has a good support system. e sleeps well, currently a little sick so hasnt slept well this past few days but says mostly he sleeps well.     Expected Outcomes STG: go to bed early,, ensure good rest. LTG: maintain positive outlook on health outcomes and daily life Short: be consistent in good sleeping patterns. Long: maintain good mental health habits. STG: Continue to use support system as needed. LTG: maintain and positive outlook on health and daily life     Interventions Encouraged to attend Cardiac Rehabilitation for the exercise Encouraged to attend Cardiac Rehabilitation for the exercise Encouraged to attend Cardiac Rehabilitation for the exercise     Continue Psychosocial Services  Follow up required by staff Follow up required by staff Follow up required by staff              Psychosocial Discharge (Final Psychosocial Re-Evaluation):  Psychosocial Re-Evaluation - 03/03/24 1551       Psychosocial Re-Evaluation   Current issues with None Identified    Comments Trey Paula states he is not experiencing any stress, anxiety, or depression at this time. Has a good support system. e sleeps well, currently a little sick so hasnt slept well this past few days but  says mostly he sleeps well.    Expected Outcomes STG: Continue to use support system as needed. LTG: maintain and positive outlook on health and daily life    Interventions Encouraged to attend Cardiac Rehabilitation for the exercise    Continue Psychosocial Services  Follow up required by staff             Vocational Rehabilitation: Provide vocational rehab assistance to qualifying candidates.   Vocational Rehab Evaluation & Intervention:  Vocational Rehab - 11/27/23 1409       Initial Vocational Rehab Evaluation & Intervention    Assessment shows need for Vocational Rehabilitation No             Education: Education Goals: Education classes will be provided on a variety of topics geared toward better understanding of heart health and risk factor modification. Participant will state understanding/return demonstration of topics presented as noted by education test scores.  Learning Barriers/Preferences:  Learning Barriers/Preferences - 11/27/23 1409       Learning Barriers/Preferences   Learning Barriers None    Learning Preferences None             General Cardiac Education Topics:  AED/CPR: - Group verbal and written instruction with the use of models to demonstrate the basic use of the AED with the basic ABC's of resuscitation.   Anatomy and Cardiac Procedures: - Group verbal and visual presentation and models provide information about basic cardiac anatomy and function. Reviews the testing methods done to diagnose heart disease and the outcomes of the test results. Describes the treatment choices: Medical Management, Angioplasty, or Coronary Bypass Surgery for treating various heart conditions including Myocardial Infarction, Angina, Valve Disease, and Cardiac Arrhythmias.  Written material given at graduation.   Medication Safety: - Group verbal and visual instruction to review commonly prescribed medications for heart and lung disease. Reviews the medication, class of the drug, and side effects. Includes the steps to properly store meds and maintain the prescription regimen.  Written material given at graduation.   Intimacy: - Group verbal instruction through game format to discuss how heart and lung disease can affect sexual intimacy. Written material given at graduation.. Flowsheet Row Cardiac Rehab from 03/12/2024 in [facility] Cardiac and Pulmonary Rehab  Date 03/12/24  Educator [facility]  Instruction Review Code 1- Verbalizes Understanding       Know Your Numbers and Heart Failure: - Group  verbal and visual instruction to discuss disease risk factors for cardiac and pulmonary disease and treatment options.  Reviews associated critical values for Overweight/Obesity, Hypertension, Cholesterol, and Diabetes.  Discusses basics of heart failure: signs/symptoms and treatments.  Introduces Heart Failure Zone chart for action plan for heart failure.  Written material given at graduation.   Infection Prevention: - Provides verbal and written material to individual with discussion of infection control including proper hand washing and proper equipment cleaning during exercise session. Flowsheet Row Cardiac Rehab from 03/12/2024 in [facility] Cardiac and Pulmonary Rehab  Date 11/28/23  Educator [facility]  Instruction Review Code 1- Verbalizes Understanding       Falls Prevention: - Provides verbal and written material to individual with discussion of falls prevention and safety. Flowsheet Row Cardiac Rehab from 03/12/2024 in [facility] Cardiac and Pulmonary Rehab  Date 11/28/23  Educator [facility]  Instruction Review Code 1- Verbalizes Understanding       Other: -Provides group and verbal instruction on various topics (see comments)   Knowledge Questionnaire Score:  Knowledge Questionnaire Score - 03/13/24 1534  Knowledge Questionnaire Score   Post Score 23/26             Core Components/Risk Factors/Patient Goals at Admission:  Personal Goals and Risk Factors at Admission - 11/28/23 1238       Core Components/Risk Factors/Patient Goals on Admission    Weight Management Yes    Intervention Weight Management: Develop a combined nutrition and exercise program designed to reach desired caloric intake, while maintaining appropriate intake of nutrient and fiber, sodium and fats, and appropriate energy expenditure required for the weight goal.;Weight Management: Provide education and appropriate resources to help participant work on and attain dietary goals.;Weight Management/Obesity: Establish  reasonable short term and long term weight goals.    Admit Weight 220 lb 4.8 oz (99.9 kg)    Goal Weight: Short Term 210 lb (95.3 kg)    Goal Weight: Long Term 190 lb (86.2 kg)    Expected Outcomes Short Term: Continue to assess and modify interventions until short term weight is achieved;Long Term: Adherence to nutrition and physical activity/exercise program aimed toward attainment of established weight goal;Weight Loss: Understanding of general recommendations for a balanced deficit meal plan, which promotes 1-2 lb weight loss per week and includes a negative energy balance of [phone] kcal/d;Understanding recommendations for meals to include 15-35% energy as protein, 25-35% energy from fat, 35-60% energy from carbohydrates, less than 200mg  of dietary cholesterol, 20-35 gm of total fiber daily;Understanding of distribution of calorie intake throughout the day with the consumption of 4-5 meals/snacks    Diabetes Yes    Intervention Provide education about signs/symptoms and action to take for hypo/hyperglycemia.;Provide education about proper nutrition, including hydration, and aerobic/resistive exercise prescription along with prescribed medications to achieve blood glucose in normal ranges: Fasting glucose 65-99 mg/dL    Expected Outcomes Short Term: Participant verbalizes understanding of the signs/symptoms and immediate care of hyper/hypoglycemia, proper foot care and importance of medication, aerobic/resistive exercise and nutrition plan for blood glucose control.;Long Term: Attainment of HbA1C < 7%.    Hypertension Yes    Intervention Provide education on lifestyle modifcations including regular physical activity/exercise, weight management, moderate sodium restriction and increased consumption of fresh fruit, vegetables, and low fat dairy, alcohol moderation, and smoking cessation.;Monitor prescription use compliance.    Expected Outcomes Short Term: Continued assessment and intervention until BP  is < 140/48mm HG in hypertensive participants. < 130/75mm HG in hypertensive participants with diabetes, heart failure or chronic kidney disease.;Long Term: Maintenance of blood pressure at goal levels.    Lipids Yes    Intervention Provide education and support for participant on nutrition & aerobic/resistive exercise along with prescribed medications to achieve LDL 70mg , HDL >40mg .    Expected Outcomes Short Term: Participant states understanding of desired cholesterol values and is compliant with medications prescribed. Participant is following exercise prescription and nutrition guidelines.;Long Term: Cholesterol controlled with medications as prescribed, with individualized exercise RX and with personalized nutrition plan. Value goals: LDL < 70mg , HDL > 40 mg.             Education:Diabetes - Individual verbal and written instruction to review signs/symptoms of diabetes, desired ranges of glucose level fasting, after meals and with exercise. Acknowledge that pre and post exercise glucose checks will be done for 3 sessions at entry of program. Flowsheet Row Cardiac Rehab from 03/12/2024 in [facility] Cardiac and Pulmonary Rehab  Date 11/28/23  Educator [facility]  Instruction Review Code 1- Verbalizes Understanding       Core Components/Risk Factors/Patient Goals Review:  Goals and Risk Factor Review     Row Name 12/24/23 1548 01/16/24 1554 03/03/24 1547         Core Components/Risk Factors/Patient Goals Review   Personal Goals Review Weight Management/Obesity Weight Management/Obesity;Lipids;Hypertension Weight Management/Obesity;Hypertension     Review He is a little disappointed he put on ~5-8lbs over the holidays but is confident he can lose that weight again once the new year starts and he is more ontop of his health goals. Spoke with him about cutting back on sugary beverages and including more low caloire foods with water and fiber. Trey Paula reports that he has maintained weight since the  holidays but is working on picking up his activity levels to try to lose the weight he gained over the holidays. He reports that he does have a blood pressure cuff at home but does not consistently check his blood pressure. He was encouraged to start getting into the habit of checking BP at home. He does report that he takes all cholesterol and blood pressure meds as prescribed and does not have any concerns with managing his medication. Trey Paula is looking to lose weight, reports he had a anniversary and put on some weights. He reports he he checks his BP and BS some but not like he should. Set goal today to be more consistent with checking BP.     Expected Outcomes Short: lose 5-10lbs and cut back on sugary beverages. LTG: maintain healthy weight and hydrate well independently Short: start checking BP at home and work towards losing 5-10 lbs of weight. Long: control caridac risk factors STG: check BP at home more consisitently. LTG: control cardiac risk factors              Core Components/Risk Factors/Patient Goals at Discharge (Final Review):   Goals and Risk Factor Review - 03/03/24 1547       Core Components/Risk Factors/Patient Goals Review   Personal Goals Review Weight Management/Obesity;Hypertension    Review Trey Paula is looking to lose weight, reports he had a anniversary and put on some weights. He reports he he checks his BP and BS some but not like he should. Set goal today to be more consistent with checking BP.    Expected Outcomes STG: check BP at home more consisitently. LTG: control cardiac risk factors             ITP Comments:  ITP Comments     Row Name 11/27/23 1423 11/28/23 1229 12/03/23 1647 12/12/23 1158 01/09/24 1317   ITP Comments Initial phone call completed. Diagnosis can be found in [facility] 11/8. EP Orientation scheduled for Wednesday 12/4 at 10:30. Completed and gym orientation. Initial ITP created and sent for review to Dr. Bethann Punches, Medical Director. First  full day of exercise!  Patient was oriented to gym and equipment including functions, settings, policies, and procedures.  Patient's individual exercise prescription and treatment plan were reviewed.  All starting workloads were established based on the results of the 6 minute walk test done at initial orientation visit.  The plan for exercise progression was also introduced and progression will be customized based on patient's performance and goals. 30 Day review completed. Medical Director ITP review done, changes made as directed, and signed approval by Medical Director.    new to program 30 Day review completed. Medical Director ITP review done, changes made as directed, and signed approval by Medical Director.    new to program    Row Name 02/06/24 0840 03/05/24 0849 03/13/24  1541       ITP Comments 30 Day review completed. Medical Director ITP review done, changes made as directed, and signed approval by Medical Director. 30 Day review completed. Medical Director ITP review done, changes made as directed, and signed approval by Medical Director. Mahkai graduated today from  rehab with 36 sessions completed.  Details of the patient's exercise prescription and what He needs to do in order to continue the prescription and progress were discussed with patient.  Patient was given a copy of prescription and goals.  Patient verbalized understanding. Jurrell plans to continue to exercise by walking at home .              Comments: Discharge ITP

## 2024-05-01 ENCOUNTER — Encounter: Payer: Self-pay | Admitting: Physician Assistant

## 2024-05-02 DIAGNOSIS — Z789 Other specified health status: Secondary | ICD-10-CM | POA: Diagnosis not present

## 2024-05-02 DIAGNOSIS — I2510 Atherosclerotic heart disease of native coronary artery without angina pectoris: Secondary | ICD-10-CM | POA: Diagnosis not present

## 2024-05-02 DIAGNOSIS — E782 Mixed hyperlipidemia: Secondary | ICD-10-CM | POA: Diagnosis not present

## 2024-05-02 DIAGNOSIS — I10 Essential (primary) hypertension: Secondary | ICD-10-CM | POA: Diagnosis not present

## 2024-05-02 DIAGNOSIS — T466X5A Adverse effect of antihyperlipidemic and antiarteriosclerotic drugs, initial encounter: Secondary | ICD-10-CM | POA: Diagnosis not present

## 2024-05-02 DIAGNOSIS — G4733 Obstructive sleep apnea (adult) (pediatric): Secondary | ICD-10-CM | POA: Diagnosis not present

## 2024-05-07 DIAGNOSIS — M2241 Chondromalacia patellae, right knee: Secondary | ICD-10-CM | POA: Diagnosis not present

## 2024-05-07 DIAGNOSIS — M2242 Chondromalacia patellae, left knee: Secondary | ICD-10-CM | POA: Diagnosis not present

## 2024-05-09 DIAGNOSIS — G4731 Primary central sleep apnea: Secondary | ICD-10-CM | POA: Diagnosis not present

## 2024-05-09 DIAGNOSIS — G4733 Obstructive sleep apnea (adult) (pediatric): Secondary | ICD-10-CM | POA: Diagnosis not present

## 2024-05-21 DIAGNOSIS — M2242 Chondromalacia patellae, left knee: Secondary | ICD-10-CM | POA: Diagnosis not present

## 2024-05-21 DIAGNOSIS — M2241 Chondromalacia patellae, right knee: Secondary | ICD-10-CM | POA: Diagnosis not present

## 2024-05-23 DIAGNOSIS — I10 Essential (primary) hypertension: Secondary | ICD-10-CM | POA: Diagnosis not present

## 2024-06-02 ENCOUNTER — Ambulatory Visit: Payer: Self-pay

## 2024-06-02 DIAGNOSIS — Z0000 Encounter for general adult medical examination without abnormal findings: Secondary | ICD-10-CM

## 2024-06-02 DIAGNOSIS — E1169 Type 2 diabetes mellitus with other specified complication: Secondary | ICD-10-CM

## 2024-06-02 LAB — POCT URINALYSIS DIPSTICK
Bilirubin, UA: NEGATIVE
Blood, UA: NEGATIVE
Glucose, UA: NEGATIVE
Ketones, UA: NEGATIVE
Leukocytes, UA: NEGATIVE
Nitrite, UA: NEGATIVE
Protein, UA: NEGATIVE
Spec Grav, UA: 1.02 (ref 1.010–1.025)
Urobilinogen, UA: 0.2 U/dL
pH, UA: 5.5 (ref 5.0–8.0)

## 2024-06-02 NOTE — Progress Notes (Signed)
 Fasting labs, UA and EKG completed pre physical.

## 2024-06-03 DIAGNOSIS — Z7189 Other specified counseling: Secondary | ICD-10-CM | POA: Diagnosis not present

## 2024-06-03 DIAGNOSIS — R233 Spontaneous ecchymoses: Secondary | ICD-10-CM | POA: Diagnosis not present

## 2024-06-03 DIAGNOSIS — D2271 Melanocytic nevi of right lower limb, including hip: Secondary | ICD-10-CM | POA: Diagnosis not present

## 2024-06-03 DIAGNOSIS — L821 Other seborrheic keratosis: Secondary | ICD-10-CM | POA: Diagnosis not present

## 2024-06-03 DIAGNOSIS — L738 Other specified follicular disorders: Secondary | ICD-10-CM | POA: Diagnosis not present

## 2024-06-03 DIAGNOSIS — L905 Scar conditions and fibrosis of skin: Secondary | ICD-10-CM | POA: Diagnosis not present

## 2024-06-03 DIAGNOSIS — L814 Other melanin hyperpigmentation: Secondary | ICD-10-CM | POA: Diagnosis not present

## 2024-06-03 DIAGNOSIS — D225 Melanocytic nevi of trunk: Secondary | ICD-10-CM | POA: Diagnosis not present

## 2024-06-03 LAB — CMP12+LP+TP+TSH+6AC+PSA+CBC…
ALT: 22 IU/L (ref 0–44)
AST: 22 IU/L (ref 0–40)
Albumin: 4.8 g/dL (ref 3.9–4.9)
Alkaline Phosphatase: 45 IU/L (ref 44–121)
BUN/Creatinine Ratio: 19 (ref 10–24)
BUN: 25 mg/dL (ref 8–27)
Basophils Absolute: 0.1 10*3/uL (ref 0.0–0.2)
Basos: 1 %
Bilirubin Total: 0.4 mg/dL (ref 0.0–1.2)
Calcium: 9.9 mg/dL (ref 8.6–10.2)
Chloride: 97 mmol/L (ref 96–106)
Chol/HDL Ratio: 1.8 ratio (ref 0.0–5.0)
Cholesterol, Total: 71 mg/dL — ABNORMAL LOW (ref 100–199)
Creatinine, Ser: 1.3 mg/dL — ABNORMAL HIGH (ref 0.76–1.27)
EOS (ABSOLUTE): 0.2 10*3/uL (ref 0.0–0.4)
Eos: 3 %
Estimated CHD Risk: <0.5 times avg. (ref 0.0–1.0)
Free Thyroxine Index: 2 (ref 1.2–4.9)
GGT: 16 IU/L (ref 0–65)
Globulin, Total: 1.8 g/dL (ref 1.5–4.5)
Glucose: 100 mg/dL — ABNORMAL HIGH (ref 70–99)
HDL: 40 mg/dL (ref >39)
Hematocrit: 42.3 % (ref 37.5–51.0)
Hemoglobin: 14.7 g/dL (ref 13.0–17.7)
Immature Grans (Abs): 0 10*3/uL (ref 0.0–0.1)
Immature Granulocytes: 0 %
Iron: 80 ug/dL (ref 38–169)
LDH: 145 IU/L (ref 121–224)
LDL Chol Calc (NIH): 9 mg/dL (ref 0–99)
Lymphocytes Absolute: 1.1 10*3/uL (ref 0.7–3.1)
Lymphs: 18 %
MCH: 31.8 pg (ref 26.6–33.0)
MCHC: 34.8 g/dL (ref 31.5–35.7)
MCV: 92 fL (ref 79–97)
Monocytes Absolute: 0.5 10*3/uL (ref 0.1–0.9)
Monocytes: 8 %
Neutrophils Absolute: 4.4 10*3/uL (ref 1.4–7.0)
Neutrophils: 69 %
Phosphorus: 3.5 mg/dL (ref 2.8–4.1)
Platelets: 179 10*3/uL (ref 150–450)
Potassium: 4 mmol/L (ref 3.5–5.2)
Prostate Specific Ag, Serum: 0.2 ng/mL (ref 0.0–4.0)
RBC: 4.62 x10E6/uL (ref 4.14–5.80)
RDW: 12.8 % (ref 11.6–15.4)
Sodium: 137 mmol/L (ref 134–144)
T3 Uptake Ratio: 25 % (ref 24–39)
T4, Total: 8.1 ug/dL (ref 4.5–12.0)
TSH: 1.42 u[IU]/mL (ref 0.450–4.500)
Total Protein: 6.6 g/dL (ref 6.0–8.5)
Triglycerides: 120 mg/dL (ref 0–149)
Uric Acid: 6.2 mg/dL (ref 3.8–8.4)
VLDL Cholesterol Cal: 22 mg/dL (ref 5–40)
WBC: 6.2 10*3/uL (ref 3.4–10.8)
eGFR: 63 mL/min/{1.73_m2} (ref >59)

## 2024-06-03 LAB — HGB A1C W/O EAG: Hgb A1c MFr Bld: 5.8 % — ABNORMAL HIGH (ref 4.8–5.6)

## 2024-06-05 ENCOUNTER — Encounter: Payer: Self-pay | Admitting: Physician Assistant

## 2024-06-05 ENCOUNTER — Ambulatory Visit: Payer: Self-pay | Admitting: Physician Assistant

## 2024-06-05 VITALS — BP 147/94 | HR 63 | Temp 97.6°F | Resp 16 | Ht 71.0 in | Wt 220.0 lb

## 2024-06-05 DIAGNOSIS — Z0000 Encounter for general adult medical examination without abnormal findings: Secondary | ICD-10-CM

## 2024-06-05 NOTE — Progress Notes (Signed)
 [facility] [place]    ____________________________________________   None    (approximate)  I have reviewed the triage vital signs and the nursing notes.   HISTORY  Chief Complaint No chief complaint on file.   HPI [name] is a 63 y.o. male patient presenting for annual physical exam.  Patient is followed by cardiology at [facility].  Patient is aware that his blood pressure is not optimal for his medical condition.         Past Medical History:  Diagnosis Date   Allergy    Depression    Diabetes mellitus without complication (HCC)    GERD (gastroesophageal reflux disease)    Heart murmur    Hyperlipidemia    Hypertension    Joint pain    Sleep apnea     Patient Active Problem List   Diagnosis Date Noted   Hypokalemia 11/06/2023   Hyponatremia 11/05/2023   Obesity (BMI 30-39.9) 11/03/2023   NSTEMI (non-ST elevated myocardial infarction) (HCC) 11/02/2023   Osteoarthritis of left shoulder 03/22/2023   History of shoulder surgery 03/22/2023   Depressive disorder 03/22/2023   Shoulder pain 03/22/2023   Sore throat 03/22/2023   Rupture of tendon of biceps, long head 01/12/2022   Full thickness rotator cuff tear 12/05/2021   Pneumonia due to COVID-19 virus 12/30/2020   Acute hypoxemic respiratory failure due to COVID-19 (HCC) 12/29/2020   Chest pressure 01/26/2020   Bilateral lower extremity edema 12/09/2019   Chronic left shoulder pain 12/09/2019   Generalized anxiety disorder 05/15/2019   Long-term use of aspirin  therapy 01/30/2019   Type 2 diabetes mellitus with hyperlipidemia (HCC) 11/19/2018   Hyperlipidemia, unspecified 11/19/2018   Hypertension 11/19/2018   Sleep apnea 11/19/2018    Past Surgical History:  Procedure Laterality Date   carpel tunnel     COLONOSCOPY WITH PROPOFOL  N/A 06/21/2015   Procedure: COLONOSCOPY WITH PROPOFOL ;  Surgeon: Deveron Fly, MD;  Location: [facility] ENDOSCOPY;  Service:  Endoscopy;  Laterality: N/A;   COLONOSCOPY WITH PROPOFOL  N/A 07/20/2015   Procedure: COLONOSCOPY WITH PROPOFOL ;  Surgeon: Deveron Fly, MD;  Location: [facility] ENDOSCOPY;  Service: Endoscopy;  Laterality: N/A;   CORONARY STENT INTERVENTION N/A 11/07/2023   Procedure: CORONARY STENT INTERVENTION;  Surgeon: Antonette Batters, MD;  Location: [facility] INVASIVE CV LAB;  Service: Cardiovascular;  Laterality: N/A;   CORONARY STENT INTERVENTION N/A 11/06/2023   Procedure: CORONARY STENT INTERVENTION;  Surgeon: Antonette Batters, MD;  Location: [facility] INVASIVE CV LAB;  Service: Cardiovascular;  Laterality: N/A;   LEFT HEART CATH AND CORONARY ANGIOGRAPHY N/A 11/06/2023   Procedure: LEFT HEART CATH AND CORONARY ANGIOGRAPHY;  Surgeon: Antonette Batters, MD;  Location: [facility] INVASIVE CV LAB;  Service: Cardiovascular;  Laterality: N/A;   WISDOM TOOTH EXTRACTION      Prior to Admission medications   Medication Sig Start Date End Date Taking? Authorizing Provider  amLODipine  (NORVASC ) 10 MG tablet Take 1 tablet (10 mg total) by mouth daily. 06/29/20   Reford Canterbury, MD  aspirin  81 MG chewable tablet Chew 1 tablet (81 mg total) by mouth daily. 11/09/23   Donaciano Frizzle, MD  atorvastatin  (LIPITOR ) 80 MG tablet Take 1 tablet (80 mg total) by mouth daily. 11/09/23   Donaciano Frizzle, MD  bisoprolol  (ZEBETA ) 5 MG tablet Take 0.5 tablets (2.5 mg total) by mouth daily. 11/09/23   Donaciano Frizzle, MD  Blood Glucose Monitoring Suppl (BLOOD GLUCOSE MONITOR SYSTEM) w/Device KIT Use to test blood sugar once daily  04/06/20   Jenean Minus, MD  fenofibrate  (TRICOR ) 145 MG tablet Take 1 tablet (145 mg total) by mouth daily. 01/26/20   Reford Canterbury, MD  glucose blood (CONTOUR NEXT TEST) test strip Check sugar once daily. DX E11.9 02/05/20   Reford Canterbury, MD  hydrochlorothiazide  (HYDRODIURIL ) 25 MG tablet Take 25 mg by mouth daily. 10/29/23   [provider]  Lancets MISC 1 each by Does not apply route daily as needed. 12/09/19    Reford Canterbury, MD  levocetirizine (XYZAL ) 5 MG tablet Take 5 mg by mouth every evening. Patient not taking: Reported on 11/19/2023    [provider]  metFORMIN  (GLUCOPHAGE ) 1000 MG tablet Take 1 tablet (1,000 mg total) by mouth 2 (two) times daily. 11/29/20   Reford Canterbury, MD  MOUNJARO 7.5 MG/0.5ML Pen Inject 7.5 mg into the skin once a week. Sunday 10/22/23   [provider]  NON FORMULARY CPAP MACHINE USING EVERY NIGHT setting at 10    [provider]  omeprazole (PRILOSEC) 40 MG capsule Take by mouth. Patient not taking: Reported on 11/19/2023 10/30/23 10/29/24  [provider]  PARoxetine  (PAXIL ) 20 MG tablet TAKE 3 TABLETS BY MOUTH ONCE DAILY Patient taking differently: Take 60 mg by mouth daily. 07/08/20   Reford Canterbury, MD  telmisartan (MICARDIS) 80 MG tablet Take 80 mg by mouth daily. 10/29/23   [provider]  ticagrelor  (BRILINTA ) 90 MG TABS tablet Take 1 tablet (90 mg total) by mouth 2 (two) times daily. 11/08/23   Donaciano Frizzle, MD    Allergies Codeine and Simvastatin   Family History  Problem Relation Age of Onset   Prostate cancer Father 40   Hyperlipidemia Father    Cancer Maternal Grandmother    Heart attack Maternal Grandfather    Stroke Paternal Grandmother        old   Stomach cancer Paternal Grandfather     Social History Social History   Tobacco Use   Smoking status: Former    Current packs/day: 0.00    Average packs/day: 1 pack/day for 8.0 years (8.0 ttl pk-yrs)    Types: Cigarettes    Start date: 04/24/2010    Quit date: 04/24/2018    Years since quitting: 6.1   Smokeless tobacco: Former    Types: Chew    Quit date: 11/19/2013  Vaping Use   Vaping status: Never Used  Substance Use Topics   Alcohol use: Yes    Comment: not regularly currently   Drug use: No    Review of Systems Constitutional: No fever/chills Eyes: No visual changes. ENT: No sore throat. Cardiovascular: Denies chest pain. Respiratory:  Denies shortness of breath. Gastrointestinal: No abdominal pain.  No nausea, no vomiting.  No diarrhea.  No constipation. Genitourinary: Negative for dysuria. Musculoskeletal: Negative for back pain. Skin: Negative for rash. Neurological: Negative for headaches, focal weakness or numbness. Psychiatric: Anxiety/depression Endocrine: Diabetes, hyperlipidemia, and hypertension Allergic/Immunilogical: Codeine and simvastatin  ____________________________________________   PHYSICAL EXAM:  VITAL SIGNS: BP 147/94  Cuff Size Large  Pulse Rate 63  Temp 97.6 F (36.4 C)  Temp Source Temporal  Weight 220 lb (99.8 kg)  Height 5' 11 (1.803 m)  Resp 16  SpO2 95 %   BMI: 30.68 kg/m2  BSA: 2.24 m2   Constitutional: Alert and oriented. Well appearing and in no acute distress. Eyes: Conjunctivae are normal. PERRL. EOMI. Head: Atraumatic. Nose: No congestion/rhinnorhea. Mouth/Throat: Mucous membranes are moist.  Oropharynx non-erythematous. Neck: No stridor. No cervical spine tenderness to  palpation. Hematological/Lymphatic/Immunilogical: No cervical lymphadenopathy. Cardiovascular: Normal rate, regular rhythm. Grossly normal heart sounds.  Good peripheral circulation. Respiratory: Normal respiratory effort.  No retractions. Lungs CTAB. Gastrointestinal: Soft and nontender. No distention. No abdominal bruits. No CVA tenderness. Genitourinary: Deferred Musculoskeletal: No lower extremity tenderness nor edema.  No joint effusions. Neurologic:  Normal speech and language. No gross focal neurologic deficits are appreciated. No gait instability. Skin:  Skin is warm, dry and intact. No rash noted. Psychiatric: Mood and affect are normal. Speech and behavior are normal.  ____________________________________________   LABS     Component Ref Range & Units (hover) 3 d ago  Color, UA yellow  Clarity, UA clear  Glucose, UA Negative  Bilirubin, UA neg  Ketones, UA neg  Spec Grav, UA 1.020   Blood, UA neg  pH, UA 5.5  Protein, UA Negative  Urobilinogen, UA 0.2  Nitrite, UA neg  Leukocytes, UA Negative  Appearance dark  Odor none              Component Ref Range & Units (hover) 3 d ago (06/02/24) 7 mo ago (11/08/23) 7 mo ago (11/08/23) 7 mo ago (11/07/23) 7 mo ago (11/07/23) 7 mo ago (11/06/23) 7 mo ago (11/06/23)  Glucose 100 High  164 High  CM  132 High  CM  128 High  CM   Uric Acid 6.2        Comment:            Therapeutic target for gout patients: <6.0  BUN 25 19 R  25 High  R  25 High  R   Creatinine, Ser 1.30 High  0.93 R  0.91 R  1.10 R   eGFR 63        BUN/Creatinine Ratio 19        Sodium 137 134 Low  R  134 Low  R  133 Low  R   Potassium 4.0 3.6 R  3.5 R  3.5 R   Chloride 97 102 R  100 R  103 R   Calcium  9.9 8.9 R  9.3 R  10.0 R   Phosphorus 3.5        Total Protein 6.6        Albumin 4.8        Globulin, Total 1.8        Bilirubin Total 0.4        Alkaline Phosphatase 45        LDH 145        AST 22        ALT 22        GGT 16        Iron 80        Cholesterol, Total 71 Low         Triglycerides 120        HDL 40        VLDL Cholesterol Cal 22        LDL Chol Calc (NIH) 9        Chol/HDL Ratio 1.8        Comment:                                   T. Chol/HDL Ratio  Men  Women                               1/2 Avg.Risk  3.4    3.3                                   Avg.Risk  5.0    4.4                                2X Avg.Risk  9.6    7.1                                3X Avg.Risk 23.4   11.0  Estimated CHD Risk  < 0.5        Comment: The CHD Risk is based on the T. Chol/HDL ratio. Other factors affect CHD Risk such as hypertension, smoking, diabetes, severe obesity, and family history of premature CHD.  TSH 1.420        T4, Total 8.1        T3 Uptake Ratio 25        Free Thyroxine Index 2.0        Prostate Specific Ag, Serum 0.2        Comment: Roche ECLIA methodology. According to the  American Urological Association, Serum PSA should decrease and remain at undetectable levels after radical prostatectomy. The AUA defines biochemical recurrence as an initial PSA value 0.2 ng/mL or greater followed by a subsequent confirmatory PSA value 0.2 ng/mL or greater. Values obtained with different assay methods or kits cannot be used interchangeably. Results cannot be interpreted as absolute evidence of the presence or absence of malignant disease.  WBC 6.2  6.7 R  8.1 R  7.8 R  RBC 4.62  4.60 R  4.63 R  5.09 R  Hemoglobin 14.7  14.2 R  14.4 R  15.5 R  Hematocrit 42.3  41.0 R  40.8 R  43.6 R  MCV 92  89.1 R  88.1 R  85.7 R  MCH 31.8  30.9 R  31.1 R  30.5 R  MCHC 34.8  34.6 R  35.3 R  35.6 R  RDW 12.8  12.1 R  12.0 R  11.8 R  Platelets 179  157 R  180 R  183 R  Neutrophils 69        Lymphs 18        Monocytes 8        Eos 3        Basos 1        Neutrophils Absolute 4.4        Lymphocytes Absolute 1.1        Monocytes Absolute 0.5        EOS (ABSOLUTE) 0.2        Basophils Absolute 0.1        Immature Granulocytes 0        Immature Grans (Abs) 0.0        Resulting                      Component Ref Range & Units (hover) 3 d ago (06/02/24) 7 mo ago (11/02/23) 3 yr ago (12/29/20) 4 yr ago (02/17/20) 4  yr ago (12/09/19) 5 yr ago (06/05/19) 5 yr ago (01/15/19)  Hgb A1c MFr Bld 5.8 High  5.4 CM 7.2 High  CM 7.0 High  CM 6.7 High  R, CM 6.3 R, CM 7.2 High  R, CM  Comment:          Prediabetes: 5.7 - 6.4          Diabetes: >6.4          Glycemic control for adults with diabetes: <7.0  Resulting               ____________________________________________  EKG  Sinus rhythm at 63 bpm ____________________________________________    ____________________________________________   INITIAL IMPRESSION / ASSESSMENT AND PLAN   As part of my medical decision making, I reviewed the following data within the electronic MEDICAL RECORD NUMBER      No acute findings on physical  exam, EKG, labs.  Advised to follow-up with cardiology for optimal blood pressure control.        ____________________________________________   FINAL CLINICAL IMPRESSION Well exam   ED Discharge Orders     None        Note:  This document was prepared using Dragon voice recognition software and may include unintentional dictation errors.

## 2024-06-05 NOTE — Progress Notes (Signed)
 Pt presents today to complete physical, Pt denies any concerns at this time Gretel Acre

## 2024-06-11 DIAGNOSIS — I10 Essential (primary) hypertension: Secondary | ICD-10-CM | POA: Diagnosis not present

## 2024-06-18 DIAGNOSIS — M2242 Chondromalacia patellae, left knee: Secondary | ICD-10-CM | POA: Diagnosis not present

## 2024-06-18 DIAGNOSIS — M1711 Unilateral primary osteoarthritis, right knee: Secondary | ICD-10-CM | POA: Diagnosis not present

## 2024-06-18 DIAGNOSIS — M7122 Synovial cyst of popliteal space [Baker], left knee: Secondary | ICD-10-CM | POA: Diagnosis not present

## 2024-06-18 DIAGNOSIS — M2241 Chondromalacia patellae, right knee: Secondary | ICD-10-CM | POA: Diagnosis not present

## 2024-06-18 DIAGNOSIS — E119 Type 2 diabetes mellitus without complications: Secondary | ICD-10-CM | POA: Diagnosis not present

## 2024-06-18 DIAGNOSIS — M170 Bilateral primary osteoarthritis of knee: Secondary | ICD-10-CM | POA: Diagnosis not present

## 2024-06-18 DIAGNOSIS — S83241A Other tear of medial meniscus, current injury, right knee, initial encounter: Secondary | ICD-10-CM | POA: Diagnosis not present

## 2024-06-24 DIAGNOSIS — M1711 Unilateral primary osteoarthritis, right knee: Secondary | ICD-10-CM | POA: Diagnosis not present

## 2024-06-26 DIAGNOSIS — R079 Chest pain, unspecified: Secondary | ICD-10-CM | POA: Diagnosis not present

## 2024-06-26 DIAGNOSIS — I10 Essential (primary) hypertension: Secondary | ICD-10-CM | POA: Diagnosis not present

## 2024-06-26 DIAGNOSIS — K219 Gastro-esophageal reflux disease without esophagitis: Secondary | ICD-10-CM | POA: Diagnosis not present

## 2024-06-26 DIAGNOSIS — E119 Type 2 diabetes mellitus without complications: Secondary | ICD-10-CM | POA: Diagnosis not present

## 2024-06-26 DIAGNOSIS — I77810 Thoracic aortic ectasia: Secondary | ICD-10-CM | POA: Diagnosis not present

## 2024-06-26 DIAGNOSIS — Z7982 Long term (current) use of aspirin: Secondary | ICD-10-CM | POA: Diagnosis not present

## 2024-06-26 DIAGNOSIS — G4730 Sleep apnea, unspecified: Secondary | ICD-10-CM | POA: Diagnosis not present

## 2024-06-26 DIAGNOSIS — R001 Bradycardia, unspecified: Secondary | ICD-10-CM | POA: Diagnosis not present

## 2024-06-26 DIAGNOSIS — Z7984 Long term (current) use of oral hypoglycemic drugs: Secondary | ICD-10-CM | POA: Diagnosis not present

## 2024-06-26 DIAGNOSIS — I2510 Atherosclerotic heart disease of native coronary artery without angina pectoris: Secondary | ICD-10-CM | POA: Diagnosis not present

## 2024-06-26 DIAGNOSIS — I214 Non-ST elevation (NSTEMI) myocardial infarction: Secondary | ICD-10-CM | POA: Diagnosis not present

## 2024-06-26 DIAGNOSIS — E785 Hyperlipidemia, unspecified: Secondary | ICD-10-CM | POA: Diagnosis not present

## 2024-06-26 DIAGNOSIS — I252 Old myocardial infarction: Secondary | ICD-10-CM | POA: Diagnosis not present

## 2024-06-26 DIAGNOSIS — T82855A Stenosis of coronary artery stent, initial encounter: Secondary | ICD-10-CM | POA: Diagnosis not present

## 2024-06-26 DIAGNOSIS — Z955 Presence of coronary angioplasty implant and graft: Secondary | ICD-10-CM | POA: Diagnosis not present

## 2024-06-28 DIAGNOSIS — I77810 Thoracic aortic ectasia: Secondary | ICD-10-CM | POA: Insufficient documentation

## 2024-06-28 NOTE — Discharge Summary (Addendum)
 ------------------------------------------------------------------------------- Attestation signed by Brock Odell Ned, MD at 06/30/24 1605 Attending Physician Attestation:    I confirm that I interviewed and examined the patient, reviewed all the relevant lab and diagnostic data, and we agreed upon a plan of treatment. I agree with the findings, assessment, and plan as documented by Dr. Orlena.  I saw and evaluated the patient, participating in the key portions of the service on the day of discharge. I reviewed the resident's note and agree with the discharge plans and disposition. I personally spent over 30 minutes in discharge planning services.   Odell Brock, MD  -------------------------------------------------------------------------------    Cardiology Discharge Summary  Identifying Information: Kevin Reid [birth year] [id number]   Admit Date: 06/26/2024 Discharge Date: 06/28/2024  Discharge To: Home Discharge Service: Cardiology [facility]) Discharge Attending Physician: BROCK ODELL NED, MD Discharge Cardiology Fellow: Inge Floss, MD Primary Care Physician: Dr. Deberah Blake, MD   Discharge Diagnoses:   Principal Problem:   Type 1 non-ST elevation myocardial infarction (NSTEMI)    Active Problems:   Coronary artery disease   Primary hypertension   Type 2 diabetes mellitus      HLD (hyperlipidemia)   Mild dilation of ascending aorta    Outpatient Provider Follow Up Issues: [ ]  follow-up with cardiologist to optimize hypertensive medication regimen, recent hospitalization, and aortic dilation monitoring. [ ]  Follow-up with CYP2C19 genotyping  Hospital Course: Kevin Reid is a 63 y.o. male whose presentation is complicated by NSTEMI (10/2023) s/p PCI RCA and LAD, HTN, HLD, T2DM,  that presented to [facility] with chest pain and transferred to [facility] main for management of NSTEMI. Found to have type 1 NSTEMI s/p LHC 07/04 with PTCA of the distal  RCA and new diagnosis of mild dilation of ascending aorta. Patient is medically stable and ready for discharge on 07/05.      Active Problems   Type 1 NSTEMI (s/p PCI RCA and LAD 10/2023 and s/p PTCA of the distal RCA)  CAD LHC 10/2023 w. Prox LAD lesion 50% stenosed, mid LAD lesion 95% stenosed, distal LAD lesion 50% stenosed, and stent placed LAD, previous RCA stent patent. Last echo from 10/2023 w/ LVEF 50-55% (low normal function) and grade I diastolic dysfunction. Presented to [facility] ED w/ chest pain and elevated troponin (508 --> 1644 --> 3650 --> 4,942). Initiated treatment for NSTEMI w/ aspirin  load and heparin  gtt. Lipid panel significant for LDL of 14 (on repatha outpatient, taking once a week instead of once every 14 days), TSH normal. Follow up hgb A1c: 5.5. EKG 07/04: Sinus bradycardia, left axis deviation, t-wave inversions, poor R-wave progression. Patient was having persistent chest pain that was unchanged from last night with continually rising troponins, taken for LHC. S/p LHC 7/4 w/ Successful PTCA of the distal RCA. 1-vessel coronary artery disease: likely plaque rupture in the distal RCA stent. Normal LVEDP = 8 mmHg. Post-LHC, we discontinued heparin  gtt. DAPT with ASA 81 mg daily and ticagrelor  90 mg bid for 12 months, followed by [facility] with ticagrelor  90 mg bid indefinitely. Echo 07/04: The left ventricle is upper normal in size with mildly increased wall thickness. The left ventricular systolic function is normal, LVEF is visually estimated at 50-55%.    HTN Patient states blood pressure has been elevated the past few months and difficult to control. Outpatient regimen includes: -spironolactone 12.5 mg daily - hydrochlorothiazide  25 mg daily - bisoprolol  2.5 mg daily - telmisartan 80 mg.  Inpatient regimen:  - Started valsartan 160 mg daily (equivalent  of home telmisartan 80 mg daily) - Started Hydralazine 25 mg TID - HOLD BB d/t bradycardia - HOLD home spironolactone 12.5  mg daily - HOLD home hydrochlorothiazide  25 mg daily  On discharge: - Discontinued hydralazine 25 mg TID - Continue telmisartan 80 mg - Continue Bisoprolol  2.5 mg daily - Continue spironolactone 12.5 daily - Discontinue hydrochlorothiazide  25 mg - START Chlorthalidone 25 mg  Please follow-up with cardiologist to optimize hypertensive medication regimen.   Mild dilation of ascending aorta: - From ECHO 07/04: The aorta at the sinuses of Valsalva and ascending aorta is mildly dilated. SoV: 4.0 cm, ascending aorta: up to 4.5 cm. - Follow-up with cardiologist for monitoring of aorta dilation.   Type 2 DM Hgb A1c 6/3 on 03/29/2023. A1c on 06/26/2024: 5.5. Continue tirzepatide (mounjaro) 7.5 mg injection every 7 days/metformin  1000 mg PO BID outpatient   HLD: Lipid panel significant for LDL of 14 (on repatha outpatient), ordered lipoprotein a.   Procedures: LHC with Coronary Angiography and PTCA of the distal RCA  Discharge Day Services: BP 140/87   Pulse 57   Temp 36.5 C (97.7 F) (Oral)   Resp 18   Ht 180.3 cm (5' 11)   Wt 97 kg (213 lb 12.8 oz)   SpO2 95%   BMI 29.82 kg/m  Pt seen on the day of discharge and determined appropriate for discharge.   Cardiac Rehab Referral: Cardiac rehab was discussed with the patient: yes Referral was placed: yes. If no, document reason: referral placed Indication: Type 1 NSTEMI s/p balloon angioplasty to distal RCA  Condition at Discharge: good  Discharge Medications:   Your Medication List     STOP taking these medications    hydroCHLOROthiazide  25 MG tablet Commonly known as: HYDRODIURIL        START taking these medications    chlorthalidone 25 MG tablet Commonly known as: HYGROTON Take 1 tablet (25 mg total) by mouth every morning.       CONTINUE taking these medications    aspirin  81 MG tablet Commonly known as: ECOTRIN Take 1 tablet (81 mg total) by mouth daily.   bisoprolol  5 MG tablet Commonly known as:  ZEBETA  Take 0.5 tablets (2.5 mg total) by mouth daily.   evolocumab 140 mg/mL Pnij Inject 140 mg under the skin every fourteen (14) days.   fenofibrate  145 MG tablet Commonly known as: TRICOR  Take 1 tablet (145 mg total) by mouth daily.   ferrous sulfate 325 (65 FE) MG EC tablet Take 1 tablet (325 mg total) by mouth in the morning.   fexofenadine 180 MG tablet Commonly known as: ALLEGRA Take 1 tablet (180 mg total) by mouth daily.   metFORMIN  1000 MG tablet Commonly known as: GLUCOPHAGE  Take 1 tablet (1,000 mg total) by mouth in the morning and 1 tablet (1,000 mg total) in the evening. Take with meals.   PARoxetine  20 MG tablet Commonly known as: PAXIL  Take 1 tablet (20 mg total) by mouth every morning.   spironolactone 25 MG tablet Commonly known as: ALDACTONE Take 0.5 tablets (12.5 mg total) by mouth daily.   telmisartan 80 MG tablet Commonly known as: MICARDIS Take 1 tablet (80 mg total) by mouth daily.   ticagrelor  90 mg Tab Commonly known as: BRILINTA  Take 1 tablet (90 mg total) by mouth two (2) times a day.   tirzepatide 7.5 mg/0.5 mL Pnij Inject 7.5 mg under the skin every seven (7) days.        Recent Labs: Microbiology Results (last  day)     ** No results found for the last 24 hours. **      Lab Results  Component Value Date   WBC 5.7 06/28/2024   HGB 14.7 06/28/2024   HCT 40.6 06/28/2024   PLT 173 06/28/2024   Lab Results  Component Value Date   NA 137 06/28/2024   K 4.0 06/28/2024   CL 102 06/28/2024   CO2 25.0 06/28/2024   BUN 21 06/28/2024   CREATININE 1.10 06/28/2024   CALCIUM  9.6 06/28/2024   MG 1.8 06/28/2024   Lab Results  Component Value Date   ALKPHOS 38 (L) 06/26/2024   BILITOT 0.5 06/26/2024   PROT 7.2 06/26/2024   ALBUMIN 4.7 06/26/2024   ALT 26 06/26/2024   AST 26 06/26/2024   Lab Results  Component Value Date   PT 11.4 06/26/2024   INR 1.00 06/26/2024   APTT 50.7 (H) 06/27/2024   Radiology: Cath/Vascular  Procedure Result Date: 06/28/2024 FINAL CARDIAC CATHETERIZATION REPORT Date of Procedure: 06/27/2024 ___________________________________________________________________________ ___________________________  Findings: Coronary artery disease including in stent thrombosis/restenosis of up to 95% in the distal RCA and 50% stenosis of the distal LAD. Previously placed mid LAD stent and mid RCA stent are patent. Normal left-sided intracardiac filling pressures (LVEDP 8 mmHg). Successful PTCA of the distal RCA up to 2.25 mm with excellent angiographic result and TIMI 3 flow.  Recommendations:  Dual antiplatelet therapy with aspirin  and ticagrelor  for at least 12 months, followed by indefinite single antiplatelet therapy with ticagrelor . Aggressive secondary prevention with optimization of medical therapy and risk factor modification in accordance with ACC/AHA guidelines. Further management per the inpatient cardiology service. ___________________________________________________________________________ ___________________________ Patient Name: Kevin Reid MRN: [id number] Date of birth: 12/15/1961 Procedures Performed: Left heart catheterization Coronary angiography Percutaneous transluminal coronary angioplasty (PTCA) Performing Physicians: Interventional Cardiology Attending: Franky Cutting, MD Interventional Cardiology Fellow: Jacques Moulding, MD Referring Provider: Odell Grew, MD Indication: NSTEMI Brief HPI: Kevin Reid is a 63 y.o. male whose presentation is complicated by NSTEMI (10/2023) s/p PCI RCA and LAD, HTN, HLD, T2DM, that presented to [facility] with chest pain and transferred to [facility] main for management of NSTEMI. He who is referred to the cardiac catheterization lab for further ischemic evaluation via coronary angiography with possible percutaneous coronary intervention. Technique: After risks and benefits were explained and procedure consent was obtained, the patient was brought to the cardiac  catheterization lab and prepped and draped in a sterile fashion. Time-out was performed immediately prior to the procedure. Topical lidocaine  was administered to the access site. The right radial artery was accessed using an angiocath and a 92F sheath was placed. 3 mg of verapamil  was administered via the sheath. After accessing the ascending aorta, heparin  was administered. Coronary angiography was then performed using a JR 4 catheter to engage the right coronary artery, and a JL 3.5 catheter to engage the left coronary artery. At the end of the procedure, an additional 2 mg of verapamil  was administered via the sheath prior to its removal. Then, a radial compression device was placed over the access site in order to achieve hemostasis. The patient tolerated the procedure well without complications. Hemodynamics: Ao pressure: 142/85 mm Hg LVEDP Left ventriculogram: Not performed No significant transvalvular gradient noted on catheter pullback across the aortic valve Coronary Angiography: Coronary Dominance: Right Left main (LMCA): The LMCA is a normal-caliber vessel that bifurcates into the left anterior descending (LAD) and left circumflex (LCx) arteries. There is no significant stenosis in the LMCA.  Left anterior descending (LAD): The LAD is a normal-caliber vessel that supplies 3 diagonal (D) branches as it travels to the apex. There is mild luminal irregularity in the proximal LAD. Previously placed mid LAD stent is patent with mild insten restenosis of up to 20%. There is distal LAD stenosis of up to 50%. Left circumflex (LCx): The LCx is a normal-caliber vessel that supplies 2 obtuse marginal (OM) branches and then continues as a small vessel in the AV groove. There is a small OM1 and a large branching OM2 with mild luminal irregularities. Right coronary artery (RCA): The RCA is a normal-caliber vessel that bifurcates distally into a posterior descending artery (RPDA) and posterolateral (RPL) branch.  There is mild instent restenosis of the mid RCA stent with up to 20% stenosis. There is 95% instent restenosis of the distal RCA. Percutaneous Transluminal Coronary Angioplasty (PTCA): The decision was made to proceed with PTCA of the distal RCA. Lesion: distal RCA    Antithrombotic regimen: unfractionated heparin , aspirin , ticagrelor  Guide Catheter: 68F JR 4 Guide Wire: 0.014 Runthrough Balloon angioplasty: Sapphire 2x35mm (10 ATM) and Sapphire 2.25x105mm (10 ATM) Results: Reduction of initial 95% stenosis to 0% residual stenosis with no evidence of dissection, perforation, distal embolization, or loss of side branch with excellent angiographic result and TIMI 3 flow. Access site: right radial artery Closure: TR band Catheters: 68F JR 4, 68F JL 3.5 Contrast: 110 mL Fluoroscopy time: 11.4 minutes Radiation dose: 585 mGy Estimated blood loss: < 20 mL Complications: None ___________________________________________________________________________ ___________________________ I have reviewed the recent history physical and documentation. I personally spent 39 minutes continuously monitoring the patient face to face during the administration of moderate sedation. Independent observer RN was present for the duration of the procedure to assist in patient monitoring. Pre- and post- sedation activities have been reviewed. I was present for the entire duration of the procedure, as detailed above. Franky FELIX Gil, MD, [facility], [facility] Assistant Professor of Medicine Division of Cardiology [facility]   ECG 12 Lead Result Date: 06/27/2024 SINUS BRADYCARDIA LEFT AXIS DEVIATION MODERATE VOLTAGE CRITERIA FOR LVH, MAY BE NORMAL VARIANT ( R in aVL , [employer] product ) NONSPECIFIC T WAVE ABNORMALITY ABNORMAL ECG WHEN COMPARED WITH ECG OF 27-Jun-2024 07:06, NO SIGNIFICANT CHANGE WAS FOUND  ECG 12 Lead Result Date: 06/27/2024 SINUS BRADYCARDIA MODERATE VOLTAGE CRITERIA FOR LVH, MAY BE NORMAL VARIANT ( R in aVL ,  [employer] product ) NONSPECIFIC T WAVE ABNORMALITY ABNORMAL ECG WHEN COMPARED WITH ECG OF 27-Jun-2024 01:40, NO SIGNIFICANT CHANGE WAS FOUND  ECG 12 Lead Result Date: 06/27/2024 SINUS BRADYCARDIA LEFT AXIS DEVIATION LEFT VENTRICULAR HYPERTROPHY WITH QRS WIDENING ( R in aVL , [employer] product ) ABNORMAL ECG WHEN COMPARED WITH ECG OF 27-Jun-2024 07:06, NO SIGNIFICANT CHANGE WAS FOUND  Echocardiogram W Colorflow Spectral Doppler Result Date: 06/27/2024 Patient Info Name:     Kevin Reid Age:     26 years DOB:     11-02-1961 Gender:     Male MRN:     [id number] Accession #:     [id number] UN Account #:     [id number] Ht:     180 cm Wt:     98 kg BSA:     2.24 m2 BP:     144 /     96 mmHg HR:     49 bpm Exam Date:     06/27/2024 8:42 AM Admit Date:     06/26/2024 Exam Type:     ECHOCARDIOGRAM W COLORFLOW  SPECTRAL DOPPLER Technical Quality:     Fair Staff Sonographer:     Montie Furnish Ordering Physician:     Damien MARLA Grey Study Info Indications      - nstemi Procedure(s)   Complete two-dimensional, color flow and Doppler transthoracic echocardiogram is performed. Three dimensional echocardiographic imaging is performed during the transthoracic echocardiogram. Strain analysis performed. Summary   1. The left ventricle is upper normal in size with mildly increased wall thickness.   2. The left ventricular systolic function is normal, LVEF is visually estimated at 50-55%.   3. The aortic valve is trileaflet with mildly thickened leaflets with normal excursion.   4. The left atrium is mildly dilated in size.   5. The right ventricle is normal in size, with normal systolic function.   6. The aorta at the sinuses of Valsalva and ascending aorta is mildly dilated. SoV: 4.0 cm, ascending aorta: up to 4.5 cm. Left Ventricle   The left ventricle is upper normal in size with mildly increased wall thickness. The left ventricular systolic function is normal, LVEF is visually estimated at 50-55%. There is normal left  ventricular diastolic function. LV global longitudinal strain: -15.4 %. Right Ventricle   The right ventricle is normal in size, with normal systolic function. Left Atrium   The left atrium is mildly dilated in size. Right Atrium   The right atrium is normal in size. Aortic Valve   The aortic valve is trileaflet with mildly thickened leaflets with normal excursion. There is no significant aortic regurgitation. There is no evidence of a significant transvalvular gradient. Mitral Valve   The mitral valve leaflets are normal with normal leaflet mobility. There is trivial mitral valve regurgitation. Tricuspid Valve   The tricuspid valve leaflets are normal, with normal leaflet mobility. There is no significant tricuspid regurgitation. The pulmonary systolic pressure cannot be estimated due to insufficient TR signal. Pulmonic Valve   Pulmonary valve is not well visualized. There is trivial pulmonic regurgitation. There is no evidence of a significant transvalvular gradient. Aorta   The aorta at the sinuses of Valsalva and ascending aorta is mildly dilated. SoV: 4.0 cm, ascending aorta: up to 4.5 cm. Inferior Vena Cava   IVC size and inspiratory change suggest normal right atrial pressure. (0-5 mmHg). Pericardium/Pleural   There is no pericardial effusion. Ventricles ---------------------------------------------------------------------- Name                                 Value        Normal ---------------------------------------------------------------------- LV Dimensions 2D/MM ----------------------------------------------------------------------  IVS Diastolic Thickness (2D)                                1.0 cm       0.6-1.0 LVID Diastole (2D)                  5.9 cm       4.2-5.8  LVPW Diastolic Thickness (2D)                                1.1 cm       0.6-1.0 LVID Systole (2D)                   3.4 cm       2.5-4.0 LV Mass Index (  2D Cubed)          114 g/m2        49-115  Relative Wall Thickness (2D)                                   0.37        <=0.42 LV Function ---------------------------------------------------------------------- Global Longitudinal Strain         -15.4 %               RV Dimensions 2D/MM ----------------------------------------------------------------------  RV Basal Diastolic Dimension                           2.9 cm       2.5-4.1 TAPSE                               2.2 cm         >=1.7 Atria ---------------------------------------------------------------------- Name                                 Value        Normal ---------------------------------------------------------------------- LA Dimensions ---------------------------------------------------------------------- LA Dimension (2D)                   4.8 cm       3.0-4.1 LA Volume Index (4C A-L)        40.85 ml/m2               LA Volume Index (2C A-L)        38.53 ml/m2               LA Volume (BP MOD)                   87 ml               LA Volume Index (BP MOD)        38.87 ml/m2   16.00-34.00 RA Dimensions ---------------------------------------------------------------------- RA Area (4C)                      14.2 cm2        <=18.0 RA Area (4C) Index              6.3 cm2/m2               RA ESV Index (4C MOD)             14 ml/m2         18-32 Aortic Valve ---------------------------------------------------------------------- Name                                 Value        Normal ---------------------------------------------------------------------- AV Doppler ---------------------------------------------------------------------- AV Peak Velocity                   1.1 m/s               AV Peak Gradient                    5 mmHg Mitral Valve ---------------------------------------------------------------------- Name  Value        Normal ---------------------------------------------------------------------- MV Diastolic Function ---------------------------------------------------------------------- MV E Peak  Velocity                 44 cm/s               MV A Peak Velocity                 49 cm/s               MV E/A                                 0.9               MV Annular TDI ---------------------------------------------------------------------- MV Septal e' Velocity             3.9 cm/s         >=8.0 MV E/e' (Septal)                      11.3               MV Lateral e' Velocity            4.6 cm/s        >=10.0 MV E/e' (Lateral)                      9.5               MV e' Average                     4.3 cm/s               MV E/e' (Average)                     10.4 Tricuspid Valve ---------------------------------------------------------------------- Name                                 Value        Normal ---------------------------------------------------------------------- Estimated PAP/RSVP ---------------------------------------------------------------------- RA Pressure                         3 mmHg           <=5 Pulmonic Valve ---------------------------------------------------------------------- Name                                 Value        Normal ---------------------------------------------------------------------- PV Doppler ---------------------------------------------------------------------- PV Peak Velocity                   0.8 m/s Aorta ---------------------------------------------------------------------- Name                                 Value        Normal ---------------------------------------------------------------------- Ascending Aorta ---------------------------------------------------------------------- Ao Root Diameter (2D)               4.0 cm               Ao Root Diam Index (2D)          1.8 cm/m2  Ascending Aorta Diameter            4.5 cm Venous ---------------------------------------------------------------------- Name                                 Value        Normal ---------------------------------------------------------------------- IVC/SVC  ---------------------------------------------------------------------- IVC Diameter (Exp 2D)               1.8 cm         <=2.1 QLAB ---------------------------------------------------------------------- Name                                 Value        Normal ---------------------------------------------------------------------- 3DQ ---------------------------------------------------------------------- EDV 3DA                             169 ml               ESV 3DA                              78 ml               EF 3DA                                54 %               3DQA ---------------------------------------------------------------------- Stroke Volume 3DQA                   90 ml               Heart Model ---------------------------------------------------------------------- LV Length ED HM                     9.0 cm               LV Length ES HM                     7.6 cm               LV Mass HM                           176 g               LV CI HM                        2.02 l/min/m2               AutoStrain ---------------------------------------------------------------------- LV A2C Peak Strain                 -15.7 %               LV A3C Peak Strain                 -14.7 %               LV A4C Peak Strain                 -15.7 %  LV Peak GLS                        -15.4 % Report Signatures Finalized by Altamese Cathlean Presume on 06/27/2024 02:26 PM  ECG 12 Lead Result Date: 06/27/2024 SINUS BRADYCARDIA LEFT AXIS DEVIATION MODERATE VOLTAGE CRITERIA FOR LVH, MAY BE NORMAL VARIANT ( R in aVL , [employer] product ) ABNORMAL ECG WHEN COMPARED WITH ECG OF 26-Jun-2024 19:27, NO SIGNIFICANT CHANGE WAS FOUND Confirmed by Claudene Legions ([id number]) on 06/27/2024 9:42:12 AM  ECG 12 Lead Result Date: 06/27/2024 SINUS BRADYCARDIA MINIMAL VOLTAGE CRITERIA FOR LVH, MAY BE NORMAL VARIANT ( [employer] product ) NONSPECIFIC T WAVE ABNORMALITY ABNORMAL ECG NO PREVIOUS ECGS AVAILABLE Confirmed by Claudene Legions ([id number]) on  06/27/2024 9:41:03 AM  XR Chest 2 views Result Date: 06/26/2024 EXAM: XR CHEST 2 VIEWS ACCESSION: [id number] UN REPORT DATE: 06/26/2024 8:12 PM CLINICAL INDICATION: CHEST PAIN ; Chest Pain  TECHNIQUE: PA and Lateral Chest Radiographs COMPARISON: None FINDINGS: Lungs are clear.  No pleural effusion or pneumothorax. Normal heart size and mediastinal contours.   No acute cardiopulmonary abnormality.    Discharge Instructions: Activity Instructions     Activity as tolerated         Other Instructions     Call MD for:  persistent nausea or vomiting     Call MD for:  severe uncontrolled pain     Call MD for: Temperature > 38.5 Celsius ( > 101.3 Fahrenheit)     Discharge instructions     DIAGNOSIS  -- You were admitted to [facility] with chest pain and found to have a myocardial infarction (a heart attack). This heart attack was caused by a blockage of a heart artery, leading to reduced blood flow to the heart muscle. We treated you a procedure called balloon angioplasty, which opened a narrowed section of one of your heart arteries using a small balloon, restoring blood flow to the heart muscle. The procedure went well, and you are now safe to go home. 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 This is not an emergency, but it does require regular monitoring to ensure it does not get worse.  Here is what you can do to lower your risk of any future heart problems:   1. Now that you have had the procedure on your heart, you must take your blood thinners (Aspirin  and Ticagrelor  (Brilinta )) every day for the next 12 months, then after 12 months, your cardiologist will switch you to only one of these medications. Missing doses frequently could allow a clot to form and block your heart arteries.    2. Follow-up with your PCP to discuss diabetes/high  cholesterol/hypertension management and monitoring. You can ask your PCP about way to optimize your overall health and follow-up regarding your most recent hospitalization.   3. Follow-up with your Cardiologist on 06/30/2024 in the outpatient setting regarding your most recent hospital stay with us  and optimizing your hypertensive medication regimen, checking in on your heart health/function, and monitoring of the dilation of the aorta.   Please take your medications as prescribed. A full list of medications with any changes is in this discharge packet. Please keep your follow-up appointments after the hospital for ongoing care. It has been a pleasure taking care of you, we wish you the best.   MEDICATIONS  --  Please refer to your after visit summary (AVS) medication list for updates or changes related to your hospital stay.  -- Common side effects for your aspirin   include: heartburn, stomach upset, bleeding, bruising, stomach ulcers, allergic reactions -- Common side effects for your ticagrelor   include: Bleeding, bruising, shortness of breath, dizziness, nausea, diarrhea, allergic reactions -- Common side effects for your Chlorthalidone  include: Dizziness, headache, nausea, vomiting, electrolyte imbalances, low blood pressure, kidney problems, allergic reactions   SIGNS AND SYMPTOMS TO MONITOR  -- If you begin to experience Black stools , Severe dizziness , Vomiting blood , Chest pain/pressure, Shortness of breath , Slurred speech , Sudden vision changes , Weakness or numbness on one side of the body , Head injuries or loss of consciousness , or Fainting, confusion, or seizures  call 911 or go to the emergency department.    ACTIVITY AND DIETARY GUIDANCE  -- Try to limit the amount of sodium you intake and please contact PCP or Cardiologist if experiencing shortness of breath or leg swelling. Please watch out for any bleeding where they went in with a wire. Avoid putting too much strain on  these sites while they heal. They should heal well on their own over time. I have written a return to work letter for you to return to work in one week with light duties.   IF HAVING CATH OR MI OR HF EXACERBATION USE DOT PHRASE!   FOLLOW-UP APPOINTMENTS Your Primary Care Doctor is listed as Dr. Deberah Blake, MD. Your follow-up appointment is pending (our schedulers are working on it). Your cardiologist appointment is scheduled for 06/30/2024.  Please see the front page of your after visit summary (AVS) for your scheduled [facility] appointments and outstanding referrals.   You have been referred to the following specialties listed below. If you miss your appointment or do not hear from them please call them at the following number:   [facility] [phone]   EMERGENCY CONTACT INFORMATION  -- If you have additional questions about your hospitalization please call [phone]. If needed, you can ask to discuss your symptoms or questions with the resident physician.  -- If it has been more than 7 days since your hospitalization, please reach out to your primary care physician for further questions and guidance.   To keep your arm from bleeding and/or swelling:  * Rest the arm where the catheter was placed for 24-48 hours after your procedure -- Limit the moving of your hand and wrist -- Elevate your arm on a pillow when sitting in a chair -- Do not lift anything over 5 pounds for 48 hours  * Do not soak your arm where the catheter was placed for 48 hours to prevent infection -- No dishwashing without long rubber gloves -- No swimming -- No tub baths  * Do not drive for 24 hours after your procedure    Eating and Drinking  * Drink extra fluids over the next 48 hours unless you are usually limited by your doctor. This helps flush the dye from your kidneys  * Eat as normal    Site Care  * You can take off your dressing 24 hours after your procedure  * You can take a shower the  day after your procedure but try to keep your arm dry  * You may wash your skin where the catheter was placed (this is called your site) with soap and water and pat it dry  * Put on a band-aid  if you want after showering  * Keep the site clean and dry  * Bruising is normal    Further Instructions  * If the site begins to bleed or swell: -- Lie down and place a clean cloth or bandage on the site. -- Apply and hold firm pressure by pressing down with your hand on the bandage/clean cloth -- If the bleeding or swelling continues, even with the firm pressure, call 911 and keep holding pressure    Call your doctor if: -- Your arm has signs of infection: redness, drainage, or is hot to touch -- Your arm becomes cold, numb, or painful -- The skin on your arm changes color or is white (pale) -- You have new/worsening chest discomfort or any other concerning symptoms  * You may drive a car the day after your procedure. If your procedure was earlier today, someone else must drive you home.  * Return to your normal activities -- Walk as normal -- Get out of bed as normal -- Do not do strenuous activities for the first 48 hours (bike riding, running, etc.)  If you have questions about nursing or medical care,  call the Cardiac Cath Lab at [phone]  Monday - Friday 8am - 5pm; at night or on weekends, you may call [facility] at [phone] and ask for the Cardiology doctor on call       Follow Up instructions and Outpatient Referrals    Call MD for:  persistent nausea or vomiting     Call MD for:  severe uncontrolled pain     Call MD for: Temperature > 38.5 Celsius ( > 101.3 Fahrenheit)     Discharge instructions      Cardiology appointment on 06/30/2024 at 10:00 am with Vicci Rosaline Glatter, NP. [address] [place] [address] Phone: [phone] Fax: [phone]  Length of Discharge: I spent less than 30 mins in the discharge of  this patient.

## 2024-07-03 DIAGNOSIS — I2510 Atherosclerotic heart disease of native coronary artery without angina pectoris: Secondary | ICD-10-CM | POA: Diagnosis not present

## 2024-07-03 DIAGNOSIS — I10 Essential (primary) hypertension: Secondary | ICD-10-CM | POA: Diagnosis not present

## 2024-07-03 DIAGNOSIS — E1169 Type 2 diabetes mellitus with other specified complication: Secondary | ICD-10-CM | POA: Diagnosis not present

## 2024-07-03 DIAGNOSIS — I214 Non-ST elevation (NSTEMI) myocardial infarction: Secondary | ICD-10-CM | POA: Diagnosis not present

## 2024-07-03 DIAGNOSIS — E7849 Other hyperlipidemia: Secondary | ICD-10-CM | POA: Diagnosis not present

## 2024-07-07 DIAGNOSIS — I2510 Atherosclerotic heart disease of native coronary artery without angina pectoris: Secondary | ICD-10-CM | POA: Diagnosis not present

## 2024-07-07 DIAGNOSIS — I10 Essential (primary) hypertension: Secondary | ICD-10-CM | POA: Diagnosis not present

## 2024-07-07 DIAGNOSIS — E1100 Type 2 diabetes mellitus with hyperosmolarity without nonketotic hyperglycemic-hyperosmolar coma (NKHHC): Secondary | ICD-10-CM | POA: Diagnosis not present

## 2024-07-07 DIAGNOSIS — E782 Mixed hyperlipidemia: Secondary | ICD-10-CM | POA: Diagnosis not present

## 2024-07-07 DIAGNOSIS — R0789 Other chest pain: Secondary | ICD-10-CM | POA: Diagnosis not present

## 2024-07-07 NOTE — Care Plan (Signed)
   [facility] 30 DAY TRANSITION FOLLOW-UP CALL    Attempted to contact patient today to follow up in the 30 Day [facility] Transitions Program for Acute Myocardial Infarction (AMI). Patient unable to be contacted. Left message to return call.; 1st attempt. Day 5  DAWNYETTA L TAYLOR, RN  [phone]

## 2024-07-22 DIAGNOSIS — I10 Essential (primary) hypertension: Secondary | ICD-10-CM | POA: Diagnosis not present

## 2024-08-11 DIAGNOSIS — G4733 Obstructive sleep apnea (adult) (pediatric): Secondary | ICD-10-CM | POA: Diagnosis not present

## 2024-08-11 DIAGNOSIS — G4731 Primary central sleep apnea: Secondary | ICD-10-CM | POA: Diagnosis not present

## 2024-08-21 DIAGNOSIS — E1169 Type 2 diabetes mellitus with other specified complication: Secondary | ICD-10-CM | POA: Diagnosis not present

## 2024-08-21 DIAGNOSIS — Z1330 Encounter for screening examination for mental health and behavioral disorders, unspecified: Secondary | ICD-10-CM | POA: Diagnosis not present

## 2024-08-21 DIAGNOSIS — I10 Essential (primary) hypertension: Secondary | ICD-10-CM | POA: Diagnosis not present

## 2024-08-21 DIAGNOSIS — Z1331 Encounter for screening for depression: Secondary | ICD-10-CM | POA: Diagnosis not present

## 2024-08-21 DIAGNOSIS — I2510 Atherosclerotic heart disease of native coronary artery without angina pectoris: Secondary | ICD-10-CM | POA: Diagnosis not present

## 2024-08-21 DIAGNOSIS — Z1211 Encounter for screening for malignant neoplasm of colon: Secondary | ICD-10-CM | POA: Diagnosis not present

## 2024-08-21 DIAGNOSIS — Z125 Encounter for screening for malignant neoplasm of prostate: Secondary | ICD-10-CM | POA: Diagnosis not present

## 2024-08-21 DIAGNOSIS — R0609 Other forms of dyspnea: Secondary | ICD-10-CM | POA: Diagnosis not present

## 2024-08-26 DIAGNOSIS — R0609 Other forms of dyspnea: Secondary | ICD-10-CM | POA: Diagnosis not present

## 2024-08-26 DIAGNOSIS — I10 Essential (primary) hypertension: Secondary | ICD-10-CM | POA: Diagnosis not present

## 2024-08-26 DIAGNOSIS — Z125 Encounter for screening for malignant neoplasm of prostate: Secondary | ICD-10-CM | POA: Diagnosis not present

## 2024-08-26 DIAGNOSIS — E1169 Type 2 diabetes mellitus with other specified complication: Secondary | ICD-10-CM | POA: Diagnosis not present

## 2024-10-20 DIAGNOSIS — I10 Essential (primary) hypertension: Secondary | ICD-10-CM | POA: Diagnosis not present

## 2024-10-20 DIAGNOSIS — I7121 Aneurysm of the ascending aorta, without rupture: Secondary | ICD-10-CM | POA: Diagnosis not present

## 2024-10-20 DIAGNOSIS — E782 Mixed hyperlipidemia: Secondary | ICD-10-CM | POA: Diagnosis not present

## 2024-10-20 DIAGNOSIS — I214 Non-ST elevation (NSTEMI) myocardial infarction: Secondary | ICD-10-CM | POA: Diagnosis not present

## 2024-10-20 DIAGNOSIS — I2510 Atherosclerotic heart disease of native coronary artery without angina pectoris: Secondary | ICD-10-CM | POA: Diagnosis not present

## 2024-11-10 DIAGNOSIS — G4731 Primary central sleep apnea: Secondary | ICD-10-CM | POA: Diagnosis not present

## 2024-11-10 DIAGNOSIS — G4733 Obstructive sleep apnea (adult) (pediatric): Secondary | ICD-10-CM | POA: Diagnosis not present

## 2024-11-26 DIAGNOSIS — E1169 Type 2 diabetes mellitus with other specified complication: Secondary | ICD-10-CM | POA: Diagnosis not present

## 2024-11-26 DIAGNOSIS — I10 Essential (primary) hypertension: Secondary | ICD-10-CM | POA: Diagnosis not present

## 2024-11-26 DIAGNOSIS — Z23 Encounter for immunization: Secondary | ICD-10-CM | POA: Diagnosis not present
